# Patient Record
Sex: Female | Born: 1965
Health system: Southern US, Community
[De-identification: ages and names within clinical notes are randomized; demographics above are authoritative.]

## PROBLEM LIST (undated history)

## (undated) DIAGNOSIS — E119 Type 2 diabetes mellitus without complications: Secondary | ICD-10-CM

## (undated) DIAGNOSIS — E7800 Pure hypercholesterolemia, unspecified: Secondary | ICD-10-CM

## (undated) DIAGNOSIS — S82891A Other fracture of right lower leg, initial encounter for closed fracture: Secondary | ICD-10-CM

## (undated) DIAGNOSIS — J45909 Unspecified asthma, uncomplicated: Secondary | ICD-10-CM

## (undated) DIAGNOSIS — F329 Major depressive disorder, single episode, unspecified: Secondary | ICD-10-CM

## (undated) DIAGNOSIS — C50919 Malignant neoplasm of unspecified site of unspecified female breast: Secondary | ICD-10-CM

## (undated) DIAGNOSIS — I10 Essential (primary) hypertension: Secondary | ICD-10-CM

## (undated) DIAGNOSIS — E282 Polycystic ovarian syndrome: Secondary | ICD-10-CM

## (undated) DIAGNOSIS — Z9889 Other specified postprocedural states: Secondary | ICD-10-CM

## (undated) DIAGNOSIS — F419 Anxiety disorder, unspecified: Secondary | ICD-10-CM

## (undated) DIAGNOSIS — Z9079 Acquired absence of other genital organ(s): Secondary | ICD-10-CM

## (undated) DIAGNOSIS — Z87442 Personal history of urinary calculi: Secondary | ICD-10-CM

## (undated) DIAGNOSIS — R928 Other abnormal and inconclusive findings on diagnostic imaging of breast: Secondary | ICD-10-CM

## (undated) DIAGNOSIS — R87619 Unspecified abnormal cytological findings in specimens from cervix uteri: Secondary | ICD-10-CM

## (undated) DIAGNOSIS — J302 Other seasonal allergic rhinitis: Secondary | ICD-10-CM

## (undated) DIAGNOSIS — R112 Nausea with vomiting, unspecified: Secondary | ICD-10-CM

## (undated) DIAGNOSIS — F32A Depression, unspecified: Secondary | ICD-10-CM

## (undated) DIAGNOSIS — C801 Malignant (primary) neoplasm, unspecified: Secondary | ICD-10-CM

## (undated) DIAGNOSIS — S82839A Other fracture of upper and lower end of unspecified fibula, initial encounter for closed fracture: Secondary | ICD-10-CM

## (undated) DIAGNOSIS — Z923 Personal history of irradiation: Secondary | ICD-10-CM

## (undated) DIAGNOSIS — G4730 Sleep apnea, unspecified: Secondary | ICD-10-CM

## (undated) HISTORY — DX: Major depressive disorder, single episode, unspecified: F32.9

## (undated) HISTORY — DX: Unspecified abnormal cytological findings in specimens from cervix uteri: R87.619

## (undated) HISTORY — PX: CERVICAL CERCLAGE: SHX1329

## (undated) HISTORY — PX: BREAST LUMPECTOMY: SHX2

## (undated) HISTORY — PX: CARPAL TUNNEL RELEASE: SHX101

## (undated) HISTORY — DX: Other fracture of upper and lower end of unspecified fibula, initial encounter for closed fracture: S82.839A

## (undated) HISTORY — DX: Depression, unspecified: F32.A

## (undated) HISTORY — DX: Other fracture of right lower leg, initial encounter for closed fracture: S82.891A

## (undated) HISTORY — DX: Anxiety disorder, unspecified: F41.9

---

## 1994-04-01 HISTORY — PX: CHOLECYSTECTOMY, LAPAROSCOPIC: SHX56

## 1999-07-16 ENCOUNTER — Ambulatory Visit (HOSPITAL_COMMUNITY): Admission: RE | Admit: 1999-07-16 | Discharge: 1999-07-16 | Payer: Self-pay | Admitting: *Deleted

## 1999-07-16 ENCOUNTER — Encounter: Payer: Self-pay | Admitting: *Deleted

## 2003-11-08 ENCOUNTER — Other Ambulatory Visit: Admission: RE | Admit: 2003-11-08 | Discharge: 2003-11-08 | Payer: Self-pay | Admitting: *Deleted

## 2003-11-08 ENCOUNTER — Ambulatory Visit (HOSPITAL_COMMUNITY): Admission: RE | Admit: 2003-11-08 | Discharge: 2003-11-08 | Payer: Self-pay | Admitting: *Deleted

## 2004-08-31 ENCOUNTER — Ambulatory Visit (HOSPITAL_COMMUNITY): Admission: RE | Admit: 2004-08-31 | Discharge: 2004-08-31 | Payer: Self-pay | Admitting: Pediatrics

## 2006-04-09 ENCOUNTER — Ambulatory Visit (HOSPITAL_BASED_OUTPATIENT_CLINIC_OR_DEPARTMENT_OTHER): Admission: RE | Admit: 2006-04-09 | Discharge: 2006-04-09 | Payer: Self-pay | Admitting: Orthopedic Surgery

## 2006-05-07 ENCOUNTER — Ambulatory Visit (HOSPITAL_BASED_OUTPATIENT_CLINIC_OR_DEPARTMENT_OTHER): Admission: RE | Admit: 2006-05-07 | Discharge: 2006-05-07 | Payer: Self-pay | Admitting: Orthopedic Surgery

## 2007-07-31 ENCOUNTER — Encounter: Admission: RE | Admit: 2007-07-31 | Discharge: 2007-07-31 | Payer: Self-pay | Admitting: Pediatrics

## 2009-12-06 ENCOUNTER — Encounter: Admission: RE | Admit: 2009-12-06 | Discharge: 2009-12-06 | Payer: Self-pay | Admitting: Pediatrics

## 2010-08-17 NOTE — H&P (Signed)
NAME:  Nicole Khan, Nicole Khan NO.:  [id number]   MEDICAL RECORD NO.:  [id number]                    PATIENT TYPE:   LOCATION:                                       FACILITY:   PHYSICIAN:  Langley Gauss, M.D.                DATE OF BIRTH:   DATE OF ADMISSION:  11/08/2003  DATE OF DISCHARGE:                                HISTORY & PHYSICAL   HISTORY OF PRESENT ILLNESS:  The patient is a 45 year old, gravida 3, para 2-  0-1-2, who contacted our office the a.m. of November 04, 2003, and left a  message with a chief complaint of passing a big clot.  Actually what  happened is she had a very mucousy, stringy discharge x 1 on this a.m., the  consistency of which was described as snotty in its nature.  The patient  apparently has restarted Ortho Tri-Cyclen Lo since February of 2005.  She  takes it on a daily basis, however, during this most recent two months on  two successive weeks she did have one  missed day of pills.  She currently  is on the menstrual week of her pill pack, but would not be expected to  start a normal menses for several days' duration.  The patient does have a  longstanding history of oligomenorrhea and amenorrhea when not on pills,  however, has no prior history of infertility.  The patient recently  restarted the birth control pills in February of 2005 to use now for birth  control purposes.   The patient had been contacted by office staff.  The patient provided  additional history that two days previously she felt as though she had been  leaking urine.  She was wearing a pad all day long, which was saturated.  She denies any continued wetness today.  She denies any fever.  Denies any  vaginal bleeding.  Denies any malodorous discharge.  After the previous  discussion with the patient, she was appropriately given advice to present  today to our office for evaluation.   Additional very recent history is pertinent for being followed in Dr.  Francoise Schaumann. Halm's office.  She has been notified that she had borderline hyperactive  thyroid, however, this is being followed clinically at this time.  No  sonographic or radio nucleotide studies have been performed.  She has  minimal tremors, but does have some occasional palpitations and some heat  intolerance, as well as increased frequency of her migraine headaches.  Pertinent recent history is under the care of Francoise Schaumann. Halm, D.O.  The  patient was started on Mevacor on June 01, 2003, for elevated cholesterol.  The patient is also on Avapro since April 03, 2003.  The patient does have  a longstanding history of chronic hypertension during preceding pregnancy  and was treated with labetalol postpartum and has to this point always been  continued on antihypertensive medication.   REVIEW OF SYSTEMS:  Pertinent for a recent 5-pound weight loss.  The patient  denies any breast tenderness.  She denies any significant nausea.  She  denies any abdominal protuberance.   BIRTH CONTROL:  She has been on the Ortho Tri-Cyclen Lo since February of  2005.  She has most recently only had episodes of a single missed pill day  with promptly doubling up on the following day.  They had previously  considered having another child, but as of February of 2005 when the patient  decided to restart the birth control pills, it was for birth control  purposes.  She and her husband have likewise in the meantime been discussing  tubal ligation versus him having a vasectomy.  The couple is absolutely  certain that they do not want any future childbearing potential.   ALLERGIES:  She has no known drug allergies.   OBSTETRICAL HISTORY:  On March 13, 1988, vaginal delivery at [redacted] weeks  gestation of a 5 pound infant.  That pregnancy had been complicated by  preterm labor.  In June of 1994, the patient underwent delivery at [redacted] weeks  gestation.  The history had been reviewed by myself and it was determined   that it was consistent with incompetent cervix.  Thus when I cared for her  pregnancy delivered on December 02, 1999, a cerclage had been placed at [redacted]  weeks gestation.  This was successful in that the patient progressed to  term, at which time with a footling breech presentation the cerclage was  removed and the patient successfully was delivered a term infant, the child  of which now is doing very well.   SOCIAL HISTORY:  The patient is a nonsmoker.  Her husband is employed by  [employer] in the [employer].   PHYSICAL EXAMINATION:  GENERAL APPEARANCE:  On physical examination, the  patient initially was somewhat apprehensive, stating that she knew that  something was wrong in that she had had breakthrough bleeding previously.  VITAL SIGNS:  The blood pressure is noted to be 143/100.  The pulse is 103.  WEIGHT:  184 pounds.  HEENT:  Negative.  No facial edema.  NECK:  Supple.  The thyroid is not palpable.  No adenopathy.  LUNGS:  Clear.  CARDIOVASCULAR:  Regular rate and rhythm.  No murmurs or gallops are  appreciated.  Minimal pretibial edema.  ABDOMEN:  Obese.  Panniculus is present.  Laparoscopic cholecystectomy scar  is noted, as well as Pfannenstiel incision from C-section delivery.  PELVIC:  Normal external genitalia.  No active bleeding, leakage of fluid,  or discharge is identified.  Sterile speculum examination is performed which  reveals some mucousy-appearing tissue at the endocervical os.  The cervix  itself appears to be dilated 1 cm.  With the patient still in dorsolithotomy  position, a sponge stick is requested.  Gentle manipulation of this material  reveals it to be a portion of a prolapsed umbilical cord.  No pulsations  identified.  Just above this is known to be obvious fetal foot presenting.  No bleeding or leakage of fluid is noted.  Cervix visually dilated 1 cm.  ADDITIONAL STUDIES:  Transabdominal ultrasound:  Greater than [redacted] weeks   gestation.  Limited study is then performed and interpreted by Langley Gauss, M.D.  History of incompetent cervix, preterm premature spontaneous  rupture of membranes, and missed second trimester abortion.  Transabdominal  ultrasound reveals a double  footling breech presentation.  No amniotic fluid  is visualized around the infant.  No fetal movement is identified.  No fetal  cardiac activity is identified.  There was noted to be a posterior placenta.  Anatomic survey is not performed.  Additionally, it is not possible due to  the diminished amniotic fluid.  Parameters obtained reveal abdominal  circumference 15 weeks and 3 days, DPD 15 weeks.  Longitudinal study of the  spine is also performed utilizing M-mode.  There is complete absence of any  fetal cardiac activity.   ASSESSMENT AND PLAN:  The patient presents today with a history of a  negative home pregnancy test in June.  However, by dating criteria based on  today's ultrasound the patient should have conceived about mid May and  likely had a positive pregnancy test about mid June.  She did start the  Mevacor on June 01, 2003, which is a category X drug, but this would have  been prior to clinical signs or symptoms of a pregnancy.  Additionally, the  patient states a serum pregnancy test had been performed at that time, which  was noted to be negative.  Additionally, the major impetus for starting the  birth control pills at the visit of May 04, 2003, is with the knowledge  that she would be starting Mevacor the following month and she realized it  was a category X drug.  The patient now presents with a history that is very  consistent with incompetent cervix with premature dilatation.  Range of  motion occurred two days previously.  At an indeterminate point in time  there ceased to be any fetal cardiac activity occurring.  Thus, at this  point in time, the patient will require evacuation of uterine contents.  As  she is  noted to have a history of incompetent cervix, the cervix is now at  this point noted to be 1 cm dilated.  The uterine contents could likely be  evacuated with minimal morbidity in the operating room utilizing general  anesthesia.  Some dilatation of the cervix may be required, after which time  this nonviable second trimester abortus can be removed and sent to pathology  laboratory.  The patient and husband are fully aware that at this fetal size  and the fact it is a second trimester abortion there will not be a fetus  produced for viewing nor would any formal burial services be indicated.  If,  however, they did desire an intact fetus to result, I offered them medical  induction of labor utilizing prostaglandin.  However, they wished to proceed  with removal of the pregnancy in the operating room with the specimen then  being disposed of after evaluation by the pathology laboratory department.  I had discussed with the patient on a previous occasion regarding family  planning.  She states that both she and her husband even prior to today's  date were very adamant regarding desire for no future children.  It had been  decided that he would have a vasectomy performed, but was uncertain of where  to have this done in the [place], [place], area.  However, it was  very clear that they do desire no future childbearing potential prior to  this event.  Thus, on an outpatient basis on November 08, 2003, utilizing  general anesthesia, we will proceed with dilatation and removal of the  products of conception.  Subsequently can proceed with laparoscopic tubal  ligation utilizing bipolar cautery.  The risks and benefits of the procedure  were discussed with the patient, including the risks of anesthesia and the  risks of hemorrhage or infection associated with the laparoscopy and tubal  ligation.  They are fully aware that there are reversible forms of birth  control available, but at this  time are very certain regarding their desire  for permanent and irreversible sterilization.     ___________________________________________                                         Langley Gauss, M.D.   DC/MEDQ  D:  11/07/2003  T:  11/07/2003  Job:  [id number]

## 2010-08-17 NOTE — Op Note (Signed)
NAME:  Nicole Khan, Nicole Khan NO.:  [id number]   MEDICAL RECORD NO.:  [id number]          PATIENT TYPE:  AMB   LOCATION:  DSC                          FACILITY:  [facility]   PHYSICIAN:  Loreta Ave, M.D. DATE OF BIRTH:  January 27, 1966   DATE OF PROCEDURE:  05/07/2006  DATE OF DISCHARGE:                               OPERATIVE REPORT   PREOPERATIVE DIAGNOSIS:  Left carpal tunnel syndrome.   POSTOPERATIVE DIAGNOSIS:  Left carpal tunnel syndrome.   PROCEDURE:  Left carpal tunnel release.   SURGEON:  Loreta Ave, M.D.   ASSISTANT:  Genene Churn. Denton Meek.   ANESTHESIA:  IV regional.   SPECIMENS:  None.   CULTURES:  None.   COMPLICATIONS:  None.   DRESSING:  Soft compressive with well-padded short-arm splint.   DESCRIPTION OF PROCEDURE:  The patient was brought to the operating room  and placed on the operating table in supine position.  After adequate  anesthesia had been obtained, the left arm was prepped and draped in the  usual sterile fashion.  A small incision was made over the carpal  tunnel, slightly curved, extending slightly ulnar-ward at the distal  wrist crease.  The skin and subcutaneous tissue were divided.  The  retinaculum over the carpal tunnel was identified and incised under  direct visualization from the forearm fascia proximally to the palmar  arch distally, while protecting the nerve below.  Moderate-to-marked  constriction of the nerve improved after carpal tunnel release, as well  as simple neurotomy.  Digital branches and motor branches were  identified, protected and decompressed.  No other abnormalities were  seen in the canal.  The wound was irrigated.  The skin was closed with  interrupted mattress nylon suture.  Sterile compression dressing  applied.  Short-arm splint applied.  Anesthesia reversed.  Brought to  the recovery room.  Tolerated surgery well with no complications.      Loreta Ave, M.D.  Electronically  Signed     DFM/MEDQ  D:  05/07/2006  T:  05/07/2006  Job:  [id number]

## 2010-08-17 NOTE — Op Note (Signed)
NAMEMarland Khan  ZYAIR, RHEIN             ACCOUNT NO.:  [id number]   MEDICAL RECORD NO.:  [id number]          PATIENT TYPE:  AMB   LOCATION:  DSC                          FACILITY:  [facility]   PHYSICIAN:  Loreta Ave, M.D. DATE OF BIRTH:  May 28, 1965   DATE OF PROCEDURE:  04/09/2006  DATE OF DISCHARGE:                               OPERATIVE REPORT   PREOPERATIVE DIAGNOSIS:  Right carpal tunnel syndrome.   POSTOPERATIVE DIAGNOSIS:  Right carpal tunnel syndrome.   PROCEDURE:  Right carpal tunnel release.   SURGEON:  Loreta Ave, M.D.   ASSISTANT:  Genene Churn. Denton Meek.   ANESTHESIA:  IV regional.   SPECIMENS:  None.   CULTURES:  None.   COMPLICATIONS:  None.   DRESSING:  Self-compressive splint.   PROCEDURE:  The patient was brought to the operating room and placed on  the operating table in the supine position.  After adequate anesthesia  had been obtained, prepped and draped in the usual, sterile fashion.  A  small curved incision over the carpal tunnel heading slightly ulnarward  at the distal wrist crease.  The skin and subcutaneous tissue divided  avoiding injury to the palmar branch of the median nerve.  Retinaculum  over the carpal tunnel was then released in its entirety from the  forearm fascia proximally to the palmar arch distally.  The median nerve  was identified.  Completely decompressed throughout.  Digital branches,  motor branches identified, protected, decompressed.  Moderate to marked  hourglass constriction of the nerve, which improved somewhat after  carpal tunnel release with epineurotomy.  Wound irrigated.  Skin closed  with nylon.  The wound was injected with Marcaine without epinephrine.  A sterile compressive dressing and short arm splint applied.  Anesthesia  reversed.  Brought to recovery room.  Tolerated the surgery well.  No  complications.      Loreta Ave, M.D.  Electronically Signed     DFM/MEDQ  D:  04/09/2006  T:  04/10/2006   Job:  [id number]

## 2010-08-17 NOTE — H&P (Signed)
NAME:  Nicole Khan, Nicole Khan NO.:  [id number]   MEDICAL RECORD NO.:  [id number]                  PATIENT TYPE:   LOCATION:                                       FACILITY:   PHYSICIAN:  Langley Gauss, M.D.                DATE OF BIRTH:   DATE OF ADMISSION:  DATE OF DISCHARGE:                                HISTORY & PHYSICAL   OFFICE PROGRESS NOTE   HISTORY AND PHYSICAL:  See previously dictated H&P.   PROCEDURE:  Planned procedure on today's date of service, November 08, 2003,  was that for uterine evacuation of a 15 week, non-viable fetus and  performance of laparoscopic tubal ligation by bipolar cautery.   HISTORY OF PRESENT ILLNESS:  The patient contacted the hospital at 0830 on  November 08, 2003 stating she was having some problems and desired a return  call. At that point in time, I was performing a surgical procedure. When I  contacted the patient a.m. on November 08, 2003, the patient notified me that  she had passed the 15 week fetus at home and her husband had placed it in a  bag. She was not having any significant pelvic cramping nor was she having  any significant vaginal bleeding at that time. The patient was advised to  present to the office for immediate evaluation.   HOSPITAL COURSE:  On examination, the patient was noted to be obviously  distraught but no significant pain or discomfort appreciated. Pelvic  examination performed. No leakage of fluid and no vaginal bleeding is  identified. Sterile speculum examination is done. Cervix appears to be about  2 cm dilated. Visible is a portion of umbilical cord. This is grasped with a  sponge stick. Very gentle traction is then applied. By utilizing a second  sponge stick, I was able to gently manipulate and allow spontaneous  separation of this portion of remaining umbilical cord as well as what  appeared to be an intact placenta. The patient had onset of moderate  bleeding immediately following  this, which required bi-manual compression of  the uterus. Methergine is contraindicated due to her history of  hypertension. Clots were evacuated from the vaginal vault but primarily  through the fundal massage, uterine tone was achieved with marked decrease  in the bleeding. The patient at one point in time, due to the limited though  rapid blood loss, felt cool and clammy. Thus she was placed in Trendelenburg  position at that time and monitored carefully for renewed bleeding. No  significant heavy vaginal bleeding was noted to occur. Thus, it was not  necessary to send the patient to the hospital. We were prepared to refer the  patient to [facility] for intravenous fluids, as well as  intravenous Pitocin solution. After stabilization and assessment of blood  pressure which was 123/80 with a pulse of 108, respiratory rate of 20,  discussed with the  patient and it was elected at that time to cancel  performance of the permanent sterilization procedure. Due to the prolonged  rupture of membranes, now of 72 hours of duration, the patient was treated  prophylactically with Rocephin 250 mg IM x1. She will followup in the office  in 1 weeks time. Will likely at that time, initiate oral contraceptives for  birth control purposes. Discussed with the husband that he may wish to  proceed with performance of the vasectomy procedure for birth control  purposes. The couple is absolutely certain that they are not desirous of a  future pregnancy, nor was this a planned pregnancy.   PROCEDURE:  Thus on today's date of service, procedure performed was vaginal  delivery of a placenta, 15 week non-viable fetus brought in by the family  and was briefly examined. There was noted to be a small amount of odor but  overall, external appearance was within normal limits. Appeared to be a female  fetus. Placenta likewise was noted to appear to be intact. The placenta had  been discarded during the  procedure in the office, as we were monitoring the  patient very carefully for passage of clots and at the same time, trying to  minimize the emotional trauma by keeping the room very clean. The fetus,  however, has been sent to [facility] laboratory for a pathologic  evaluation only.     ___________________________________________                                         Langley Gauss, M.D.   DC/MEDQ  D:  11/09/2003  T:  11/09/2003  Job:  [id number]

## 2011-01-25 ENCOUNTER — Other Ambulatory Visit: Payer: Self-pay | Admitting: Obstetrics and Gynecology

## 2011-01-25 DIAGNOSIS — Z1231 Encounter for screening mammogram for malignant neoplasm of breast: Secondary | ICD-10-CM

## 2011-02-26 ENCOUNTER — Ambulatory Visit
Admission: RE | Admit: 2011-02-26 | Discharge: 2011-02-26 | Disposition: A | Discharge status: Home / Self Care | Payer: Commercial Managed Care - PPO | Source: Ambulatory Visit | Attending: Obstetrics and Gynecology | Admitting: Obstetrics and Gynecology

## 2011-02-26 DIAGNOSIS — Z1231 Encounter for screening mammogram for malignant neoplasm of breast: Secondary | ICD-10-CM

## 2012-02-17 ENCOUNTER — Other Ambulatory Visit: Payer: Self-pay | Admitting: Obstetrics and Gynecology

## 2012-02-17 DIAGNOSIS — Z1231 Encounter for screening mammogram for malignant neoplasm of breast: Secondary | ICD-10-CM

## 2012-02-28 ENCOUNTER — Ambulatory Visit
Admission: RE | Admit: 2012-02-28 | Discharge: 2012-02-28 | Disposition: A | Discharge status: Home / Self Care | Payer: Commercial Managed Care - PPO | Source: Ambulatory Visit | Attending: Obstetrics and Gynecology | Admitting: Obstetrics and Gynecology

## 2012-02-28 DIAGNOSIS — Z1231 Encounter for screening mammogram for malignant neoplasm of breast: Secondary | ICD-10-CM

## 2012-03-03 ENCOUNTER — Other Ambulatory Visit: Payer: Self-pay | Admitting: Obstetrics and Gynecology

## 2012-03-03 DIAGNOSIS — R928 Other abnormal and inconclusive findings on diagnostic imaging of breast: Secondary | ICD-10-CM

## 2012-03-16 ENCOUNTER — Ambulatory Visit
Admission: RE | Admit: 2012-03-16 | Discharge: 2012-03-16 | Disposition: A | Discharge status: Home / Self Care | Payer: Commercial Managed Care - PPO | Source: Ambulatory Visit | Attending: Obstetrics and Gynecology | Admitting: Obstetrics and Gynecology

## 2012-03-16 DIAGNOSIS — R928 Other abnormal and inconclusive findings on diagnostic imaging of breast: Secondary | ICD-10-CM

## 2012-04-02 LAB — HM PAP SMEAR: HM Pap smear: NEGATIVE

## 2013-02-11 ENCOUNTER — Emergency Department (HOSPITAL_COMMUNITY): Payer: 59

## 2013-02-11 ENCOUNTER — Emergency Department (HOSPITAL_COMMUNITY)
Admission: EM | Admit: 2013-02-11 | Discharge: 2013-02-11 | Disposition: A | Discharge status: Home / Self Care | Payer: 59 | Attending: Emergency Medicine | Admitting: Emergency Medicine

## 2013-02-11 ENCOUNTER — Encounter (HOSPITAL_COMMUNITY): Payer: Self-pay | Admitting: Emergency Medicine

## 2013-02-11 DIAGNOSIS — Z8742 Personal history of other diseases of the female genital tract: Secondary | ICD-10-CM | POA: Insufficient documentation

## 2013-02-11 DIAGNOSIS — Z79899 Other long term (current) drug therapy: Secondary | ICD-10-CM | POA: Insufficient documentation

## 2013-02-11 DIAGNOSIS — E119 Type 2 diabetes mellitus without complications: Secondary | ICD-10-CM | POA: Insufficient documentation

## 2013-02-11 DIAGNOSIS — R110 Nausea: Secondary | ICD-10-CM | POA: Insufficient documentation

## 2013-02-11 DIAGNOSIS — N200 Calculus of kidney: Secondary | ICD-10-CM | POA: Insufficient documentation

## 2013-02-11 DIAGNOSIS — I10 Essential (primary) hypertension: Secondary | ICD-10-CM | POA: Insufficient documentation

## 2013-02-11 DIAGNOSIS — E7800 Pure hypercholesterolemia, unspecified: Secondary | ICD-10-CM | POA: Insufficient documentation

## 2013-02-11 HISTORY — DX: Pure hypercholesterolemia, unspecified: E78.00

## 2013-02-11 HISTORY — DX: Essential (primary) hypertension: I10

## 2013-02-11 HISTORY — DX: Polycystic ovarian syndrome: E28.2

## 2013-02-11 HISTORY — DX: Type 2 diabetes mellitus without complications: E11.9

## 2013-02-11 LAB — URINALYSIS, ROUTINE W REFLEX MICROSCOPIC
Bilirubin Urine: NEGATIVE
Glucose, UA: NEGATIVE mg/dL
Ketones, ur: NEGATIVE mg/dL
Leukocytes, UA: NEGATIVE
Nitrite: NEGATIVE
Protein, ur: NEGATIVE mg/dL
Specific Gravity, Urine: 1.025 (ref 1.005–1.030)
Urobilinogen, UA: 0.2 mg/dL (ref 0.0–1.0)
pH: 6 (ref 5.0–8.0)

## 2013-02-11 LAB — BASIC METABOLIC PANEL
BUN: 15 mg/dL (ref 6–23)
CO2: 24 mEq/L (ref 19–32)
Calcium: 9.5 mg/dL (ref 8.4–10.5)
Chloride: 100 mEq/L (ref 96–112)
Creatinine, Ser: 0.97 mg/dL (ref 0.50–1.10)
GFR calc Af Amer: 81 mL/min — ABNORMAL LOW (ref >90)
GFR calc non Af Amer: 69 mL/min — ABNORMAL LOW (ref >90)
Glucose, Bld: 105 mg/dL — ABNORMAL HIGH (ref 70–99)
Potassium: 4.5 mEq/L (ref 3.5–5.1)
Sodium: 136 mEq/L (ref 135–145)

## 2013-02-11 LAB — CBC WITH DIFFERENTIAL/PLATELET
Basophils Absolute: 0 10*3/uL (ref 0.0–0.1)
Basophils Relative: 0 % (ref 0–1)
Eosinophils Absolute: 0.2 10*3/uL (ref 0.0–0.7)
Eosinophils Relative: 1 % (ref 0–5)
HCT: 42.8 % (ref 36.0–46.0)
Hemoglobin: 14.3 g/dL (ref 12.0–15.0)
Lymphocytes Relative: 14 % (ref 12–46)
Lymphs Abs: 2.1 10*3/uL (ref 0.7–4.0)
MCH: 31.5 pg (ref 26.0–34.0)
MCHC: 33.4 g/dL (ref 30.0–36.0)
MCV: 94.3 fL (ref 78.0–100.0)
Monocytes Absolute: 0.7 10*3/uL (ref 0.1–1.0)
Monocytes Relative: 5 % (ref 3–12)
Neutro Abs: 11.6 10*3/uL — ABNORMAL HIGH (ref 1.7–7.7)
Neutrophils Relative %: 80 % — ABNORMAL HIGH (ref 43–77)
Platelets: 341 10*3/uL (ref 150–400)
RBC: 4.54 MIL/uL (ref 3.87–5.11)
RDW: 12.7 % (ref 11.5–15.5)
WBC: 14.4 10*3/uL — ABNORMAL HIGH (ref 4.0–10.5)

## 2013-02-11 LAB — URINE MICROSCOPIC-ADD ON

## 2013-02-11 MED ORDER — KETOROLAC TROMETHAMINE 30 MG/ML IJ SOLN
30.0000 mg | Freq: Once | INTRAMUSCULAR | Status: AC
  Administered 2013-02-11: 30 mg via INTRAVENOUS
  Filled 2013-02-11: qty 1

## 2013-02-11 MED ORDER — OXYCODONE-ACETAMINOPHEN 5-325 MG PO TABS: 1.0000 | ORAL_TABLET | ORAL | Status: DC | PRN

## 2013-02-11 MED ORDER — TAMSULOSIN HCL 0.4 MG PO CAPS: 0.4000 mg | ORAL_CAPSULE | Freq: Every day | ORAL | Status: DC

## 2013-02-11 MED ORDER — SODIUM CHLORIDE 0.9 % IV SOLN
INTRAVENOUS | Status: AC
  Administered 2013-02-11: 13:00:00 via INTRAVENOUS

## 2013-02-11 MED ORDER — ONDANSETRON HCL 4 MG/2ML IJ SOLN
4.0000 mg | Freq: Once | INTRAMUSCULAR | Status: AC
  Administered 2013-02-11: 4 mg via INTRAVENOUS
  Filled 2013-02-11: qty 2

## 2013-02-11 MED ORDER — HYDROMORPHONE HCL PF 1 MG/ML IJ SOLN
1.0000 mg | Freq: Once | INTRAMUSCULAR | Status: AC
  Administered 2013-02-11: 1 mg via INTRAVENOUS
  Filled 2013-02-11: qty 1

## 2013-02-11 NOTE — ED Provider Notes (Signed)
CSN: [id number]     Arrival date & time 02/11/13  1202 History   First MD Initiated Contact with Patient 02/11/13 1253     Chief Complaint  Patient presents with  . Flank Pain   (Consider location/radiation/quality/duration/timing/severity/associated sxs/prior Treatment) Patient is a 47 y.o. female presenting with flank pain. The history is provided by the patient.  Flank Pain This is a new problem. The current episode started today. The problem occurs constantly. The problem has been gradually worsening. Associated symptoms include abdominal pain and nausea. Pertinent negatives include no chest pain, chills, fever, headaches, rash or vomiting. Nothing aggravates the symptoms. She has tried walking, lying down and NSAIDs for the symptoms. The treatment provided no relief.   Nicole Khan is a 47 y.o. female who presents to the ED with left flank pain that started approximately 2 hours prior to arrival to the ED. She has nausea but has not vomited. The pain started and would come and go but now is sharp and is constant. Patient  has had kidney stones in the past but able to pass them on her own at home. This time pain so bad she had to come to the ED.   Past Medical History  Diagnosis Date  . Diabetes mellitus without complication   . Hypertension   . Polycystic ovary disease   . Hypercholesteremia    Past Surgical History  Procedure Laterality Date  . Cholecystectomy    . Carpal tunnel release    . Cesarean section     History reviewed. No pertinent family history. History  Substance Use Topics  . Smoking status: Never Smoker   . Smokeless tobacco: Not on file  . Alcohol Use: No   OB History   Grav Para Term Preterm Abortions TAB SAB Ect Mult Living                 Review of Systems  Constitutional: Negative for fever and chills.  HENT: Negative.   Eyes: Negative for visual disturbance.  Respiratory: Negative for shortness of breath.   Cardiovascular: Negative for  chest pain.  Gastrointestinal: Positive for nausea and abdominal pain. Negative for vomiting.  Genitourinary: Positive for flank pain.  Musculoskeletal: Positive for back pain.  Skin: Negative for rash.  Allergic/Immunologic: Negative for immunocompromised state.  Neurological: Negative for dizziness and headaches.  Psychiatric/Behavioral: The patient is not nervous/anxious.     Allergies  Hazel tree pollen and Soy allergy  Home Medications   Current Outpatient Rx  Name  Route  Sig  Dispense  Refill  . buPROPion (WELLBUTRIN XL) 300 MG 24 hr tablet   Oral   Take 300 mg by mouth daily.         . citalopram (CELEXA) 20 MG tablet   Oral   Take 20 mg by mouth daily.         Marland Kitchen loratadine (CLARITIN) 10 MG tablet   Oral   Take 10 mg by mouth daily.         Marland Kitchen lovastatin (MEVACOR) 40 MG tablet   Oral   Take 40 mg by mouth at bedtime.         . metFORMIN (GLUCOPHAGE-XR) 500 MG 24 hr tablet   Oral   Take 500 mg by mouth 2 (two) times daily.         . metoprolol (LOPRESSOR) 100 MG tablet   Oral   Take 100 mg by mouth 2 (two) times daily.         Marland Kitchen  norethindrone-ethinyl estradiol (JUNEL FE,GILDESS FE,LOESTRIN FE) 1-20 MG-MCG tablet   Oral   Take 1 tablet by mouth at bedtime.         Marland Kitchen spironolactone (ALDACTONE) 100 MG tablet   Oral   Take 100 mg by mouth 2 (two) times daily.          BP 126/82  Pulse 75  Temp(Src) 97.2 F (36.2 C) (Oral)  Resp 16  Ht 5\' 2"  (1.575 m)  Wt 192 lb (87.091 kg)  BMI 35.11 kg/m2  SpO2 96% Physical Exam  Nursing note and vitals reviewed. Constitutional: She is oriented to person, place, and time. She appears well-developed and well-nourished.  HENT:  Head: Normocephalic and atraumatic.  Eyes: EOM are normal.  Neck: Neck supple.  Pulmonary/Chest: Effort normal.  Abdominal: Soft. Bowel sounds are normal. There is tenderness in the suprapubic area and left lower quadrant.  Left flank pain  Musculoskeletal: Normal range of  motion.  Neurological: She is alert and oriented to person, place, and time. No cranial nerve deficit.  Skin: Skin is warm and dry.  Psychiatric: She has a normal mood and affect. Her behavior is normal.    Results for orders placed during the hospital encounter of 02/11/13 (from the past 24 hour(s))  URINALYSIS, ROUTINE W REFLEX MICROSCOPIC     Status: Abnormal   Collection Time    02/11/13 12:43 PM      Result Value Range   Color, Urine YELLOW  YELLOW   APPearance CLEAR  CLEAR   Specific Gravity, Urine 1.025  1.005 - 1.030   pH 6.0  5.0 - 8.0   Glucose, UA NEGATIVE  NEGATIVE mg/dL   Hgb urine dipstick TRACE (*) NEGATIVE   Bilirubin Urine NEGATIVE  NEGATIVE   Ketones, ur NEGATIVE  NEGATIVE mg/dL   Protein, ur NEGATIVE  NEGATIVE mg/dL   Urobilinogen, UA 0.2  0.0 - 1.0 mg/dL   Nitrite NEGATIVE  NEGATIVE   Leukocytes, UA NEGATIVE  NEGATIVE  URINE MICROSCOPIC-ADD ON     Status: Abnormal   Collection Time    02/11/13 12:43 PM      Result Value Range   Squamous Epithelial / LPF FEW (*) RARE   WBC, UA 0-2  <3 WBC/hpf   RBC / HPF 0-2  <3 RBC/hpf   Bacteria, UA RARE  RARE  CBC WITH DIFFERENTIAL     Status: Abnormal   Collection Time    02/11/13  1:31 PM      Result Value Range   WBC 14.4 (*) 4.0 - 10.5 K/uL   RBC 4.54  3.87 - 5.11 MIL/uL   Hemoglobin 14.3  12.0 - 15.0 g/dL   HCT 16.1  09.6 - 04.5 %   MCV 94.3  78.0 - 100.0 fL   MCH 31.5  26.0 - 34.0 pg   MCHC 33.4  30.0 - 36.0 g/dL   RDW 40.9  81.1 - 91.4 %   Platelets 341  150 - 400 K/uL   Neutrophils Relative % 80 (*) 43 - 77 %   Neutro Abs 11.6 (*) 1.7 - 7.7 K/uL   Lymphocytes Relative 14  12 - 46 %   Lymphs Abs 2.1  0.7 - 4.0 K/uL   Monocytes Relative 5  3 - 12 %   Monocytes Absolute 0.7  0.1 - 1.0 K/uL   Eosinophils Relative 1  0 - 5 %   Eosinophils Absolute 0.2  0.0 - 0.7 K/uL   Basophils Relative 0  0 -  1 %   Basophils Absolute 0.0  0.0 - 0.1 K/uL  BASIC METABOLIC PANEL     Status: Abnormal   Collection Time     02/11/13  1:31 PM      Result Value Range   Sodium 136  135 - 145 mEq/L   Potassium 4.5  3.5 - 5.1 mEq/L   Chloride 100  96 - 112 mEq/L   CO2 24  19 - 32 mEq/L   Glucose, Bld 105 (*) 70 - 99 mg/dL   BUN 15  6 - 23 mg/dL   Creatinine, Ser 4.09  0.50 - 1.10 mg/dL   Calcium 9.5  8.4 - 81.1 mg/dL   GFR calc non Af Amer 69 (*) >90 mL/min   GFR calc Af Amer 81 (*) >90 mL/min     ED Course  Procedures Labs, CT scan IV hydration with NSS, Zofran 4 mg. IV, Toradol 30 mg. IV, Dilaudid 1 mg. IV MDM  Care turned over to [facility] @ 5:08 pm Patient awaiting imagine.     Janne Napoleon, NP 02/11/13 [phone]

## 2013-02-11 NOTE — ED Notes (Signed)
Lt flank pain for 2 hours  With nausea.

## 2013-02-11 NOTE — ED Notes (Signed)
Pt with left flank pain, states has hx of kidney stones but has never been treated for them

## 2013-02-11 NOTE — ED Notes (Signed)
US being done at bedside

## 2013-02-12 NOTE — ED Provider Notes (Signed)
Medical screening examination/treatment/procedure(s) were performed by non-physician practitioner and as supervising physician I was immediately available for consultation/collaboration.  EKG Interpretation   None       Taraji Mungo, MD, FACEP   Basia Mcginty L Brailee Riede, MD 02/12/13 1450 

## 2013-04-06 ENCOUNTER — Encounter: Payer: Self-pay | Admitting: Nurse Practitioner

## 2013-04-07 ENCOUNTER — Ambulatory Visit (INDEPENDENT_AMBULATORY_CARE_PROVIDER_SITE_OTHER): Payer: 59 | Admitting: Nurse Practitioner

## 2013-04-07 ENCOUNTER — Other Ambulatory Visit: Payer: Self-pay

## 2013-04-07 ENCOUNTER — Encounter: Payer: Self-pay | Admitting: Nurse Practitioner

## 2013-04-07 VITALS — BP 108/66 | HR 60 | Ht 62.25 in | Wt 195.0 lb

## 2013-04-07 DIAGNOSIS — F4323 Adjustment disorder with mixed anxiety and depressed mood: Secondary | ICD-10-CM

## 2013-04-07 DIAGNOSIS — N200 Calculus of kidney: Secondary | ICD-10-CM

## 2013-04-07 DIAGNOSIS — Z1231 Encounter for screening mammogram for malignant neoplasm of breast: Secondary | ICD-10-CM

## 2013-04-07 DIAGNOSIS — E282 Polycystic ovarian syndrome: Secondary | ICD-10-CM | POA: Insufficient documentation

## 2013-04-07 DIAGNOSIS — E119 Type 2 diabetes mellitus without complications: Secondary | ICD-10-CM

## 2013-04-07 DIAGNOSIS — E1159 Type 2 diabetes mellitus with other circulatory complications: Secondary | ICD-10-CM | POA: Insufficient documentation

## 2013-04-07 DIAGNOSIS — R319 Hematuria, unspecified: Secondary | ICD-10-CM

## 2013-04-07 DIAGNOSIS — Z01419 Encounter for gynecological examination (general) (routine) without abnormal findings: Secondary | ICD-10-CM

## 2013-04-07 DIAGNOSIS — Z0000 Encounter for general adult medical examination without abnormal findings: Secondary | ICD-10-CM

## 2013-04-07 DIAGNOSIS — I10 Essential (primary) hypertension: Secondary | ICD-10-CM

## 2013-04-07 LAB — POCT URINALYSIS DIPSTICK
Bilirubin, UA: NEGATIVE
Glucose, UA: NEGATIVE
Ketones, UA: NEGATIVE
Nitrite, UA: NEGATIVE
Protein, UA: NEGATIVE
Urobilinogen, UA: NEGATIVE
pH, UA: 5

## 2013-04-07 MED ORDER — NORETHINDRONE 0.35 MG PO TABS: 1.0000 | ORAL_TABLET | Freq: Every day | ORAL | Status: DC

## 2013-04-07 NOTE — Patient Instructions (Signed)

## 2013-04-07 NOTE — Progress Notes (Signed)
Patient ID: Nicole Khan, female   DOB: 1965-12-08, 48 y.o.   MRN: [id number] 48 y.o. [id number] Married part Asian Fe here for annual exam.  Recent flare of renal calculi in November and again Christmas Eve.  Today some RBC in urine - having no pain today.  On POP usually no menses - last spotting was 09/2012.   Patient's last menstrual period was 09/29/2012.          Sexually active: yes  The current method of family planning is husband with vasectomy,  POP (progesterone only) for treatment of PCOS.    Exercising: no  The patient does not participate in regular exercise at present. Smoker:  no  Health Maintenance: Pap:  04/02/12, ASCUS, neg HR HPV MMG:  02/28/12, right ultrasound 03/16/12, cysts, Bi-Rads 2: negative, repeat in one year -  due now and will schedule TDaP:  ?? Labs:  endocrinology Urine:  Trace RBC, trace WBC, pH 5.0   reports that she has never smoked. She has never used smokeless tobacco. She reports that she does not drink alcohol or use illicit drugs.  Past Medical History  Diagnosis Date  . Diabetes mellitus without complication   . Hypertension   . Polycystic ovary disease   . Hypercholesteremia   . Anxiety   . Depression     Past Surgical History  Procedure Laterality Date  . Carpal tunnel release    . Cesarean section    . Cervical cerclage  07/1991; 05/1999    x 2  . Cholecystectomy, laparoscopic  1996    Current Outpatient Prescriptions  Medication Sig Dispense Refill  . buPROPion (WELLBUTRIN XL) 300 MG 24 hr tablet Take 300 mg by mouth daily.      . citalopram (CELEXA) 20 MG tablet Take 20 mg by mouth daily.      Marland Kitchen loratadine (CLARITIN) 10 MG tablet Take 10 mg by mouth daily.      Marland Kitchen lovastatin (MEVACOR) 40 MG tablet Take 40 mg by mouth at bedtime.      . metFORMIN (GLUCOPHAGE-XR) 500 MG 24 hr tablet Take 1,000 mg by mouth 2 (two) times daily.       . metoprolol (LOPRESSOR) 100 MG tablet Take 100 mg by mouth 2 (two) times daily.      . norethindrone  (MICRONOR,CAMILA,ERRIN) 0.35 MG tablet Take 1 tablet (0.35 mg total) by mouth daily.  3 Package  3  . oxyCODONE-acetaminophen (ROXICET) 5-325 MG per tablet Take 1 tablet by mouth every 4 (four) hours as needed for severe pain.  20 tablet  0  . spironolactone (ALDACTONE) 100 MG tablet Take 100 mg by mouth 2 (two) times daily.       No current facility-administered medications for this visit.    Family History  Problem Relation Age of Onset  . Hypertension Mother   . Infertility Mother   . Deep vein thrombosis Mother   . Anxiety disorder Mother   . Depression Mother   . Hepatitis C Mother     blood transfusion  . Aneurysm Mother     [place]  . Cancer Father     gallbladder, renal  . Hypertension Father   . Anxiety disorder Father   . Depression Father   . Multiple births Sister   . Cancer Sister     thyroid cancer  . Thyroid disease Sister   . Anxiety disorder Sister   . Depression Sister   . Hypertension Brother   . Anxiety disorder Brother   .  Depression Brother     ROS:  Pertinent items are noted in HPI.  Otherwise, a comprehensive ROS was negative.  Exam:   BP 108/66  Pulse 60  Ht 5' 2.25" (1.581 m)  Wt 195 lb (88.451 kg)  BMI 35.39 kg/m2  LMP 09/29/2012 Height: 5' 2.25" (158.1 cm)  Ht Readings from Last 3 Encounters:  04/07/13 5' 2.25" (1.581 m)  02/11/13 5\' 2"  (1.575 m)    General appearance: alert, cooperative and appears stated age Head: Normocephalic, without obvious abnormality, atraumatic Neck: no adenopathy, supple, symmetrical, trachea midline and thyroid normal to inspection and palpation Lungs: clear to auscultation bilaterally Breasts: normal appearance, no masses or tenderness Heart: regular rate and rhythm Abdomen: soft, non-tender; no masses,  no organomegaly Extremities: extremities normal, atraumatic, no cyanosis or edema Skin: Skin color, texture, turgor normal. No rashes or lesions Lymph nodes: Cervical, supraclavicular, and axillary  nodes normal. No abnormal inguinal nodes palpated Neurologic: Grossly normal   Pelvic: External genitalia:  no lesions              Urethra:  normal appearing urethra with no masses, tenderness or lesions              Bartholin's and Skene's: normal                 Vagina: normal appearing vagina with normal color and discharge, no lesions              Cervix: anteverted              Pap taken: yes Bimanual Exam:  Uterus:  normal size, contour, position, consistency, mobility, non-tender              Adnexa: no mass, fullness, tenderness               Rectovaginal: Confirms               Anus:  normal sphincter tone, no lesions  A:  Well Woman with normal exam  History of PCOS   History of disordered proliferative endometrium 04/2010 on POP  History of situational stressors - doing well on med's  History of DM, renal calculi, HTN  Recent weight loss of 20 lbs since being followed by Endocrinologist  P:   Pap smear as per guidelines   Mammogram due now and will schedule  Refill on POP   Counseled on breast self exam, mammography screening, adequate intake of calcium and vitamin D, diet and exercise return annually or prn  An After Visit Summary was printed and given to the patient.

## 2013-04-08 LAB — IPS PAP TEST WITH REFLEX TO HPV

## 2013-04-09 ENCOUNTER — Other Ambulatory Visit: Payer: Self-pay | Admitting: Certified Nurse Midwife

## 2013-04-09 DIAGNOSIS — N390 Urinary tract infection, site not specified: Secondary | ICD-10-CM

## 2013-04-09 MED ORDER — NITROFURANTOIN MONOHYD MACRO 100 MG PO CAPS: 100.0000 mg | ORAL_CAPSULE | Freq: Two times a day (BID) | ORAL | Status: DC

## 2013-04-10 LAB — URINE CULTURE: Colony Count: >=100000

## 2013-04-11 NOTE — Progress Notes (Signed)
Encounter reviewed by Dr. Brook Silva.  

## 2013-04-12 ENCOUNTER — Telehealth: Payer: Self-pay | Admitting: *Deleted

## 2013-04-12 NOTE — Telephone Encounter (Signed)
Message copied by Graylon Good on Mon Apr 12, 2013  1:36 PM ------      Message from: Regina Eck      Created: Fri Apr 09, 2013 10:28 AM       Notify patient that urine culture is positive with E.Coli and Rx needed      Rx Macrobid sent to pharmacy, patient will need a two week TOC with urine culture      Pap smear reviewed negative 02 ------

## 2013-04-12 NOTE — Telephone Encounter (Signed)
Returning a call to Stephanie. °

## 2013-04-12 NOTE — Telephone Encounter (Signed)
Patient is returning a call to Stephanie. °

## 2013-04-12 NOTE — Telephone Encounter (Signed)
I have attempted to contact this patient by phone with the following results: left message to return my call on answering machine (home/mobile).  

## 2013-04-13 NOTE — Telephone Encounter (Signed)
Spoke with patient and message from Regina Eck CNM given. 2 week test of cure appointment made.

## 2013-04-21 ENCOUNTER — Ambulatory Visit: Payer: 59

## 2013-04-27 ENCOUNTER — Ambulatory Visit
Admission: RE | Admit: 2013-04-27 | Discharge: 2013-04-27 | Disposition: A | Discharge status: Home / Self Care | Payer: 59 | Source: Ambulatory Visit

## 2013-04-27 ENCOUNTER — Ambulatory Visit (INDEPENDENT_AMBULATORY_CARE_PROVIDER_SITE_OTHER): Payer: 59 | Admitting: *Deleted

## 2013-04-27 VITALS — BP 102/64 | HR 74 | Resp 16 | Wt 196.0 lb

## 2013-04-27 DIAGNOSIS — N390 Urinary tract infection, site not specified: Secondary | ICD-10-CM

## 2013-04-27 DIAGNOSIS — Z1231 Encounter for screening mammogram for malignant neoplasm of breast: Secondary | ICD-10-CM

## 2013-04-28 ENCOUNTER — Ambulatory Visit: Payer: Self-pay

## 2013-04-28 LAB — URINE CULTURE
Colony Count: NO GROWTH
Organism ID, Bacteria: NO GROWTH

## 2013-06-04 DIAGNOSIS — S82839A Other fracture of upper and lower end of unspecified fibula, initial encounter for closed fracture: Secondary | ICD-10-CM

## 2013-06-04 DIAGNOSIS — S82891A Other fracture of right lower leg, initial encounter for closed fracture: Secondary | ICD-10-CM

## 2013-06-04 HISTORY — DX: Other fracture of upper and lower end of unspecified fibula, initial encounter for closed fracture: S82.839A

## 2013-06-04 HISTORY — DX: Other fracture of right lower leg, initial encounter for closed fracture: S82.891A

## 2013-06-07 ENCOUNTER — Ambulatory Visit (HOSPITAL_BASED_OUTPATIENT_CLINIC_OR_DEPARTMENT_OTHER)
Admission: RE | Admit: 2013-06-07 | Discharge: 2013-06-07 | Disposition: A | Discharge status: Home / Self Care | Payer: 59 | Source: Ambulatory Visit | Attending: Orthopedic Surgery | Admitting: Orthopedic Surgery

## 2013-06-07 ENCOUNTER — Encounter: Payer: Self-pay | Admitting: Physician Assistant

## 2013-06-07 ENCOUNTER — Other Ambulatory Visit: Payer: Self-pay | Admitting: Physician Assistant

## 2013-06-07 ENCOUNTER — Encounter (HOSPITAL_BASED_OUTPATIENT_CLINIC_OR_DEPARTMENT_OTHER)
Admission: RE | Disposition: A | Discharge status: Home / Self Care | Payer: Self-pay | Source: Ambulatory Visit | Attending: Orthopedic Surgery

## 2013-06-07 ENCOUNTER — Encounter (HOSPITAL_BASED_OUTPATIENT_CLINIC_OR_DEPARTMENT_OTHER): Payer: 59 | Admitting: Anesthesiology

## 2013-06-07 ENCOUNTER — Encounter (HOSPITAL_BASED_OUTPATIENT_CLINIC_OR_DEPARTMENT_OTHER): Payer: Self-pay | Admitting: *Deleted

## 2013-06-07 ENCOUNTER — Ambulatory Visit (HOSPITAL_BASED_OUTPATIENT_CLINIC_OR_DEPARTMENT_OTHER): Payer: 59 | Admitting: Anesthesiology

## 2013-06-07 DIAGNOSIS — N200 Calculus of kidney: Secondary | ICD-10-CM | POA: Insufficient documentation

## 2013-06-07 DIAGNOSIS — F411 Generalized anxiety disorder: Secondary | ICD-10-CM | POA: Insufficient documentation

## 2013-06-07 DIAGNOSIS — E282 Polycystic ovarian syndrome: Secondary | ICD-10-CM

## 2013-06-07 DIAGNOSIS — X500XXA Overexertion from strenuous movement or load, initial encounter: Secondary | ICD-10-CM | POA: Insufficient documentation

## 2013-06-07 DIAGNOSIS — F3289 Other specified depressive episodes: Secondary | ICD-10-CM | POA: Insufficient documentation

## 2013-06-07 DIAGNOSIS — Z91018 Allergy to other foods: Secondary | ICD-10-CM | POA: Insufficient documentation

## 2013-06-07 DIAGNOSIS — S82899A Other fracture of unspecified lower leg, initial encounter for closed fracture: Secondary | ICD-10-CM | POA: Insufficient documentation

## 2013-06-07 DIAGNOSIS — I10 Essential (primary) hypertension: Secondary | ICD-10-CM | POA: Insufficient documentation

## 2013-06-07 DIAGNOSIS — E119 Type 2 diabetes mellitus without complications: Secondary | ICD-10-CM | POA: Insufficient documentation

## 2013-06-07 DIAGNOSIS — S93429A Sprain of deltoid ligament of unspecified ankle, initial encounter: Secondary | ICD-10-CM | POA: Insufficient documentation

## 2013-06-07 DIAGNOSIS — F329 Major depressive disorder, single episode, unspecified: Secondary | ICD-10-CM | POA: Insufficient documentation

## 2013-06-07 DIAGNOSIS — E7800 Pure hypercholesterolemia, unspecified: Secondary | ICD-10-CM | POA: Insufficient documentation

## 2013-06-07 DIAGNOSIS — S82839A Other fracture of upper and lower end of unspecified fibula, initial encounter for closed fracture: Secondary | ICD-10-CM

## 2013-06-07 HISTORY — PX: ORIF ANKLE FRACTURE: SHX5408

## 2013-06-07 LAB — GLUCOSE, CAPILLARY: Glucose-Capillary: 142 mg/dL — ABNORMAL HIGH (ref 70–99)

## 2013-06-07 SURGERY — OPEN REDUCTION INTERNAL FIXATION (ORIF) ANKLE FRACTURE
Anesthesia: General | Site: Ankle | Laterality: Right

## 2013-06-07 MED ORDER — MIDAZOLAM HCL 2 MG/2ML IJ SOLN
1.0000 mg | INTRAMUSCULAR | Status: DC | PRN
  Administered 2013-06-07: 2 mg via INTRAVENOUS

## 2013-06-07 MED ORDER — FENTANYL CITRATE 0.05 MG/ML IJ SOLN
INTRAMUSCULAR | Status: AC
  Filled 2013-06-07: qty 2

## 2013-06-07 MED ORDER — CHLORHEXIDINE GLUCONATE 4 % EX LIQD: 60.0000 mL | Freq: Once | CUTANEOUS | Status: DC

## 2013-06-07 MED ORDER — LIDOCAINE HCL (CARDIAC) 20 MG/ML IV SOLN
INTRAVENOUS | Status: DC | PRN
  Administered 2013-06-07: 40 mg via INTRAVENOUS

## 2013-06-07 MED ORDER — PROPOFOL 10 MG/ML IV BOLUS
INTRAVENOUS | Status: AC
  Filled 2013-06-07: qty 20

## 2013-06-07 MED ORDER — FENTANYL CITRATE 0.05 MG/ML IJ SOLN
50.0000 ug | INTRAMUSCULAR | Status: DC | PRN
  Administered 2013-06-07: 100 ug via INTRAVENOUS

## 2013-06-07 MED ORDER — LACTATED RINGERS IV SOLN: INTRAVENOUS | Status: DC

## 2013-06-07 MED ORDER — ONDANSETRON HCL 4 MG/2ML IJ SOLN
INTRAMUSCULAR | Status: DC | PRN
  Administered 2013-06-07: 4 mg via INTRAVENOUS

## 2013-06-07 MED ORDER — MIDAZOLAM HCL 2 MG/2ML IJ SOLN
INTRAMUSCULAR | Status: AC
  Filled 2013-06-07: qty 2

## 2013-06-07 MED ORDER — PROMETHAZINE HCL 25 MG/ML IJ SOLN: 6.2500 mg | INTRAMUSCULAR | Status: DC | PRN

## 2013-06-07 MED ORDER — SCOPOLAMINE 1 MG/3DAYS TD PT72
MEDICATED_PATCH | TRANSDERMAL | Status: AC
  Filled 2013-06-07: qty 1

## 2013-06-07 MED ORDER — LACTATED RINGERS IV SOLN
INTRAVENOUS | Status: DC
  Administered 2013-06-07 (×3): via INTRAVENOUS

## 2013-06-07 MED ORDER — CEFAZOLIN SODIUM-DEXTROSE 2-3 GM-% IV SOLR
INTRAVENOUS | Status: AC
  Filled 2013-06-07: qty 50

## 2013-06-07 MED ORDER — CEFAZOLIN SODIUM-DEXTROSE 2-3 GM-% IV SOLR: 2.0000 g | INTRAVENOUS | Status: DC

## 2013-06-07 MED ORDER — PHENYLEPHRINE HCL 10 MG/ML IJ SOLN
INTRAMUSCULAR | Status: DC | PRN
  Administered 2013-06-07: 100 ug via INTRAVENOUS

## 2013-06-07 MED ORDER — DEXAMETHASONE SODIUM PHOSPHATE 10 MG/ML IJ SOLN
INTRAMUSCULAR | Status: DC | PRN
  Administered 2013-06-07: 4 mg

## 2013-06-07 MED ORDER — CEFAZOLIN SODIUM-DEXTROSE 2-3 GM-% IV SOLR
INTRAVENOUS | Status: DC | PRN
  Administered 2013-06-07: 2 g via INTRAVENOUS

## 2013-06-07 MED ORDER — OXYCODONE HCL 5 MG PO TABS
5.0000 mg | ORAL_TABLET | Freq: Once | ORAL | Status: DC | PRN
Start: 2013-06-07 — End: 2013-06-07

## 2013-06-07 MED ORDER — MIDAZOLAM HCL 5 MG/5ML IJ SOLN
INTRAMUSCULAR | Status: DC | PRN
  Administered 2013-06-07: 2 mg via INTRAVENOUS

## 2013-06-07 MED ORDER — ETOMIDATE 2 MG/ML IV SOLN
INTRAVENOUS | Status: AC
  Filled 2013-06-07: qty 10

## 2013-06-07 MED ORDER — DEXAMETHASONE SODIUM PHOSPHATE 4 MG/ML IJ SOLN
INTRAMUSCULAR | Status: DC | PRN
  Administered 2013-06-07: 10 mg via INTRAVENOUS

## 2013-06-07 MED ORDER — FENTANYL CITRATE 0.05 MG/ML IJ SOLN
INTRAMUSCULAR | Status: AC
  Filled 2013-06-07: qty 4

## 2013-06-07 MED ORDER — PROPOFOL 10 MG/ML IV BOLUS
INTRAVENOUS | Status: DC | PRN
  Administered 2013-06-07: 200 mg via INTRAVENOUS

## 2013-06-07 MED ORDER — BUPIVACAINE-EPINEPHRINE PF 0.5-1:200000 % IJ SOLN
INTRAMUSCULAR | Status: DC | PRN
  Administered 2013-06-07: 45 mL

## 2013-06-07 MED ORDER — OXYCODONE HCL 5 MG/5ML PO SOLN: 5.0000 mg | Freq: Once | ORAL | Status: DC | PRN

## 2013-06-07 MED ORDER — FENTANYL CITRATE 0.05 MG/ML IJ SOLN
INTRAMUSCULAR | Status: DC | PRN
  Administered 2013-06-07: 100 ug via INTRAVENOUS
  Administered 2013-06-07: 25 ug via INTRAVENOUS

## 2013-06-07 MED ORDER — EPHEDRINE SULFATE 50 MG/ML IJ SOLN
INTRAMUSCULAR | Status: DC | PRN
  Administered 2013-06-07: 10 mg via INTRAVENOUS

## 2013-06-07 MED ORDER — HYDROMORPHONE HCL PF 1 MG/ML IJ SOLN: 0.2500 mg | INTRAMUSCULAR | Status: DC | PRN

## 2013-06-07 SURGICAL SUPPLY — 70 items
BANDAGE ELASTIC 4 VELCRO ST LF (GAUZE/BANDAGES/DRESSINGS) IMPLANT
BANDAGE ELASTIC 6 VELCRO ST LF (GAUZE/BANDAGES/DRESSINGS) ×2 IMPLANT
BANDAGE ESMARK 6X9 LF (GAUZE/BANDAGES/DRESSINGS) ×1 IMPLANT
BENZOIN TINCTURE PRP APPL 2/3 (GAUZE/BANDAGES/DRESSINGS) IMPLANT
BLADE SURG 15 STRL LF DISP TIS (BLADE) ×1 IMPLANT
BLADE SURG 15 STRL SS (BLADE) ×1
BNDG COHESIVE 4X5 TAN STRL (GAUZE/BANDAGES/DRESSINGS) ×2 IMPLANT
BNDG ESMARK 6X9 LF (GAUZE/BANDAGES/DRESSINGS) ×2
CANISTER SUCT 1200ML W/VALVE (MISCELLANEOUS) ×2 IMPLANT
COVER TABLE BACK 60X90 (DRAPES) ×2 IMPLANT
CUFF TOURNIQUET SINGLE 34IN LL (TOURNIQUET CUFF) ×2 IMPLANT
DECANTER SPIKE VIAL GLASS SM (MISCELLANEOUS) IMPLANT
DRAPE EXTREMITY T 121X128X90 (DRAPE) ×2 IMPLANT
DRAPE OEC MINIVIEW 54X84 (DRAPES) ×2 IMPLANT
DRAPE U 20/CS (DRAPES) ×2 IMPLANT
DRAPE U-SHAPE 47X51 STRL (DRAPES) ×2 IMPLANT
DURAPREP 26ML APPLICATOR (WOUND CARE) ×2 IMPLANT
ELECT REM PT RETURN 9FT ADLT (ELECTROSURGICAL) ×2
ELECTRODE REM PT RTRN 9FT ADLT (ELECTROSURGICAL) ×1 IMPLANT
GAUZE XEROFORM 1X8 LF (GAUZE/BANDAGES/DRESSINGS) ×2 IMPLANT
GLOVE BIO SURGEON STRL SZ7 (GLOVE) ×2 IMPLANT
GLOVE BIOGEL PI IND STRL 7.0 (GLOVE) ×1 IMPLANT
GLOVE BIOGEL PI IND STRL 7.5 (GLOVE) ×1 IMPLANT
GLOVE BIOGEL PI INDICATOR 7.0 (GLOVE) ×1
GLOVE BIOGEL PI INDICATOR 7.5 (GLOVE) ×1
GLOVE ECLIPSE 6.5 STRL STRAW (GLOVE) ×2 IMPLANT
GLOVE ORTHO TXT STRL SZ7.5 (GLOVE) ×2 IMPLANT
GOWN STRL REUS W/ TWL LRG LVL3 (GOWN DISPOSABLE) ×2 IMPLANT
GOWN STRL REUS W/ TWL XL LVL3 (GOWN DISPOSABLE) ×1 IMPLANT
GOWN STRL REUS W/TWL LRG LVL3 (GOWN DISPOSABLE) ×2
GOWN STRL REUS W/TWL XL LVL3 (GOWN DISPOSABLE) ×3 IMPLANT
NEEDLE HYPO 25X1 1.5 SAFETY (NEEDLE) IMPLANT
NS IRRIG 1000ML POUR BTL (IV SOLUTION) ×2 IMPLANT
PACK BASIN DAY SURGERY FS (CUSTOM PROCEDURE TRAY) ×2 IMPLANT
PAD ABD 8X10 STRL (GAUZE/BANDAGES/DRESSINGS) ×2 IMPLANT
PAD CAST 4YDX4 CTTN HI CHSV (CAST SUPPLIES) ×2 IMPLANT
PADDING CAST COTTON 4X4 STRL (CAST SUPPLIES) ×2
PADDING CAST COTTON 6X4 STRL (CAST SUPPLIES) IMPLANT
PASSER SUT SWANSON 36MM LOOP (INSTRUMENTS) ×2 IMPLANT
PENCIL BUTTON HOLSTER BLD 10FT (ELECTRODE) ×2 IMPLANT
PLATE LOCKING STR 6H (Plate) ×2 IMPLANT
REPAIR TROPE KNTLS SS SYNDESMO (Orthopedic Implant) ×6 IMPLANT
RETRIEVER SUT HEWSON (MISCELLANEOUS) ×2 IMPLANT
SCREW LOW PROFILE 3.5X14 (Screw) ×4 IMPLANT
SCREW NLOCK T15 FT 18X3.5XST (Screw) ×2 IMPLANT
SCREW NON LOCK 3.5X18MM (Screw) ×2 IMPLANT
SCREW NON LOCKING 4.0X20 (Screw) ×4 IMPLANT
SLEEVE SCD COMPRESS KNEE MED (MISCELLANEOUS) ×2 IMPLANT
SPLINT FAST PLASTER 5X30 (CAST SUPPLIES) ×20
SPLINT FIBERGLASS 4X30 (CAST SUPPLIES) IMPLANT
SPLINT PLASTER CAST FAST 5X30 (CAST SUPPLIES) ×20 IMPLANT
SPONGE GAUZE 4X4 12PLY (GAUZE/BANDAGES/DRESSINGS) ×2 IMPLANT
SPONGE LAP 4X18 X RAY DECT (DISPOSABLE) ×4 IMPLANT
STAPLER VISISTAT 35W (STAPLE) IMPLANT
STOCKINETTE 6  STRL (DRAPES) ×1
STOCKINETTE 6 STRL (DRAPES) ×1 IMPLANT
STRIP CLOSURE SKIN 1/2X4 (GAUZE/BANDAGES/DRESSINGS) IMPLANT
SUCTION FRAZIER TIP 10 FR DISP (SUCTIONS) ×2 IMPLANT
SUT ETHILON 3 0 PS 1 (SUTURE) ×2 IMPLANT
SUT MNCRL AB 4-0 PS2 18 (SUTURE) ×2 IMPLANT
SUT VIC AB 0 CT1 27 (SUTURE) ×1
SUT VIC AB 0 CT1 27XBRD ANBCTR (SUTURE) ×1 IMPLANT
SUT VIC AB 2-0 SH 27 (SUTURE) ×2
SUT VIC AB 2-0 SH 27XBRD (SUTURE) ×2 IMPLANT
SUT VICRYL 4-0 PS2 18IN ABS (SUTURE) IMPLANT
SYR BULB 3OZ (MISCELLANEOUS) ×2 IMPLANT
SYR CONTROL 10ML LL (SYRINGE) IMPLANT
TUBE CONNECTING 20X1/4 (TUBING) ×2 IMPLANT
UNDERPAD 30X30 INCONTINENT (UNDERPADS AND DIAPERS) ×2 IMPLANT
YANKAUER SUCT BULB TIP NO VENT (SUCTIONS) IMPLANT

## 2013-06-07 NOTE — Interval H&P Note (Signed)
History and Physical Interval Note:  06/07/2013 12:18 PM  Nicole Khan  has presented today for surgery, with the diagnosis of RIGHT ANKLE FX  The various methods of treatment have been discussed with the patient and family. After consideration of risks, benefits and other options for treatment, the patient has consented to  Procedure(s): OPEN REDUCTION INTERNAL FIXATION (ORIF) ANKLE FRACTURE  FIBULA SYNDESMOSIS (Right) as a surgical intervention .  The patient's history has been reviewed, patient examined, no change in status, stable for surgery.  I have reviewed the patient's chart and labs.  Questions were answered to the patient's satisfaction.     Jannis Atkins F

## 2013-06-07 NOTE — Transfer of Care (Signed)
Immediate Anesthesia Transfer of Care Note  Patient: Nicole Khan  Procedure(s) Performed: Procedure(s): OPEN REDUCTION INTERNAL FIXATION (ORIF) ANKLE FRACTURE  FIBULA SYNDESMOSIS (Right)  Patient Location: PACU  Anesthesia Type:GA combined with regional for post-op pain and popliteal block2  Level of Consciousness: sedated and patient cooperative  Airway & Oxygen Therapy: Patient Spontanous Breathing and Patient connected to face mask oxygen  Post-op Assessment: Report given to PACU RN and Post -op Vital signs reviewed and stable  Post vital signs: Reviewed and stable  Complications: No apparent anesthesia complications

## 2013-06-07 NOTE — Discharge Instructions (Signed)
Ankle Fracture with Open Reduction and Internal Fixation (ORIF) AFTER THE PROCEDURE  Non-weight bearing at all times.  Do not remove splint until seen back in our office.  May shower, but do not let splint get wet.  May use ice for up to 20 minutes at a time for pain and swelling.  Follow up appointment in our office in one week.    Only take over-the-counter or prescription medicines for pain, discomfort, or fever as directed by your caregiver.  You may use ice for 15-20 minutes, 03-04 times per day, for the first 2 to 3 days.  Change dressings and see your caregiver as directed.  Your caregiver will instruct you when to begin using and exercising your arm and elbow. SEEK IMMEDIATE MEDICAL CARE IF:  There is redness, swelling, or increasing pain in the surgical area.  Pus, blood or unusual drainage is coming from the area or is visible on the dressings or cast.  An unexplained oral temperature above 102 F (38.9 C) develops.  You notice a foul smell coming from the surgical site or dressing.  The wound breaks open (edges are not staying together) after stitches have been removed.  You develop increasing pain or increasing pain with motion of your fingers.  There is numbness or tingling in your hand or forearm.  You have any other questions or concerns following surgery. Document Released: 09/11/2000 Document Revised: 06/10/2011 Document Reviewed: 04/04/2008 [facility] Patient Information 2014 [place], [place].   Post Anesthesia Home Care Instructions  Activity: Get plenty of rest for the remainder of the day. A responsible adult should stay with you for 24 hours following the procedure.  For the next 24 hours, DO NOT: -Drive a car -Paediatric nurse -Drink alcoholic beverages -Take any medication unless instructed by your physician -Make any legal decisions or sign important papers.  Meals: Start with liquid foods such as gelatin or soup. Progress to regular foods as  tolerated. Avoid greasy, spicy, heavy foods. If nausea and/or vomiting occur, drink only clear liquids until the nausea and/or vomiting subsides. Call your physician if vomiting continues.  Special Instructions/Symptoms: Your throat may feel dry or sore from the anesthesia or the breathing tube placed in your throat during surgery. If this causes discomfort, gargle with warm salt water. The discomfort should disappear within 24 hours.   Regional Anesthesia Blocks  1. Numbness or the inability to move the "blocked" extremity may last from 3-48 hours after placement. The length of time depends on the medication injected and your individual response to the medication. If the numbness is not going away after 48 hours, call your surgeon.  2. The extremity that is blocked will need to be protected until the numbness is gone and the  Strength has returned. Because you cannot feel it, you will need to take extra care to avoid injury. Because it may be weak, you may have difficulty moving it or using it. You may not know what position it is in without looking at it while the block is in effect.  3. For blocks in the legs and feet, returning to weight bearing and walking needs to be done carefully. You will need to wait until the numbness is entirely gone and the strength has returned. You should be able to move your leg and foot normally before you try and bear weight or walk. You will need someone to be with you when you first try to ensure you do not fall and possibly risk injury.  4. Bruising and tenderness at the needle site are common side effects and will resolve in a few days.  5. Persistent numbness or new problems with movement should be communicated to the surgeon or the [place] [phone] [place] [phone]).

## 2013-06-07 NOTE — Anesthesia Preprocedure Evaluation (Signed)
Anesthesia Evaluation  Patient identified by MRN, date of birth, ID band Patient awake    Reviewed: Allergy & Precautions, H&P , NPO status , Patient's Chart, lab work & pertinent test results  Airway Mallampati: II TM Distance: >3 FB Neck ROM: Full    Dental   Pulmonary  breath sounds clear to auscultation        Cardiovascular hypertension, Rhythm:Regular Rate:Normal     Neuro/Psych Anxiety Depression    GI/Hepatic   Endo/Other  diabetes  Renal/GU      Musculoskeletal   Abdominal (+) + obese,   Peds  Hematology   Anesthesia Other Findings   Reproductive/Obstetrics                           Anesthesia Physical Anesthesia Plan  ASA: III  Anesthesia Plan: General   Post-op Pain Management:    Induction: Intravenous  Airway Management Planned: LMA  Additional Equipment:   Intra-op Plan:   Post-operative Plan: Extubation in OR  Informed Consent: I have reviewed the patients History and Physical, chart, labs and discussed the procedure including the risks, benefits and alternatives for the proposed anesthesia with the patient or authorized representative who has indicated his/her understanding and acceptance.     Plan Discussed with: CRNA and Surgeon  Anesthesia Plan Comments:         Anesthesia Quick Evaluation

## 2013-06-07 NOTE — H&P (View-Only) (Signed)
Nicole Khan is an 48 y.o. female.   Chief Complaint: right ankle pain HPI: 17 yowf fell March 6, injuring her right ankle.  The next day she twisted it again.  Since March 7th she has been unable to bear weight.  March 8th she went to [facility].  Xrays there showed a fracture dislocation of her right ankle.  From there she went to [facility].  He underwent a joint hematoma block and a closed reduction of her ankle.  Acceptable position was achieved.  She is being taken to the OR to be place in anatomic postion and have her fibula and her syndesmosis fixed.    Past Medical History  Diagnosis Date  . Diabetes mellitus without complication   . Hypertension   . Polycystic ovary disease   . Hypercholesteremia   . Anxiety   . Depression   . Fracture dislocation of right ankle joint 06-04-2013  . Fracture of distal fibula 06-04-2013    Past Surgical History  Procedure Laterality Date  . Carpal tunnel release    . Cesarean section    . Cervical cerclage  07/1991; 05/1999    x 2  . Cholecystectomy, laparoscopic  1996    Family History  Problem Relation Age of Onset  . Hypertension Mother   . Infertility Mother   . Deep vein thrombosis Mother   . Anxiety disorder Mother   . Depression Mother   . Hepatitis C Mother     blood transfusion  . Aneurysm Mother     [place]  . Cancer Father     gallbladder, renal  . Hypertension Father   . Anxiety disorder Father   . Depression Father   . Multiple births Sister   . Cancer Sister     thyroid cancer  . Thyroid disease Sister   . Anxiety disorder Sister   . Depression Sister   . Hypertension Brother   . Anxiety disorder Brother   . Depression Brother    Social History:  reports that she has never smoked. She has never used smokeless tobacco. She reports that she does not drink alcohol or use illicit drugs.  Allergies:  Allergies  Allergen Reactions  . Hazel Tree Pollen [Corylus]    Hazelnuts, makes patients mouth swell  . Soy Allergy     Soy milk   Current Outpatient Prescriptions on File Prior to Visit  Medication Sig Dispense Refill  . buPROPion (WELLBUTRIN XL) 300 MG 24 hr tablet Take 300 mg by mouth daily.      . citalopram (CELEXA) 20 MG tablet Take 20 mg by mouth daily.      Marland Kitchen loratadine (CLARITIN) 10 MG tablet Take 10 mg by mouth daily.      Marland Kitchen lovastatin (MEVACOR) 40 MG tablet Take 40 mg by mouth at bedtime.      . metFORMIN (GLUCOPHAGE-XR) 500 MG 24 hr tablet Take 1,000 mg by mouth 2 (two) times daily.       . metoprolol (LOPRESSOR) 100 MG tablet Take 100 mg by mouth 2 (two) times daily.      . nitrofurantoin, macrocrystal-monohydrate, (MACROBID) 100 MG capsule Take 1 capsule (100 mg total) by mouth 2 (two) times daily.  14 capsule  0  . norethindrone (MICRONOR,CAMILA,ERRIN) 0.35 MG tablet Take 1 tablet (0.35 mg total) by mouth daily.  3 Package  3  . oxyCODONE-acetaminophen (ROXICET) 5-325 MG per tablet Take 1 tablet by mouth every 4 (four) hours as needed  for severe pain.  20 tablet  0  . spironolactone (ALDACTONE) 100 MG tablet Take 100 mg by mouth 2 (two) times daily.       No current facility-administered medications on file prior to visit.     (Not in a hospital admission)  No results found for this or any previous visit (from the past 48 hour(s)). No results found.  Review of Systems  Constitutional: Negative.   HENT: Negative.   Eyes: Negative.   Respiratory: Negative.   Cardiovascular: Negative.   Gastrointestinal: Negative.   Genitourinary: Negative.   Musculoskeletal:       Right ankle pain  Skin: Negative.   Endo/Heme/Allergies: Negative.     Height 5\' 2"  (1.575 m), weight 88.451 kg (195 lb). Physical Exam  Constitutional: She is oriented to person, place, and time. She appears well-developed and well-nourished.  HENT:  Head: Normocephalic and atraumatic.  Eyes: Conjunctivae and EOM are normal. Pupils are equal, round, and  reactive to light.  Cardiovascular: Normal rate and regular rhythm.   Respiratory: Effort normal and breath sounds normal.  GI: Soft. Bowel sounds are normal.  Genitourinary:  Not pertinent to current symptomatology therefore not examined.  Musculoskeletal:  Right ankle swollen with a lot of echymosis and edema.  2+DP pulses  Neurological: She is alert and oriented to person, place, and time.  Skin: Skin is warm and dry.  Psychiatric: She has a normal mood and affect. Her behavior is normal.     Assessment Patient Active Problem List   Diagnosis Date Noted  . Polycystic ovary disease   . Fracture of distal fibula 06/04/2013  . HTN (hypertension) 04/07/2013  . PCOS (polycystic ovarian syndrome) 04/07/2013  . Diabetes 04/07/2013  . Renal calculi 04/07/2013  . Adjustment disorder with mixed anxiety and depressed mood 04/07/2013    Plan To OR for open reduction internal fixation of right ankle fracture dislocation by Dr Percell Miller.   The risks, benefits, and possible complications of the procedure were discussed in detail with the patient.  The patient is without question.  [name] 06/07/2013, 9:43 AM

## 2013-06-07 NOTE — H&P (Signed)
Nicole Khan is an 48 y.o. female.   Chief Complaint: right ankle pain HPI: 48 yowf fell March 6, injuring her right ankle.  The next day she twisted it again.  Since March 7th she has been unable to bear weight.  March 8th she went to [facility].  Xrays there showed a fracture dislocation of her right ankle.  From there she went to [facility].  He underwent a joint hematoma block and a closed reduction of her ankle.  Acceptable position was achieved.  She is being taken to the OR to be place in anatomic postion and have her fibula and her syndesmosis fixed.    Past Medical History  Diagnosis Date  . Diabetes mellitus without complication   . Hypertension   . Polycystic ovary disease   . Hypercholesteremia   . Anxiety   . Depression   . Fracture dislocation of right ankle joint 06-04-2013  . Fracture of distal fibula 06-04-2013    Past Surgical History  Procedure Laterality Date  . Carpal tunnel release    . Cesarean section    . Cervical cerclage  07/1991; 05/1999    x 2  . Cholecystectomy, laparoscopic  1996    Family History  Problem Relation Age of Onset  . Hypertension Mother   . Infertility Mother   . Deep vein thrombosis Mother   . Anxiety disorder Mother   . Depression Mother   . Hepatitis C Mother     blood transfusion  . Aneurysm Mother     [place]  . Cancer Father     gallbladder, renal  . Hypertension Father   . Anxiety disorder Father   . Depression Father   . Multiple births Sister   . Cancer Sister     thyroid cancer  . Thyroid disease Sister   . Anxiety disorder Sister   . Depression Sister   . Hypertension Brother   . Anxiety disorder Brother   . Depression Brother    Social History:  reports that she has never smoked. She has never used smokeless tobacco. She reports that she does not drink alcohol or use illicit drugs.  Allergies:  Allergies  Allergen Reactions  . Hazel Tree Pollen [Corylus]    Hazelnuts, makes patients mouth swell  . Soy Allergy     Soy milk   Current Outpatient Prescriptions on File Prior to Visit  Medication Sig Dispense Refill  . buPROPion (WELLBUTRIN XL) 300 MG 24 hr tablet Take 300 mg by mouth daily.      . citalopram (CELEXA) 20 MG tablet Take 20 mg by mouth daily.      . loratadine (CLARITIN) 10 MG tablet Take 10 mg by mouth daily.      . lovastatin (MEVACOR) 40 MG tablet Take 40 mg by mouth at bedtime.      . metFORMIN (GLUCOPHAGE-XR) 500 MG 24 hr tablet Take 1,000 mg by mouth 2 (two) times daily.       . metoprolol (LOPRESSOR) 100 MG tablet Take 100 mg by mouth 2 (two) times daily.      . nitrofurantoin, macrocrystal-monohydrate, (MACROBID) 100 MG capsule Take 1 capsule (100 mg total) by mouth 2 (two) times daily.  14 capsule  0  . norethindrone (MICRONOR,CAMILA,ERRIN) 0.35 MG tablet Take 1 tablet (0.35 mg total) by mouth daily.  3 Package  3  . oxyCODONE-acetaminophen (ROXICET) 5-325 MG per tablet Take 1 tablet by mouth every 4 (four) hours as needed   for severe pain.  20 tablet  0  . spironolactone (ALDACTONE) 100 MG tablet Take 100 mg by mouth 2 (two) times daily.       No current facility-administered medications on file prior to visit.     (Not in a hospital admission)  No results found for this or any previous visit (from the past 48 hour(s)). No results found.  Review of Systems  Constitutional: Negative.   HENT: Negative.   Eyes: Negative.   Respiratory: Negative.   Cardiovascular: Negative.   Gastrointestinal: Negative.   Genitourinary: Negative.   Musculoskeletal:       Right ankle pain  Skin: Negative.   Endo/Heme/Allergies: Negative.     Height 5\' 2"  (1.575 m), weight 88.451 kg (195 lb). Physical Exam  Constitutional: She is oriented to person, place, and time. She appears well-developed and well-nourished.  HENT:  Head: Normocephalic and atraumatic.  Eyes: Conjunctivae and EOM are normal. Pupils are equal, round, and  reactive to light.  Cardiovascular: Normal rate and regular rhythm.   Respiratory: Effort normal and breath sounds normal.  GI: Soft. Bowel sounds are normal.  Genitourinary:  Not pertinent to current symptomatology therefore not examined.  Musculoskeletal:  Right ankle swollen with a lot of echymosis and edema.  2+DP pulses  Neurological: She is alert and oriented to person, place, and time.  Skin: Skin is warm and dry.  Psychiatric: She has a normal mood and affect. Her behavior is normal.     Assessment Patient Active Problem List   Diagnosis Date Noted  . Polycystic ovary disease   . Fracture of distal fibula 06/04/2013  . HTN (hypertension) 04/07/2013  . PCOS (polycystic ovarian syndrome) 04/07/2013  . Diabetes 04/07/2013  . Renal calculi 04/07/2013  . Adjustment disorder with mixed anxiety and depressed mood 04/07/2013    Plan To OR for open reduction internal fixation of right ankle fracture dislocation by Dr Percell Miller.   The risks, benefits, and possible complications of the procedure were discussed in detail with the patient.  The patient is without question.  Nicole Khan J 06/07/2013, 9:43 AM

## 2013-06-07 NOTE — Progress Notes (Signed)
Assisted Dr. Kasik with right, popliteal/saphenous block. Side rails up, monitors on throughout procedure. See vital signs in flow sheet. Tolerated Procedure well. 

## 2013-06-07 NOTE — Anesthesia Postprocedure Evaluation (Signed)
  Anesthesia Post-op Note  Patient: Nicole Khan  Procedure(s) Performed: Procedure(s): OPEN REDUCTION INTERNAL FIXATION (ORIF) ANKLE FRACTURE  FIBULA SYNDESMOSIS (Right)  Patient Location: PACU  Anesthesia Type:GA combined with regional for post-op pain  Level of Consciousness: awake, alert  and oriented  Airway and Oxygen Therapy: Patient Spontanous Breathing  Post-op Pain: none  Post-op Assessment: Post-op Vital signs reviewed  Post-op Vital Signs: Reviewed  Complications: No apparent anesthesia complications

## 2013-06-07 NOTE — Anesthesia Procedure Notes (Addendum)
Anesthesia Regional Block:  Popliteal block  Pre-Anesthetic Checklist: ,, timeout performed, Correct Patient, Correct Site, Correct Laterality, Correct Procedure, Correct Position, site marked, Risks and benefits discussed,  Surgical consent,  Pre-op evaluation,  At surgeon's request and post-op pain management  Laterality: Right  Prep: chloraprep       Needles:  Injection technique: Single-shot  Needle Type: Echogenic Stimulator Needle     Needle Length: 9cm 9 cm Needle Gauge: 22 and 22 G    Additional Needles:  Procedures: nerve stimulator Popliteal block  Nerve Stimulator or Paresthesia:  Response: 0.5 mA,   Additional Responses:   Narrative:  Start time: 06/07/2013 10:52 AM End time: 06/07/2013 11:02 AM Injection made incrementally with aspirations every 5 mL. Anesthesiologist: Dr Chriss Driver  Additional Notes: 5093-2671 R Popliteal Nerve Block POP CHG prep sterile tech #22 stim/echo needle with stim only down to .50ma Multiple neg asp Altamese Dilling .5% w/epi 45cc+decadron 4mg  infiltrated No compl Dr Chriss Driver     Procedure Name: LMA Insertion Date/Time: 06/07/2013 12:31 PM Performed by: Toula Moos Pre-anesthesia Checklist: Patient identified, Emergency Drugs available, Suction available, Patient being monitored and Timeout performed Patient Re-evaluated:Patient Re-evaluated prior to inductionOxygen Delivery Method: Circle System Utilized Preoxygenation: Pre-oxygenation with 100% oxygen Intubation Type: IV induction Ventilation: Mask ventilation without difficulty LMA: LMA inserted LMA Size: 4.0 Number of attempts: 1 Airway Equipment and Method: bite block Placement Confirmation: positive ETCO2 and breath sounds checked- equal and bilateral Tube secured with: Tape Dental Injury: Teeth and Oropharynx as per pre-operative assessment

## 2013-06-08 NOTE — Addendum Note (Signed)
Addendum created 06/08/13 1345 by Tawni Millers, CRNA   Modules edited: Charges VN

## 2013-06-09 ENCOUNTER — Encounter (HOSPITAL_BASED_OUTPATIENT_CLINIC_OR_DEPARTMENT_OTHER): Payer: Self-pay | Admitting: Orthopedic Surgery

## 2013-06-09 NOTE — Op Note (Signed)
NAME:  GINNETTE, GATES NO.:  [id number]  MEDICAL RECORD NO.:  [id number]  LOCATION:                               FACILITY:  [facility]  PHYSICIAN:  Ninetta Lights, M.D. DATE OF BIRTH:  1965-05-18  DATE OF PROCEDURE:  06/07/2013 DATE OF DISCHARGE:  06/07/2013                              OPERATIVE REPORT   PREOPERATIVE DIAGNOSES:  Right ankle external rotation injury with torn deltoid ligament; displaced spiral fracture, distal fibula; and disruption, distal tib-fib syndesmosis.  POSTOPERATIVE DIAGNOSES:  Right ankle external rotation injury with torn deltoid ligament; displaced spiral fracture, distal fibula; and disruption, distal tib-fib syndesmosis with moderate osteopenia.  PROCEDURE:  Right ankle closed reduction of lateral subluxation.  Open reduction and internal fixation of the fibula with an Arthrex 6 hole plate and screws.  Repair of syndesmotic injury with a TightRope device.  SURGEON:  Ninetta Lights, M.D.  ASSISTANT:  Eula Listen, PA, present throughout the entire case and necessary for timely completion of procedure.  ANESTHESIA:  General.  BLOOD LOSS:  Minimal.  SPECIMENS:  None.  CULTURES:  None.  COMPLICATIONS:  None.  DRESSINGS:  Soft compressive with short-leg splint.  TOURNIQUET TIME:  1 hour and 10 minutes.  DESCRIPTION OF PROCEDURE:  The patient was brought to the operating room, placed on the operating table in supine position.  After adequate anesthesia had been obtained, tourniquet applied.  Prepped and draped in usual sterile fashion.  Exsanguinated with elevation of Esmarch. Tourniquet inflated to 350 mmHg.  An external rotation stress view with fluoroscopy.  The deltoid anterior capsule completely torn.  The syndesmosis widened, the fibula displaces.  I could reduce the ankle acceptably with internal rotation on the medial side.  I approached laterally.  A longitudinal incision over the fibula.  Skin and subcutaneous  tissue divided.  Fibula exposed, reduced anatomically, and fixed with a 6-hole plate posterolaterally.  Four proximal cortical screws and 2 distal cancellous screws.  Once that was completed, I went just in front of the plate at the level syndesmosis, put in a TightRope device.  This was pre-drilled the device head, but as I tightened it, the bone of the fibula was so soft that it pulled the washer from the fibula down into the distal tibia.  That device was abandoned by length of small washer in the tibia, was doing no harm and getting this out would have met, removing excess amount of bone.  I then removed one of the screws from the plate and reapplied a TightRope through the plate on the fibular side.  With this construct, I was able to get a good fixation on both sides.  At completion, I had the anatomic alignment of the syndesmosis of the ankle and a fracture very acceptably.  Wound was irrigated.  Two small puncture wounds on the medial side, plate with TightRope was closed with nylon.  The wound closed subcutaneous subcuticular.  Sterile compressive dressing was applied.  Short-leg splint was applied.  Tourniquet deflated, removed.  Anesthesia reversed. Brought to the recovery room.  Tolerated the surgery well.  No complications.     Ninetta Lights, M.D.     DFM/MEDQ  D:  06/08/2013  T:  06/09/2013  Job:  [id number]

## 2013-08-25 ENCOUNTER — Ambulatory Visit: Payer: 59 | Attending: Orthopedic Surgery | Admitting: Physical Therapy

## 2013-08-25 DIAGNOSIS — M25673 Stiffness of unspecified ankle, not elsewhere classified: Secondary | ICD-10-CM | POA: Insufficient documentation

## 2013-08-25 DIAGNOSIS — M25676 Stiffness of unspecified foot, not elsewhere classified: Secondary | ICD-10-CM | POA: Insufficient documentation

## 2013-08-25 DIAGNOSIS — M25579 Pain in unspecified ankle and joints of unspecified foot: Secondary | ICD-10-CM | POA: Insufficient documentation

## 2013-08-25 DIAGNOSIS — IMO0001 Reserved for inherently not codable concepts without codable children: Secondary | ICD-10-CM | POA: Insufficient documentation

## 2013-08-25 DIAGNOSIS — R269 Unspecified abnormalities of gait and mobility: Secondary | ICD-10-CM | POA: Insufficient documentation

## 2013-08-26 ENCOUNTER — Ambulatory Visit: Payer: 59 | Admitting: Physical Therapy

## 2013-08-31 ENCOUNTER — Ambulatory Visit: Payer: 59 | Attending: Orthopedic Surgery | Admitting: Rehabilitation

## 2013-08-31 DIAGNOSIS — R269 Unspecified abnormalities of gait and mobility: Secondary | ICD-10-CM | POA: Diagnosis not present

## 2013-08-31 DIAGNOSIS — IMO0001 Reserved for inherently not codable concepts without codable children: Secondary | ICD-10-CM | POA: Insufficient documentation

## 2013-08-31 DIAGNOSIS — M25676 Stiffness of unspecified foot, not elsewhere classified: Secondary | ICD-10-CM | POA: Insufficient documentation

## 2013-08-31 DIAGNOSIS — M25579 Pain in unspecified ankle and joints of unspecified foot: Secondary | ICD-10-CM | POA: Diagnosis not present

## 2013-08-31 DIAGNOSIS — M25673 Stiffness of unspecified ankle, not elsewhere classified: Secondary | ICD-10-CM | POA: Diagnosis not present

## 2013-09-07 ENCOUNTER — Encounter: Payer: 59 | Admitting: Rehabilitation

## 2013-09-09 ENCOUNTER — Ambulatory Visit: Payer: 59 | Admitting: Rehabilitation

## 2013-09-09 DIAGNOSIS — IMO0001 Reserved for inherently not codable concepts without codable children: Secondary | ICD-10-CM | POA: Diagnosis not present

## 2013-09-14 ENCOUNTER — Encounter: Payer: 59 | Admitting: Rehabilitation

## 2013-09-16 ENCOUNTER — Encounter: Payer: 59 | Admitting: Rehabilitation

## 2013-09-21 ENCOUNTER — Ambulatory Visit: Payer: 59 | Admitting: Physical Therapy

## 2013-09-21 DIAGNOSIS — IMO0001 Reserved for inherently not codable concepts without codable children: Secondary | ICD-10-CM | POA: Diagnosis not present

## 2013-09-23 ENCOUNTER — Encounter: Payer: 59 | Admitting: Physical Therapy

## 2013-09-27 ENCOUNTER — Encounter: Payer: 59 | Admitting: Rehabilitation

## 2013-09-29 ENCOUNTER — Encounter: Payer: 59 | Admitting: Rehabilitation

## 2013-10-04 ENCOUNTER — Encounter: Payer: 59 | Admitting: Rehabilitation

## 2013-10-06 ENCOUNTER — Encounter: Payer: 59 | Admitting: Rehabilitation

## 2014-01-31 ENCOUNTER — Encounter (HOSPITAL_BASED_OUTPATIENT_CLINIC_OR_DEPARTMENT_OTHER): Payer: Self-pay | Admitting: Orthopedic Surgery

## 2014-04-08 ENCOUNTER — Ambulatory Visit: Payer: 59 | Admitting: Nurse Practitioner

## 2014-04-14 ENCOUNTER — Encounter: Payer: Self-pay | Admitting: Nurse Practitioner

## 2014-04-14 ENCOUNTER — Ambulatory Visit (INDEPENDENT_AMBULATORY_CARE_PROVIDER_SITE_OTHER): Payer: 59 | Admitting: Nurse Practitioner

## 2014-04-14 VITALS — BP 116/62 | HR 80 | Ht 62.0 in | Wt 197.0 lb

## 2014-04-14 DIAGNOSIS — Z01419 Encounter for gynecological examination (general) (routine) without abnormal findings: Secondary | ICD-10-CM

## 2014-04-14 DIAGNOSIS — Z0000 Encounter for general adult medical examination without abnormal findings: Secondary | ICD-10-CM

## 2014-04-14 DIAGNOSIS — N939 Abnormal uterine and vaginal bleeding, unspecified: Secondary | ICD-10-CM

## 2014-04-14 DIAGNOSIS — R319 Hematuria, unspecified: Secondary | ICD-10-CM

## 2014-04-14 LAB — POCT URINALYSIS DIPSTICK
Bilirubin, UA: NEGATIVE
Glucose, UA: NEGATIVE
Ketones, UA: NEGATIVE
Leukocytes, UA: NEGATIVE
Nitrite, UA: NEGATIVE
Protein, UA: NEGATIVE
Urobilinogen, UA: NEGATIVE
pH, UA: 5

## 2014-04-14 MED ORDER — NORETHINDRONE 0.35 MG PO TABS: 1.0000 | ORAL_TABLET | Freq: Every day | ORAL | Status: DC

## 2014-04-14 NOTE — Patient Instructions (Signed)

## 2014-04-14 NOTE — Progress Notes (Signed)
Patient ID: Nicole Khan, female   DOB: 09/10/1965, 49 y.o.   MRN: [id number] 49 y.o. [id number] Married  Caucasian Fe here for annual exam.  No menses since 09/29/2012 on POP.  Then sometimes after SA will spot minimal amount for the past year. Now had a 2 days cycle 04/04/14 that was light to spotting.  Had mild cramps a few days before.  Last HGB AIC 5.7 11/2013.  Last PUS and endo biopsy was done 04/18/10 with no intracavitary lesions along with bilateral OV cyst.  The endo biopsy showed proliferative endometrium suggestive of disordered and multiple areas of ciliary metaplasia.    Patient's last menstrual period was 04/04/2014.          Sexually active: yes  The current method of family planning is husband with vasectomy,  POP (progesterone only) for treatment of PCO'S.  Exercising: no The patient does not participate in regular exercise at present. Smoker: no  Health Maintenance: Pap: 04/07/13, Negative; 04/02/12, ASCUS, neg HR HPV MMG: 04/27/13, 3D, Bi-Rads 1:  Negative  TDaP: ?? Labs:  HB:  Endocrinology  Urine:  Trace RBC   reports that she has never smoked. She has never used smokeless tobacco. She reports that she does not drink alcohol or use illicit drugs.  Past Medical History  Diagnosis Date  . Diabetes mellitus without complication   . Hypertension   . Polycystic ovary disease   . Hypercholesteremia   . Anxiety   . Depression   . Fracture dislocation of right ankle joint 06-04-2013  . Fracture of distal fibula 06-04-2013    Past Surgical History  Procedure Laterality Date  . Carpal tunnel release    . Cesarean section    . Cervical cerclage  07/1991; 05/1999    x 2  . Cholecystectomy, laparoscopic  1996  . Orif ankle fracture Right 06/07/2013    Procedure: OPEN REDUCTION INTERNAL FIXATION (ORIF) ANKLE FRACTURE  FIBULA SYNDESMOSIS;  Surgeon: Ninetta Lights, MD;  Location: [facility];  Service: Orthopedics;  Laterality: Right;    Current Outpatient  Prescriptions  Medication Sig Dispense Refill  . atorvastatin (LIPITOR) 40 MG tablet Take 1 tablet by mouth daily.  1  . buPROPion (WELLBUTRIN XL) 300 MG 24 hr tablet Take 300 mg by mouth daily.    . citalopram (CELEXA) 20 MG tablet Take 20 mg by mouth daily.    Marland Kitchen loratadine (CLARITIN) 10 MG tablet Take 10 mg by mouth daily.    . metFORMIN (GLUCOPHAGE) 1000 MG tablet Take 1 tablet by mouth 2 (two) times daily.  3  . metoprolol (LOPRESSOR) 100 MG tablet Take 100 mg by mouth 2 (two) times daily.    . norethindrone (MICRONOR,CAMILA,ERRIN) 0.35 MG tablet Take 1 tablet (0.35 mg total) by mouth daily. 3 Package 3  . oxyCODONE-acetaminophen (ROXICET) 5-325 MG per tablet Take 1 tablet by mouth every 4 (four) hours as needed for severe pain. 20 tablet 0  . spironolactone (ALDACTONE) 100 MG tablet Take 100 mg by mouth 2 (two) times daily.     No current facility-administered medications for this visit.    Family History  Problem Relation Age of Onset  . Hypertension Mother   . Infertility Mother   . Deep vein thrombosis Mother   . Anxiety disorder Mother   . Depression Mother   . Hepatitis C Mother     blood transfusion  . Aneurysm Mother     [place]  . Cancer Father  gallbladder, renal  . Hypertension Father   . Anxiety disorder Father   . Depression Father   . Multiple births Sister   . Cancer Sister     thyroid cancer  . Thyroid disease Sister   . Anxiety disorder Sister   . Depression Sister   . Hypertension Brother   . Anxiety disorder Brother   . Depression Brother     ROS:  Pertinent items are noted in HPI.  Otherwise, a comprehensive ROS was negative.  Exam:   BP 116/62 mmHg  Pulse 80  Ht 5\' 2"  (1.575 m)  Wt 197 lb (89.359 kg)  BMI 36.02 kg/m2  LMP 04/04/2014 Height: 5\' 2"  (157.5 cm) Ht Readings from Last 3 Encounters:  04/14/14 5\' 2"  (1.575 m)  06/07/13 5\' 2"  (1.575 m)  04/07/13 5' 2.25" (1.581 m)    General appearance: alert, cooperative and appears stated  age Head: Normocephalic, without obvious abnormality, atraumatic Neck: no adenopathy, supple, symmetrical, trachea midline and thyroid normal to inspection and palpation Lungs: clear to auscultation bilaterally Breasts: normal appearance, no masses or tenderness Heart: regular rate and rhythm Abdomen: soft, non-tender; no masses,  no organomegaly Extremities: extremities normal, atraumatic, no cyanosis or edema Skin: Skin color, texture, turgor normal. No rashes or lesions Lymph nodes: Cervical, supraclavicular, and axillary nodes normal. No abnormal inguinal nodes palpated Neurologic: Grossly normal   Pelvic: External genitalia:  no lesions              Urethra:  normal appearing urethra with no masses, tenderness or lesions              Bartholin's and Skene's: normal                 Vagina: normal appearing vagina with normal color and discharge, no lesions              Cervix: anteverted              Pap taken: Yes.   Bimanual Exam:  Uterus:  normal size, contour, position, consistency, mobility, non-tender              Adnexa: no mass, fullness, tenderness               Rectovaginal: Confirms               Anus:  normal sphincter tone, no lesions  Chaperone present:  Yes  A:  Well Woman with normal exam  History of PCOS  History of disordered proliferative endometrium 04/2010 on POP History of situational stressors - doing well on med's History of DM, renal calculi, HTN Last year weight loss of 20 lbs since being followed by Endocrinologist  History of abnormal pap with ASCUS neg HR HPV 2014 and normal in 2015   P:   Reviewed health and wellness pertinent to exam  Pap smear taken today  Mammogram is due 1/16  Will get PUS/ SHGM and endo biopsy - she will need Cytotec PLEASE do [place] per Dr. Sabra Heck.  Counseled on breast self exam, mammography screening, adequate intake of calcium and vitamin D, diet and exercise return  annually or prn  An After Visit Summary was printed and given to the patient.

## 2014-04-15 LAB — URINALYSIS, MICROSCOPIC ONLY
Casts: NONE SEEN
Crystals: NONE SEEN
Squamous Epithelial / LPF: NONE SEEN

## 2014-04-15 NOTE — Progress Notes (Signed)
Reviewed personally.  M. Suzanne Ryker [place], MD.  

## 2014-04-16 LAB — URINE CULTURE
Colony Count: NO GROWTH
Organism ID, Bacteria: NO GROWTH

## 2014-04-18 ENCOUNTER — Encounter: Payer: Self-pay | Admitting: *Deleted

## 2014-04-20 ENCOUNTER — Telehealth: Payer: Self-pay | Admitting: Nurse Practitioner

## 2014-04-20 DIAGNOSIS — N939 Abnormal uterine and vaginal bleeding, unspecified: Secondary | ICD-10-CM

## 2014-04-20 NOTE — Telephone Encounter (Signed)
Call to patient. Advised that per benefit quote received, she will be responsible to pay a $25 copay when she comes in for SHGM/EMB. Patient agreeable. Scheduled SHGM/EMB. Advised patient of 72 hour cancellation policy and $257 cancellation fee. Patient agreeable.

## 2014-04-21 NOTE — Addendum Note (Signed)
Addended by: Kem Boroughs R on: 04/21/2014 01:22 PM   Modules accepted: Orders

## 2014-04-21 NOTE — Addendum Note (Signed)
Addended by: Michele Mcalpine on: 04/21/2014 12:12 PM   Modules accepted: Orders

## 2014-04-27 LAB — IPS PAP TEST WITH HPV

## 2014-05-05 ENCOUNTER — Ambulatory Visit (INDEPENDENT_AMBULATORY_CARE_PROVIDER_SITE_OTHER): Payer: 59 | Admitting: Obstetrics & Gynecology

## 2014-05-05 ENCOUNTER — Ambulatory Visit (INDEPENDENT_AMBULATORY_CARE_PROVIDER_SITE_OTHER): Payer: 59

## 2014-05-05 ENCOUNTER — Other Ambulatory Visit: Payer: Self-pay | Admitting: Obstetrics & Gynecology

## 2014-05-05 VITALS — BP 102/72 | Ht 62.0 in | Wt 199.0 lb

## 2014-05-05 DIAGNOSIS — N838 Other noninflammatory disorders of ovary, fallopian tube and broad ligament: Secondary | ICD-10-CM

## 2014-05-05 DIAGNOSIS — N939 Abnormal uterine and vaginal bleeding, unspecified: Secondary | ICD-10-CM

## 2014-05-05 DIAGNOSIS — N839 Noninflammatory disorder of ovary, fallopian tube and broad ligament, unspecified: Secondary | ICD-10-CM

## 2014-05-05 NOTE — Progress Notes (Signed)
49 y.o. [id number] here for PUS due to [facility] here for a pelvic ultrasound due to DUB on POPs.  Pt's spouse has had a vasectomy but pt is on POPs for treatment of PCOS.  Pt with known hx of ovarian cysts.  Last PUS was done 1/181/2 showing bilateral cysts measuring on right 70mm x 2.  Left ovary 2.4cm and 1.4cm.  The larger had a single thin septation present.    Pt denies pain, urinary changes, or bowel changes.  Pt has worked on weight loss in last year and has lost about 20 pounds.     Patient's last menstrual period was 04/04/2014.  Sexually active:  yes  Contraception: vasectomy  FINDINGS: UTERUS: 9.9 x 6.3 x 5.1cm EMS: 4.62mm ADNEXA:   Left ovary 4.3 x 4.9 x 3.5cm with 10.3 x 7.6 x 9.5cm cyst that appears to be arising form the ovary.  This cyst is avascular and smooth-walled.   Right ovary 7.2 x 4.7 x 5.6cm with thin walled cyst 4.4 cm, smooth walled but there are several septations on the superior edge of the cyst.  Entire lesion is avascular. CUL DE SAC: no free fluid  D/w pt findings and images reviewed.  Due to size of large cyst on left, surgical removal is recommended.  There is the possibility this is a large paratubal cyst as it appears to come off the edge of the ovary.  Also, cystadenoma is a possibility.  With septations on other ovary, there is a possibility this ovary will need removal as well.  Will check ca-125 first and proceed from there.  Pt is aware if bilateral BSO were performed, she may need HRT.  She is a little worried about this happening but is ready to proceed when results are back, if this is best thing.  Surgical approaches discussed.  Would start laparoscopically with LUQ entry to see if could proceed laparoscopically.  Mini-laparotomy also discussed.  All questions answered.    Assessment: bilateral ovarian cysts, left 10cm, right 4.4cm with septations present Plan: ca-125.  Depending on result, proceed with surgical planning vs referral to gyn onc.  ~25  minutes spent with patient >50% of time was in face to face discussion of above.

## 2014-05-06 LAB — CA 125: CA 125: 25 U/mL (ref <35)

## 2014-05-11 ENCOUNTER — Telehealth: Payer: Self-pay | Admitting: Obstetrics & Gynecology

## 2014-05-11 NOTE — Telephone Encounter (Signed)
Patient is waiting on call regarding a referral. Last seen 05/05/14.

## 2014-05-11 NOTE — Telephone Encounter (Signed)
Patent was seen on 05/05/2014 for [facility] with Dr.Miller. Please review and advise.

## 2014-05-12 ENCOUNTER — Encounter: Payer: Self-pay | Admitting: Obstetrics & Gynecology

## 2014-05-12 NOTE — Telephone Encounter (Signed)
Call to patient. Notified of appointment date and registration instructions. Phone number to Dr Serita Grit office given. Patient is aware to expect pelvic exam.  Routing to provider for final review. Patient agreeable to disposition. Will close encounter

## 2014-05-12 NOTE — Telephone Encounter (Signed)
Nicole Khan has pt's information.  I want her to see gyn/onc for recommendations regarding bilateral ovarian cysts.  Ca-125 was 25.  Pt is aware of all of this.  Sending note to [place] and Nicole Khan but Nicole Khan will handle to referral and pt communication.

## 2014-05-12 NOTE — Telephone Encounter (Signed)
Call to Maudie Mercury at [place]. Appointment scheduled with Dr Denman George for 05-23-14 at 945, arrive at 915 to register.

## 2014-05-23 ENCOUNTER — Encounter: Payer: Self-pay | Admitting: Gynecologic Oncology

## 2014-05-23 ENCOUNTER — Ambulatory Visit: Payer: 59 | Attending: Gynecologic Oncology | Admitting: Gynecologic Oncology

## 2014-05-23 DIAGNOSIS — N83201 Unspecified ovarian cyst, right side: Secondary | ICD-10-CM

## 2014-05-23 DIAGNOSIS — F419 Anxiety disorder, unspecified: Secondary | ICD-10-CM | POA: Diagnosis not present

## 2014-05-23 DIAGNOSIS — N83202 Unspecified ovarian cyst, left side: Secondary | ICD-10-CM

## 2014-05-23 DIAGNOSIS — I10 Essential (primary) hypertension: Secondary | ICD-10-CM | POA: Insufficient documentation

## 2014-05-23 DIAGNOSIS — E780 Pure hypercholesterolemia: Secondary | ICD-10-CM | POA: Insufficient documentation

## 2014-05-23 DIAGNOSIS — Z793 Long term (current) use of hormonal contraceptives: Secondary | ICD-10-CM | POA: Diagnosis not present

## 2014-05-23 DIAGNOSIS — F329 Major depressive disorder, single episode, unspecified: Secondary | ICD-10-CM | POA: Insufficient documentation

## 2014-05-23 DIAGNOSIS — E119 Type 2 diabetes mellitus without complications: Secondary | ICD-10-CM | POA: Insufficient documentation

## 2014-05-23 DIAGNOSIS — E282 Polycystic ovarian syndrome: Secondary | ICD-10-CM | POA: Diagnosis not present

## 2014-05-23 DIAGNOSIS — N8320 Unspecified ovarian cysts: Secondary | ICD-10-CM | POA: Diagnosis present

## 2014-05-23 HISTORY — DX: Unspecified ovarian cyst, right side: N83.201

## 2014-05-23 NOTE — Progress Notes (Signed)
Consult Note: Gyn-Onc  Consult was requested by Dr. Sabra Heck for the evaluation of Nicole Khan 49 y.o. female with bilateral ovarian cysts  CC:  Chief Complaint  Patient presents with  . ovarian cyst    bilateral    Assessment/Plan:  Nicole Khan  is a 49 y.o.  year old with bilateral mostly cystic thin walled avascular ovarian cysts the largest of which is on the left and measures 10 cm.  The patient's CA-125 is normal at 25.  I believe that Nicole Khan has benign ovarian cysts. Given the size of the cysts particularly the one on the left I am recommending removal. Given her age I am recommending BSO rather than ovarian cystectomy no certainly ovarian cystectomy could be contemplated given our low suspicion for malignancy. It is reasonable for Dr. Sabra Heck to perform the surgery (ast the likelihood she will need staging is very low) though be happy to take on a surgical care here at [facility] if so desired.  We had a discussion about hormone replacement therapy postoperatively. I believe that she would benefit from hormone replacement therapy given her young age. I would recommend this until age of natural menopause.   HPI: Nicole Khan is a 49 year old gravida 4 para 2022 who is seen in consultation at the request of Dr. Sabra Heck for bilateral ovarian cysts. Patient has a long-standing history of polycystic ovarian disease. She is treated with oral progesterone. She began developing vaginal spotting and underwent a transvaginal ultrasound scan on every fourth 2016. This revealed a uterus measuring 10 x 6 x 5 cm with a 4.8 mm endometrial thickness. The left ovary was enlarged with a 10 x 7 x 9 cm avascular smooth thin walled cyst that was echo free. The right ovary also contained a thin walled avascular cyst this one measuring 4 cm containing multiple septations that within. A CA-125 was drawn and was normal at 25. The patient has a history of a renal ultrasound scan approximately  one year ago which is performed for renal calculus. At that time comments regarding the pelvic cysts were not made.   Current Meds:  Outpatient Encounter Prescriptions as of 05/23/2014  Medication Sig  . atorvastatin (LIPITOR) 40 MG tablet Take 1 tablet by mouth daily.  Marland Kitchen buPROPion (WELLBUTRIN XL) 300 MG 24 hr tablet Take 300 mg by mouth daily.  . citalopram (CELEXA) 20 MG tablet Take 20 mg by mouth daily.  Marland Kitchen loratadine (CLARITIN) 10 MG tablet Take 10 mg by mouth daily.  . metFORMIN (GLUCOPHAGE) 1000 MG tablet Take 1 tablet by mouth 2 (two) times daily.  . metoprolol (LOPRESSOR) 100 MG tablet Take 100 mg by mouth 2 (two) times daily.  . norethindrone (MICRONOR,CAMILA,ERRIN) 0.35 MG tablet Take 1 tablet (0.35 mg total) by mouth daily.  Marland Kitchen spironolactone (ALDACTONE) 100 MG tablet Take 100 mg by mouth 2 (two) times daily.  . [DISCONTINUED] oxyCODONE-acetaminophen (ROXICET) 5-325 MG per tablet Take 1 tablet by mouth every 4 (four) hours as needed for severe pain. (Patient not taking: Reported on 05/23/2014)    Allergy:  Allergies  Allergen Reactions  . Hazel Tree Pollen [Corylus]     Hazelnuts, makes patients mouth swell  . Soy Allergy     Soy milk    Social Hx:   History   Social History  . Marital Status: Married    Spouse Name: N/A  . Number of Children: 2  . Years of Education: N/A   Occupational History  . Not  on file.   Social History Main Topics  . Smoking status: Never Smoker   . Smokeless tobacco: Never Used  . Alcohol Use: No  . Drug Use: No  . Sexual Activity: Yes    Birth Control/ Protection: Pill   Other Topics Concern  . Not on file   Social History Narrative    Past Surgical Hx:  Past Surgical History  Procedure Laterality Date  . Carpal tunnel release    . Cesarean section    . Cervical cerclage  07/1991; 05/1999    x 2  . Cholecystectomy, laparoscopic  1996  . Orif ankle fracture Right 06/07/2013    Procedure: OPEN REDUCTION INTERNAL FIXATION (ORIF)  ANKLE FRACTURE  FIBULA SYNDESMOSIS;  Surgeon: Ninetta Lights, MD;  Location: [place];  Service: Orthopedics;  Laterality: Right;    Past Medical Hx:  Past Medical History  Diagnosis Date  . Diabetes mellitus without complication   . Hypertension   . Polycystic ovary disease   . Hypercholesteremia   . Anxiety   . Depression   . Fracture dislocation of right ankle joint 06-04-2013  . Fracture of distal fibula 06-04-2013    Past Gynecological History:  SVD x 1, cesarean x 1  No LMP recorded.  Family Hx:  Family History  Problem Relation Age of Onset  . Hypertension Mother   . Infertility Mother   . Deep vein thrombosis Mother   . Anxiety disorder Mother   . Depression Mother   . Hepatitis C Mother     blood transfusion  . Aneurysm Mother     [place]  . Cancer Father     gallbladder, renal  . Hypertension Father   . Anxiety disorder Father   . Depression Father   . Multiple births Sister   . Cancer Sister     thyroid cancer  . Thyroid disease Sister   . Anxiety disorder Sister   . Depression Sister   . Hypertension Brother   . Anxiety disorder Brother   . Depression Brother     Review of Systems:  Constitutional  Feels well,    ENT Normal appearing ears and nares bilaterally Skin/Breast  No rash, sores, jaundice, itching, dryness Cardiovascular  No chest pain, shortness of breath, or edema  Pulmonary  No cough or wheeze.  Gastro Intestinal  No nausea, vomitting, or diarrhoea. No bright red blood per rectum, no abdominal pain, change in bowel movement, or constipation.  Genito Urinary  No frequency, urgency, dysuria, vaginal spotting on progesterone Musculo Skeletal  No myalgia, arthralgia, joint swelling or pain  Neurologic  No weakness, numbness, change in gait,  Psychology  No depression, anxiety, insomnia.   Vitals:  There were no vitals taken for this visit.  Physical Exam: WD in NAD Neck  Supple NROM, without any enlargements.   Lymph Node Survey No cervical supraclavicular or inguinal adenopathy Cardiovascular  Pulse normal rate, regularity and rhythm. S1 and S2 normal.  Lungs  Clear to auscultation bilateraly, without wheezes/crackles/rhonchi. Good air movement.  Skin  No rash/lesions/breakdown  Psychiatry  Alert and oriented to person, place, and time  Abdomen  Normoactive bowel sounds, abdomen soft, non-tender and obese without evidence of hernia. Striae on abdomen. Back No CVA tenderness Genito Urinary  Vulva/vagina: Normal external female genitalia.  No lesions. No discharge or bleeding.  Bladder/urethra:  No lesions or masses, well supported bladder  Vagina: no lesions  Cervix: Normal appearing, no lesions.  Uterus: Small, mobile, no parametrial  involvement or nodularity.  Adnexa: Fullness in the cul de sac, but no discrete masses appreciated (though exam is limited by body habitus). Rectal  Good tone, no masses no cul de sac nodularity.  Extremities  No bilateral cyanosis, clubbing or edema.   Donaciano Eva, MD   05/23/2014, 10:48 AM

## 2014-05-23 NOTE — Patient Instructions (Signed)
Please call our office with any concerns.

## 2014-05-25 ENCOUNTER — Telehealth: Payer: Self-pay | Admitting: Obstetrics & Gynecology

## 2014-05-25 NOTE — Telephone Encounter (Signed)
Call to patient. She states she is ready to proceed with surgery as previously discussed with Dr Sabra Heck. Requests first available date.  Advised will proceed with scheduling and call her back.

## 2014-05-25 NOTE — Telephone Encounter (Signed)
Routing to [employer] for review and scheduling.

## 2014-05-25 NOTE — Telephone Encounter (Signed)
Pt states she is calling to schedule an appointment for surgery. She is not sure if it is for a consult or for the surgery. Pt sees a gyn oncologist and is not sure how her care is being coordinated.

## 2014-05-26 NOTE — Telephone Encounter (Signed)
Left message for patient to call back. Need to go over surgery benefits. °

## 2014-05-26 NOTE — Telephone Encounter (Signed)
Patient returned call. She was advised of her OOP responsibility for the surgeons portion of her surgery. Payment was made. Receipt was mailed.

## 2014-05-27 NOTE — Telephone Encounter (Signed)
Call to patient. Update given that still working on surgrey date. Scheduled for 06-13-14 but attempting to move to 06-07-14 as she requested. Surgery instructions reviewed with patient. Will update patient next week.

## 2014-05-30 NOTE — Telephone Encounter (Signed)
Spoke with patient.  She is advised that surgery date has been moved to 06/07/14. Advised hospital will call with exact time of arrival. Patient is agreeable.  Lamont Snowball, RN called and spoke with Delilah Shan at [facility] OR and date changed to 06/07/14.

## 2014-05-31 NOTE — Telephone Encounter (Signed)
Call to patient. Confirmation of surgery date of 06-07-14 at 1030. Reviewed surgery instructions, including bowel prep as directed by Dr Sabra Heck. Printed instruction sheet mailed. Patient to call if additional questions. Has surgery consult with Dr Sabra Heck on Friday, 06-03-14.  Routing to provider for final review. Patient agreeable to disposition. Will close encounter

## 2014-06-02 ENCOUNTER — Encounter (HOSPITAL_COMMUNITY): Payer: Self-pay

## 2014-06-02 ENCOUNTER — Encounter (HOSPITAL_COMMUNITY)
Admission: RE | Admit: 2014-06-02 | Discharge: 2014-06-02 | Disposition: A | Discharge status: Home / Self Care | Payer: 59 | Source: Ambulatory Visit | Attending: Obstetrics & Gynecology | Admitting: Obstetrics & Gynecology

## 2014-06-02 ENCOUNTER — Telehealth: Payer: Self-pay | Admitting: Obstetrics and Gynecology

## 2014-06-02 ENCOUNTER — Other Ambulatory Visit: Payer: Self-pay

## 2014-06-02 DIAGNOSIS — Z01818 Encounter for other preprocedural examination: Secondary | ICD-10-CM | POA: Diagnosis present

## 2014-06-02 DIAGNOSIS — N8320 Unspecified ovarian cysts: Secondary | ICD-10-CM | POA: Diagnosis not present

## 2014-06-02 HISTORY — DX: Other specified postprocedural states: Z98.890

## 2014-06-02 HISTORY — DX: Personal history of urinary calculi: Z87.442

## 2014-06-02 HISTORY — DX: Nausea with vomiting, unspecified: R11.2

## 2014-06-02 HISTORY — DX: Other seasonal allergic rhinitis: J30.2

## 2014-06-02 LAB — CBC
HCT: 42.3 % (ref 36.0–46.0)
Hemoglobin: 14.4 g/dL (ref 12.0–15.0)
MCH: 32.3 pg (ref 26.0–34.0)
MCHC: 34 g/dL (ref 30.0–36.0)
MCV: 94.8 fL (ref 78.0–100.0)
Platelets: 350 10*3/uL (ref 150–400)
RBC: 4.46 MIL/uL (ref 3.87–5.11)
RDW: 13 % (ref 11.5–15.5)
WBC: 11.9 10*3/uL — ABNORMAL HIGH (ref 4.0–10.5)

## 2014-06-02 LAB — BASIC METABOLIC PANEL
Anion gap: 11 (ref 5–15)
BUN: 15 mg/dL (ref 6–23)
CO2: 19 mmol/L (ref 19–32)
Calcium: 9.3 mg/dL (ref 8.4–10.5)
Chloride: 105 mmol/L (ref 96–112)
Creatinine, Ser: 0.87 mg/dL (ref 0.50–1.10)
GFR calc Af Amer: >90 mL/min (ref >90)
GFR calc non Af Amer: 78 mL/min — ABNORMAL LOW (ref >90)
Glucose, Bld: 155 mg/dL — ABNORMAL HIGH (ref 70–99)
Potassium: 4.5 mmol/L (ref 3.5–5.1)
Sodium: 135 mmol/L (ref 135–145)

## 2014-06-02 NOTE — Patient Instructions (Addendum)
   Your procedure is scheduled on: Tuesday, March 8  Enter through the Main Entrance of [facility] at:  9 AM Pick up the phone at the desk and dial [phone] and inform us of your arrival.  Please call this number if you have any problems the morning of surgery: [phone]  Remember: Do not eat or drink after midnight: Monday Take these medicines the morning of surgery with a SIP OF WATER:  Wellbutrin, celexa, claritin, lopressor and aldectone.  Patient instructed to withhold metformin on Moonday night and Tuesday morning (day of surgery).  We will check your sugar upon arrival to Short Stay Dept.  Do not wear jewelry, make-up, or FINGER nail polish No metal in your hair or on your body. Do not wear lotions, powders, perfumes.  You may wear deodorant.  Do not bring valuables to the hospital. Contacts, dentures or bridgework may not be worn into surgery.  For patients being admitted to the hospital, checkout time is 11:00am the day of discharge.    Patients discharged on the day of surgery will not be allowed to drive home.  Home with husband  Le[place] cell [phone]

## 2014-06-03 ENCOUNTER — Ambulatory Visit (INDEPENDENT_AMBULATORY_CARE_PROVIDER_SITE_OTHER): Payer: 59 | Admitting: Obstetrics & Gynecology

## 2014-06-03 ENCOUNTER — Encounter: Payer: Self-pay | Admitting: Obstetrics & Gynecology

## 2014-06-03 VITALS — BP 120/78 | HR 64 | Resp 16 | Wt 198.4 lb

## 2014-06-03 DIAGNOSIS — N83201 Unspecified ovarian cyst, right side: Secondary | ICD-10-CM

## 2014-06-03 DIAGNOSIS — N83202 Unspecified ovarian cyst, left side: Principal | ICD-10-CM

## 2014-06-03 DIAGNOSIS — N8320 Unspecified ovarian cysts: Secondary | ICD-10-CM | POA: Diagnosis not present

## 2014-06-03 MED ORDER — CETIRIZINE HCL 10 MG PO TABS: 10.0000 mg | ORAL_TABLET | Freq: Every day | ORAL | Status: DC

## 2014-06-03 MED ORDER — OXYCODONE-ACETAMINOPHEN 5-325 MG PO TABS: 2.0000 | ORAL_TABLET | ORAL | Status: DC | PRN

## 2014-06-03 MED ORDER — IBUPROFEN 800 MG PO TABS: 800.0000 mg | ORAL_TABLET | Freq: Three times a day (TID) | ORAL | Status: DC | PRN

## 2014-06-03 NOTE — Progress Notes (Signed)
49 y.o. [id number] MarriedCaucasian female here for discussion of upcoming procedure.  Laparoscopic BSO/collection of pelvic washings planned due to bilateral large ovarian cysts noted on pelvic ultrasound obtained 05/05/14.  Left ovary measured 4.3 x 4.9 x 3.5cm with 10.3 x 7.6 x 9.5cm cyst that is avascular and smooth walled.  Right ovary measured 7.2 x 4.7 x 5.6cm with 4.4cm thin walled cyst with several septations on the superior edge of the cyst.  Ca-125 was 25.  Pt did see Dr. Everitt Amber in consultation who recommended proceeding with bilateral salpingoophrectomy.  Findings reviewed with pt and her husband.  Procedure discussed with pt and her spouse.  They are aware BSO may end up being performed and pt will then need HRT.  She is comfortable with this at this time.  Laparoscopic approach vs laparotomy discussed.  Hospital stay, recovery and pain management all discussed.  Risks discussed including but not limited to bleeding, <1% risk of receiving a  transfusion, infection, 1-2% risk of bowel/bladder/ureteral/vascular injury discussed as well as possible need for additional surgery if injury does occur discussed.  DVT/PE and rare risk of death discussed.  My actual complications with prior surgeries discussed.  Hernia formation discussed.  Positioning and incision locations discussed.  Patient aware if pathology abnormal she may need additional treatment.  All questions answered.    Ob Hx:   Patient's last menstrual period was 05/23/2014.          Sexually active: Yes.   Birth control: oral progesterone-only contraceptive, and vasectomy Last pap: 04/14/14 WNL/negative HR HPV Last MMG: 04/28/13 3D-normal Tobacco: never  Past Surgical History  Procedure Laterality Date  . Carpal tunnel release      bilateral  . Cesarean section      x 1  . Cervical cerclage  07/1991; 05/1999    x 2  . Cholecystectomy, laparoscopic  1996  . Orif ankle fracture Right 06/07/2013    Procedure: OPEN REDUCTION INTERNAL  FIXATION (ORIF) ANKLE FRACTURE  FIBULA SYNDESMOSIS;  Surgeon: Ninetta Lights, MD;  Location: [place];  Service: Orthopedics;  Laterality: Right;  . Cholecystectomy      Past Medical History  Diagnosis Date  . Hypertension   . Polycystic ovary disease   . Hypercholesteremia   . Anxiety   . Depression   . Fracture dislocation of right ankle joint 06-04-2013  . Fracture of distal fibula 06-04-2013  . SVD (spontaneous vaginal delivery)     x 1  . PONV (postoperative nausea and vomiting)   . Seasonal allergies   . Diabetes mellitus without complication     type 2  . History of kidney stones     passed stones, no surgery required    Allergies: Soy allergy; Celery oil; Eggs or egg-derived products; and Hazel tree pollen  Current Outpatient Prescriptions  Medication Sig Dispense Refill  . acetaminophen (TYLENOL) 500 MG tablet Take 1,000 mg by mouth every 6 (six) hours as needed for headache.    Marland Kitchen atorvastatin (LIPITOR) 40 MG tablet Take 1 tablet by mouth at bedtime.   1  . Biotin 10 MG TABS Take 1 tablet by mouth daily.    Marland Kitchen buPROPion (WELLBUTRIN XL) 300 MG 24 hr tablet Take 300 mg by mouth daily.    . calcium carbonate (TUMS - DOSED IN MG ELEMENTAL CALCIUM) 500 MG chewable tablet Chew 2 tablets by mouth daily as needed for indigestion or heartburn.    . citalopram (CELEXA) 20 MG tablet Take 20  mg by mouth daily.    Marland Kitchen loratadine (CLARITIN) 10 MG tablet Take 10 mg by mouth daily.    . metFORMIN (GLUCOPHAGE) 1000 MG tablet Take 1 tablet by mouth 2 (two) times daily.  3  . metoprolol (LOPRESSOR) 100 MG tablet Take 100 mg by mouth 2 (two) times daily.    . norethindrone (MICRONOR,CAMILA,ERRIN) 0.35 MG tablet Take 1 tablet (0.35 mg total) by mouth daily. (Patient taking differently: Take 1 tablet by mouth at bedtime. ) 3 Package 3  . spironolactone (ALDACTONE) 100 MG tablet Take 100 mg by mouth 2 (two) times daily.     No current facility-administered medications for this  visit.    ROS: A comprehensive review of systems was negative.  Exam:    BP 120/78 mmHg  Pulse 64  Resp 16  Wt 198 lb 6.4 oz (89.994 kg)  LMP 05/23/2014  General appearance: alert and cooperative Head: Normocephalic, without obvious abnormality, atraumatic Neck: no adenopathy, supple, symmetrical, trachea midline and thyroid not enlarged, symmetric, no tenderness/mass/nodules Lungs: clear to auscultation bilaterally Heart: regular rate and rhythm, S1, S2 normal, no murmur, click, rub or gallop Abdomen: soft, non-tender; bowel sounds normal; no masses,  no organomegaly Extremities: extremities normal, atraumatic, no cyanosis or edema Skin: Skin color, texture, turgor normal. No rashes or lesions Lymph nodes: Cervical, supraclavicular, and axillary nodes normal. no inguinal nodes palpated Neurologic: Grossly normal  Pelvic: External genitalia:  no lesions              Urethra: normal appearing urethra with no masses, tenderness or lesions              Bartholins and Skenes: normal                 Vagina: normal appearing vagina with normal color and discharge, no lesions              Cervix: normal appearance              Pap taken: No.        Bimanual Exam:  Uterus:  uterus is normal size, shape, consistency and nontender                                      Adnexa:    Non tender.  No mass can be palpated                                      Rectovaginal: Deferred  A: Ovarian Cysts, left 10cm simple cyst and right 4.4cm complex cyst     P:  Laparoscopic BSO, possible USO, possible laparotomy planned Rx for Motrin and Percocet given. Medications/Vitamins reviewed.  Pt knows needs to stop all asa products.   ~30 minutes spent with patient >50% of time was in face to face discussion of above.

## 2014-06-06 ENCOUNTER — Encounter: Payer: Self-pay | Admitting: Obstetrics & Gynecology

## 2014-06-07 ENCOUNTER — Ambulatory Visit (HOSPITAL_COMMUNITY): Payer: 59 | Admitting: Certified Registered Nurse Anesthetist

## 2014-06-07 ENCOUNTER — Ambulatory Visit (HOSPITAL_COMMUNITY)
Admission: RE | Admit: 2014-06-07 | Discharge: 2014-06-07 | Disposition: A | Discharge status: Home / Self Care | Payer: 59 | Source: Ambulatory Visit | Attending: Obstetrics & Gynecology | Admitting: Obstetrics & Gynecology

## 2014-06-07 ENCOUNTER — Encounter (HOSPITAL_COMMUNITY): Payer: Self-pay | Admitting: Emergency Medicine

## 2014-06-07 ENCOUNTER — Encounter (HOSPITAL_COMMUNITY)
Admission: RE | Disposition: A | Discharge status: Home / Self Care | Payer: Self-pay | Source: Ambulatory Visit | Attending: Obstetrics & Gynecology

## 2014-06-07 DIAGNOSIS — D271 Benign neoplasm of left ovary: Secondary | ICD-10-CM | POA: Diagnosis not present

## 2014-06-07 DIAGNOSIS — E119 Type 2 diabetes mellitus without complications: Secondary | ICD-10-CM | POA: Insufficient documentation

## 2014-06-07 DIAGNOSIS — K660 Peritoneal adhesions (postprocedural) (postinfection): Secondary | ICD-10-CM | POA: Insufficient documentation

## 2014-06-07 DIAGNOSIS — D270 Benign neoplasm of right ovary: Secondary | ICD-10-CM | POA: Diagnosis not present

## 2014-06-07 DIAGNOSIS — Z91012 Allergy to eggs: Secondary | ICD-10-CM | POA: Diagnosis not present

## 2014-06-07 DIAGNOSIS — Z91018 Allergy to other foods: Secondary | ICD-10-CM | POA: Insufficient documentation

## 2014-06-07 DIAGNOSIS — Z87442 Personal history of urinary calculi: Secondary | ICD-10-CM | POA: Insufficient documentation

## 2014-06-07 DIAGNOSIS — N8320 Unspecified ovarian cysts: Secondary | ICD-10-CM | POA: Diagnosis not present

## 2014-06-07 DIAGNOSIS — E780 Pure hypercholesterolemia: Secondary | ICD-10-CM | POA: Diagnosis not present

## 2014-06-07 DIAGNOSIS — Z9049 Acquired absence of other specified parts of digestive tract: Secondary | ICD-10-CM | POA: Insufficient documentation

## 2014-06-07 DIAGNOSIS — N838 Other noninflammatory disorders of ovary, fallopian tube and broad ligament: Secondary | ICD-10-CM | POA: Insufficient documentation

## 2014-06-07 DIAGNOSIS — F419 Anxiety disorder, unspecified: Secondary | ICD-10-CM | POA: Diagnosis not present

## 2014-06-07 DIAGNOSIS — I10 Essential (primary) hypertension: Secondary | ICD-10-CM | POA: Diagnosis not present

## 2014-06-07 DIAGNOSIS — F329 Major depressive disorder, single episode, unspecified: Secondary | ICD-10-CM | POA: Insufficient documentation

## 2014-06-07 HISTORY — PX: LAPAROSCOPIC BILATERAL SALPINGO OOPHERECTOMY: SHX5890

## 2014-06-07 HISTORY — PX: CYSTOSCOPY: SHX5120

## 2014-06-07 HISTORY — PX: LAPAROSCOPIC UNILATERAL SALPINGECTOMY: SHX5934

## 2014-06-07 LAB — GLUCOSE, CAPILLARY
Glucose-Capillary: 109 mg/dL — ABNORMAL HIGH (ref 70–99)
Glucose-Capillary: 141 mg/dL — ABNORMAL HIGH (ref 70–99)

## 2014-06-07 LAB — PREGNANCY, URINE: Preg Test, Ur: NEGATIVE

## 2014-06-07 SURGERY — SALPINGO-OOPHORECTOMY, BILATERAL, LAPAROSCOPIC
Anesthesia: General | Site: Ureter | Laterality: Right

## 2014-06-07 MED ORDER — PROPOFOL 10 MG/ML IV BOLUS
INTRAVENOUS | Status: AC
  Filled 2014-06-07: qty 40

## 2014-06-07 MED ORDER — FENTANYL CITRATE 0.05 MG/ML IJ SOLN
INTRAMUSCULAR | Status: DC | PRN
  Administered 2014-06-07 (×3): 50 ug via INTRAVENOUS
  Administered 2014-06-07: 100 ug via INTRAVENOUS

## 2014-06-07 MED ORDER — GLYCOPYRROLATE 0.2 MG/ML IJ SOLN
INTRAMUSCULAR | Status: AC
  Filled 2014-06-07: qty 3

## 2014-06-07 MED ORDER — CEFAZOLIN SODIUM-DEXTROSE 2-3 GM-% IV SOLR
INTRAVENOUS | Status: AC
  Filled 2014-06-07: qty 50

## 2014-06-07 MED ORDER — GLYCOPYRROLATE 0.2 MG/ML IJ SOLN
INTRAMUSCULAR | Status: AC
  Filled 2014-06-07: qty 1

## 2014-06-07 MED ORDER — MIDAZOLAM HCL 2 MG/2ML IJ SOLN
INTRAMUSCULAR | Status: DC | PRN
  Administered 2014-06-07: 2 mg via INTRAVENOUS

## 2014-06-07 MED ORDER — STERILE WATER FOR IRRIGATION IR SOLN
Status: DC | PRN
  Administered 2014-06-07: 3000 mL via INTRAVESICAL

## 2014-06-07 MED ORDER — SCOPOLAMINE 1 MG/3DAYS TD PT72
1.0000 | MEDICATED_PATCH | Freq: Once | TRANSDERMAL | Status: DC
  Administered 2014-06-07: 1.5 mg via TRANSDERMAL

## 2014-06-07 MED ORDER — KETOROLAC TROMETHAMINE 30 MG/ML IJ SOLN
INTRAMUSCULAR | Status: DC | PRN
  Administered 2014-06-07: 30 mg via INTRAVENOUS

## 2014-06-07 MED ORDER — KETOROLAC TROMETHAMINE 30 MG/ML IJ SOLN
INTRAMUSCULAR | Status: AC
  Filled 2014-06-07: qty 1

## 2014-06-07 MED ORDER — SCOPOLAMINE 1 MG/3DAYS TD PT72
MEDICATED_PATCH | TRANSDERMAL | Status: AC
  Administered 2014-06-07: 1.5 mg via TRANSDERMAL
  Filled 2014-06-07: qty 1

## 2014-06-07 MED ORDER — FENTANYL CITRATE 0.05 MG/ML IJ SOLN: 25.0000 ug | INTRAMUSCULAR | Status: DC | PRN

## 2014-06-07 MED ORDER — CEFAZOLIN SODIUM-DEXTROSE 2-3 GM-% IV SOLR
2.0000 g | INTRAVENOUS | Status: AC
  Administered 2014-06-07: 2 g via INTRAVENOUS

## 2014-06-07 MED ORDER — FENTANYL CITRATE 0.05 MG/ML IJ SOLN
25.0000 ug | INTRAMUSCULAR | Status: DC | PRN
  Administered 2014-06-07: 50 ug via INTRAVENOUS

## 2014-06-07 MED ORDER — FENTANYL CITRATE 0.05 MG/ML IJ SOLN
INTRAMUSCULAR | Status: AC
  Filled 2014-06-07: qty 2

## 2014-06-07 MED ORDER — DEXAMETHASONE SODIUM PHOSPHATE 10 MG/ML IJ SOLN
INTRAMUSCULAR | Status: DC | PRN
  Administered 2014-06-07: 4 mg via INTRAVENOUS

## 2014-06-07 MED ORDER — DEXAMETHASONE SODIUM PHOSPHATE 10 MG/ML IJ SOLN
INTRAMUSCULAR | Status: AC
  Filled 2014-06-07: qty 1

## 2014-06-07 MED ORDER — PROPOFOL 10 MG/ML IV BOLUS
INTRAVENOUS | Status: DC | PRN
  Administered 2014-06-07: 200 mg via INTRAVENOUS

## 2014-06-07 MED ORDER — SODIUM CHLORIDE 0.9 % IV SOLN: 250.0000 mL | INTRAVENOUS | Status: DC | PRN

## 2014-06-07 MED ORDER — 0.9 % SODIUM CHLORIDE (POUR BTL) OPTIME
TOPICAL | Status: DC | PRN
  Administered 2014-06-07: 1000 mL

## 2014-06-07 MED ORDER — ONDANSETRON HCL 4 MG/2ML IJ SOLN
INTRAMUSCULAR | Status: DC | PRN
  Administered 2014-06-07: 4 mg via INTRAVENOUS

## 2014-06-07 MED ORDER — MIDAZOLAM HCL 2 MG/2ML IJ SOLN
INTRAMUSCULAR | Status: AC
  Filled 2014-06-07: qty 2

## 2014-06-07 MED ORDER — EPHEDRINE SULFATE 50 MG/ML IJ SOLN
INTRAMUSCULAR | Status: DC | PRN
  Administered 2014-06-07: 10 mg via INTRAVENOUS

## 2014-06-07 MED ORDER — LIDOCAINE HCL (CARDIAC) 20 MG/ML IV SOLN
INTRAVENOUS | Status: DC | PRN
  Administered 2014-06-07: 80 mg via INTRAVENOUS

## 2014-06-07 MED ORDER — BUPIVACAINE HCL (PF) 0.25 % IJ SOLN
INTRAMUSCULAR | Status: DC | PRN
  Administered 2014-06-07: 15 mL

## 2014-06-07 MED ORDER — BUPIVACAINE HCL (PF) 0.25 % IJ SOLN
INTRAMUSCULAR | Status: AC
  Filled 2014-06-07: qty 30

## 2014-06-07 MED ORDER — ROCURONIUM BROMIDE 100 MG/10ML IV SOLN
INTRAVENOUS | Status: DC | PRN
  Administered 2014-06-07: 10 mg via INTRAVENOUS
  Administered 2014-06-07: 50 mg via INTRAVENOUS
  Administered 2014-06-07: 10 mg via INTRAVENOUS

## 2014-06-07 MED ORDER — METOCLOPRAMIDE HCL 5 MG/ML IJ SOLN
INTRAMUSCULAR | Status: DC | PRN
  Administered 2014-06-07: 10 mg via INTRAVENOUS

## 2014-06-07 MED ORDER — PHENYLEPHRINE HCL 10 MG/ML IJ SOLN
INTRAMUSCULAR | Status: DC | PRN
  Administered 2014-06-07 (×3): 40 mg via INTRAVENOUS
  Administered 2014-06-07: 80 mg via INTRAVENOUS
  Administered 2014-06-07: 40 mg via INTRAVENOUS

## 2014-06-07 MED ORDER — MEPERIDINE HCL 25 MG/ML IJ SOLN: 6.2500 mg | INTRAMUSCULAR | Status: DC | PRN

## 2014-06-07 MED ORDER — ONDANSETRON HCL 4 MG/2ML IJ SOLN
INTRAMUSCULAR | Status: AC
  Filled 2014-06-07: qty 2

## 2014-06-07 MED ORDER — ACETAMINOPHEN 650 MG RE SUPP
650.0000 mg | RECTAL | Status: DC | PRN
  Filled 2014-06-07: qty 1

## 2014-06-07 MED ORDER — ONDANSETRON HCL 4 MG/2ML IJ SOLN: 4.0000 mg | Freq: Once | INTRAMUSCULAR | Status: DC | PRN

## 2014-06-07 MED ORDER — ROCURONIUM BROMIDE 100 MG/10ML IV SOLN
INTRAVENOUS | Status: AC
  Filled 2014-06-07: qty 1

## 2014-06-07 MED ORDER — SODIUM CHLORIDE 0.9 % IJ SOLN
3.0000 mL | Freq: Two times a day (BID) | INTRAMUSCULAR | Status: DC
Start: 2014-06-07 — End: 2014-06-07

## 2014-06-07 MED ORDER — GLYCOPYRROLATE 0.2 MG/ML IJ SOLN
INTRAMUSCULAR | Status: DC | PRN
  Administered 2014-06-07: .8 mg via INTRAVENOUS

## 2014-06-07 MED ORDER — LIDOCAINE HCL (CARDIAC) 20 MG/ML IV SOLN
INTRAVENOUS | Status: AC
  Filled 2014-06-07: qty 5

## 2014-06-07 MED ORDER — LACTATED RINGERS IR SOLN
Status: DC | PRN
  Administered 2014-06-07: 3000 mL

## 2014-06-07 MED ORDER — FENTANYL CITRATE 0.05 MG/ML IJ SOLN
INTRAMUSCULAR | Status: AC
  Filled 2014-06-07: qty 5

## 2014-06-07 MED ORDER — NEOSTIGMINE METHYLSULFATE 10 MG/10ML IV SOLN
INTRAVENOUS | Status: DC | PRN
  Administered 2014-06-07: 4 mg via INTRAVENOUS

## 2014-06-07 MED ORDER — METOCLOPRAMIDE HCL 5 MG/ML IJ SOLN
INTRAMUSCULAR | Status: AC
  Filled 2014-06-07: qty 2

## 2014-06-07 MED ORDER — LACTATED RINGERS IV SOLN
INTRAVENOUS | Status: DC
  Administered 2014-06-07 (×3): via INTRAVENOUS

## 2014-06-07 MED ORDER — ACETAMINOPHEN 325 MG PO TABS: 650.0000 mg | ORAL_TABLET | ORAL | Status: DC | PRN

## 2014-06-07 MED ORDER — NEOSTIGMINE METHYLSULFATE 10 MG/10ML IV SOLN
INTRAVENOUS | Status: AC
  Filled 2014-06-07: qty 1

## 2014-06-07 MED ORDER — SODIUM CHLORIDE 0.9 % IJ SOLN: 3.0000 mL | INTRAMUSCULAR | Status: DC | PRN

## 2014-06-07 SURGICAL SUPPLY — 43 items
BLADE SURG 10 STRL SS (BLADE) ×10 IMPLANT
CABLE HIGH FREQUENCY MONO STRZ (ELECTRODE) ×5 IMPLANT
CANISTER SUCT 3000ML (MISCELLANEOUS) ×10 IMPLANT
CHLORAPREP W/TINT 26ML (MISCELLANEOUS) ×5 IMPLANT
CLOTH BEACON ORANGE TIMEOUT ST (SAFETY) ×5 IMPLANT
DEVICE TROCAR PUNCTURE CLOSURE (ENDOMECHANICALS) ×5 IMPLANT
DILATOR CANAL MILEX (MISCELLANEOUS) ×5 IMPLANT
DRSG COVADERM PLUS 2X2 (GAUZE/BANDAGES/DRESSINGS) ×5 IMPLANT
DRSG OPSITE POSTOP 3X4 (GAUZE/BANDAGES/DRESSINGS) ×5 IMPLANT
GLOVE BIOGEL PI IND STRL 7.0 (GLOVE) ×8 IMPLANT
GLOVE BIOGEL PI INDICATOR 7.0 (GLOVE) ×2
GLOVE ECLIPSE 6.5 STRL STRAW (GLOVE) ×10 IMPLANT
GOWN STRL REUS W/TWL LRG LVL3 (GOWN DISPOSABLE) ×5 IMPLANT
LIQUID BAND (GAUZE/BANDAGES/DRESSINGS) ×5 IMPLANT
NEEDLE HYPO 25X1 1.5 SAFETY (NEEDLE) ×5 IMPLANT
NEEDLE INSUFFLATION 120MM (ENDOMECHANICALS) ×5 IMPLANT
NS IRRIG 1000ML POUR BTL (IV SOLUTION) ×5 IMPLANT
PACK ABDOMINAL GYN (CUSTOM PROCEDURE TRAY) ×5 IMPLANT
PACK LAPAROSCOPY BASIN (CUSTOM PROCEDURE TRAY) ×5 IMPLANT
PAD OB MATERNITY 4.3X12.25 (PERSONAL CARE ITEMS) ×5 IMPLANT
PAD POSITIONER PINK NONSTERILE (MISCELLANEOUS) ×5 IMPLANT
PENCIL BUTTON HOLSTER BLD 10FT (ELECTRODE) ×5 IMPLANT
POUCH SPECIMEN RETRIEVAL 10MM (ENDOMECHANICALS) ×5 IMPLANT
PROTECTOR NERVE ULNAR (MISCELLANEOUS) ×5 IMPLANT
SET IRRIG TUBING LAPAROSCOPIC (IRRIGATION / IRRIGATOR) ×5 IMPLANT
SPONGE GAUZE 4X4 12PLY STER LF (GAUZE/BANDAGES/DRESSINGS) ×10 IMPLANT
SUT VIC AB 2-0 CT1 27 (SUTURE) ×1
SUT VIC AB 2-0 CT1 TAPERPNT 27 (SUTURE) ×4 IMPLANT
SUT VIC AB 3-0 PS2 18 (SUTURE) ×1
SUT VIC AB 3-0 PS2 18XBRD (SUTURE) ×4 IMPLANT
SUT VICRYL 0 UR6 27IN ABS (SUTURE) ×15 IMPLANT
SUT VICRYL 4-0 PS2 18IN ABS (SUTURE) ×5 IMPLANT
SYR 30ML LL (SYRINGE) ×5 IMPLANT
SYR CONTROL 10ML LL (SYRINGE) ×5 IMPLANT
TIP UTERINE 6.7X8CM BLUE DISP (MISCELLANEOUS) ×5 IMPLANT
TOWEL OR 17X24 6PK STRL BLUE (TOWEL DISPOSABLE) ×10 IMPLANT
TRAY FOLEY CATH 14FR (SET/KITS/TRAYS/PACK) ×5 IMPLANT
TROCAR 12M 150ML BLUNT (TROCAR) ×5 IMPLANT
TROCAR 5M 150ML BLDLS (TROCAR) ×10 IMPLANT
TROCAR XCEL NON-BLD 11X100MML (ENDOMECHANICALS) ×5 IMPLANT
TROCAR XCEL NON-BLD 5MMX100MML (ENDOMECHANICALS) ×10 IMPLANT
WARMER LAPAROSCOPE (MISCELLANEOUS) ×5 IMPLANT
WATER STERILE IRR 1000ML POUR (IV SOLUTION) ×5 IMPLANT

## 2014-06-07 NOTE — Anesthesia Procedure Notes (Signed)
Procedure Name: Intubation Date/Time: 06/07/2014 10:04 AM Performed by: Hewitt Blade Pre-anesthesia Checklist: Patient identified, Emergency Drugs available, Suction available and Patient being monitored Patient Re-evaluated:Patient Re-evaluated prior to inductionOxygen Delivery Method: Circle system utilized Preoxygenation: Pre-oxygenation with 100% oxygen Intubation Type: IV induction Ventilation: Oral airway inserted - appropriate to patient size and Two handed mask ventilation required Laryngoscope Size: Glidescope and 3 Tube type: Oral Tube size: 7.0 mm Number of attempts: 2 Airway Equipment and Method: Rigid stylet Placement Confirmation: ETT inserted through vocal cords under direct vision,  positive ETCO2 and breath sounds checked- equal and bilateral Secured at: 21 cm Tube secured with: Tape Dental Injury: Teeth and Oropharynx as per pre-operative assessment  Future Recommendations: Recommend- induction with short-acting agent, and alternative techniques readily available

## 2014-06-07 NOTE — Anesthesia Preprocedure Evaluation (Signed)
Anesthesia Evaluation  Patient identified by MRN, date of birth, ID band Patient awake    Reviewed: Allergy & Precautions, H&P , Patient's Chart, lab work & pertinent test results, reviewed documented beta blocker date and time   Airway Mallampati: IV  TM Distance: >3 FB Neck ROM: full    Dental no notable dental hx.    Pulmonary  breath sounds clear to auscultation  Pulmonary exam normal       Cardiovascular hypertension, On Medications Rhythm:regular Rate:Normal     Neuro/Psych    GI/Hepatic   Endo/Other  diabetes, Type 2Morbid obesity  Renal/GU      Musculoskeletal   Abdominal   Peds  Hematology   Anesthesia Other Findings Receive Propofol for GA in '15. No soy allergy problems  Reproductive/Obstetrics                             Anesthesia Physical Anesthesia Plan  ASA: III  Anesthesia Plan: General   Post-op Pain Management:    Induction: Intravenous  Airway Management Planned: Oral ETT  Additional Equipment:   Intra-op Plan:   Post-operative Plan: Extubation in OR  Informed Consent: I have reviewed the patients History and Physical, chart, labs and discussed the procedure including the risks, benefits and alternatives for the proposed anesthesia with the patient or authorized representative who has indicated his/her understanding and acceptance.   Dental Advisory Given and Dental advisory given  Plan Discussed with: CRNA and Surgeon  Anesthesia Plan Comments: (  Discussed general anesthesia, including possible nausea, instrumentation of airway, sore throat,pulmonary aspiration, etc. I asked if the were any outstanding questions, or  concerns before we proceeded. )        Anesthesia Quick Evaluation

## 2014-06-07 NOTE — Op Note (Signed)
06/07/2014  12:51 PM  PATIENT:  Nicole Khan  49 y.o. female  PRE-OPERATIVE DIAGNOSIS:  Bilateral Ovarian Cysts, Right 4.4 complex cyst, Left 10cm  POST-OPERATIVE DIAGNOSIS:  Right 4.4cm complex ovarian cyst, large left paratubal cyst  PROCEDURE:  Procedure(s): LAPAROSCOPIC RIGHT SALPINGO OOPHORECTOMY/COLLECTION OF PELVIC WASHINGS CYSTOSCOPY LAPAROSCOPIC UNILATERAL SALPINGECTOMY  SURGEON:  Omaria Plunk SUZANNE  ASSISTANTS: Josefa Half   ANESTHESIA:   general  ESTIMATED BLOOD LOSS: 50cc  BLOOD ADMINISTERED:none   FLUIDS: 2400cc LR  UOP: 200cc  SPECIMEN:  Right fallopian tube and ovary, left fallopian tube, pelvic washings  DISPOSITION OF SPECIMEN:  PATHOLOGY  FINDINGS: large left, smooth walled paratubal cyst, normal left ovary.  6-7cm right ovary with 4.4cm complex cyst, normal uterus, normal upper abdomen, omental adhesions in RLQ  DESCRIPTION OF OPERATION:  Patient is taken to the operating room. She is placed in the supine position. She is a running IV in place. Informed consent was present on the chart. SCDs on her lower extremities and functioning properly. General endotracheal anesthesia was administered by the anesthesia staff without difficulty. Dr. Lyndle Herrlich oversaw case. Once adequate anesthesia was confirmed the legs are placed in the low lithotomy position in [place].   Chlor prep was then used to prep the abdomen and Betadine was used to prep the inner thighs, perineum and vagina. Once 3 minutes had past the patient was draped in a normal standard fashion. The legs were lifted to the high lithotomy position. The cervix was visualized by placing a heavy weighted speculum in the posterior aspect of the vagina and using a curved Deaver retractor to the retract anteriorly. The anterior lip of the cervix was grasped with single-tooth tenaculum. The cervix was dilated with a Milex dilator and the uterus sounded to 8cm.  A Hulka clamp was attached to the  anterior lip of the uterus as a means of manipulating the uterus during the procedure.  The tenaculum was removed. There is also good manipulation of the uterus. The speculum and retractor were removed as well. A Foley catheter was placed to straight drain. The concentrated urine was noted. Legs were lowered to the low lithotomy position and attention was turned the abdomen.  Left upper quadrant entry was performed by nicking the skin with a #11 blade.  53mm non-bladed trochar and port was placed under direct entry.  Once appropriate placement was noted, pneumoperitoneum was achieved.  Pt was placed in Trendelenburg position.  Abdomen and pelvis was surveyed.  Left left paratubal cyst was note and mobile right tube and ovary were noted.  Enlarged right ovary was seen, as well.    There was very poor manipulation of the uterus so decision was made to replaced the Hulka clamp with a RUMI manipulator.  Pt was taken out of trendelenburg positioning.  Legs were lifted to the high lithotomy position.  Speculum was replaced.  Hulka clamp was removed.  Tenaculum was attached to the anterior lip of the cervix.  Uterus had been previously sounded to 8cm.  Pratt dilators were used to dilate the cervix up to a #21. A RUMI uterine manipulator was obtained. A #8 disposable tip was placed on the RUMI manipulator.  No KOH ring was used.  This was passed through the cervix and the bulb of the disposable tip was inflated with 10 cc of normal saline. There was much improved manipulation of the uterus at this point.    Pt was placed back in Trendelenburg positioning.  Midline port site  and right and left lateral abdominal port sites were chosen.  Skin was anesthetized with 0.25% marcaine.  Skin incisions were made.  4mm right lateral port was placed, midline 108mm port was placed, and 12 mm left lateral port was placed.  Pt could only tolerate moderate Trendelenburg positioning due to issues with ventilation.  Good communication  between anesthesia and surgeons was used throughout the case to ensure pt was ventilating well and that surgery was performed safely.    Attention was turned the left side.  The cyst was decompressed by incising it and suctioning out the fluid with the Nazat suction irrigator.  Then the left tube was clamped, cauterized and incised at the fundus.  The tube was then excised off the ovary above the IP ligament.  Then the mesosalpinx was serially clamped, cauterized, and incised free from the ovary.  Excellent hemostasis was noted.  The specimen was placed in the cul de sac.    Attention was turned to the right side. With uterus on stretch the right IP ligament was serially clamped, cauterized and incised with the Gyrus.  Care was taken to ensure the dissection was off the sidewall.  Then the mesosalpinx was clamped, cauterized, and incised.  Finally the right uteroovarian pedicle was serially clamped, cauterized, and incised freeing the right tube and ovary.  While lifting the ovary, a small cyst was inadvertently punctured.  The large cyst, complex cyst was still intact.  The speciment was placed in the cul de sac.    All pedicles were hemostatic.  A bag was then placed in the abdomen and the specimens were placed in it.  The midline port was removed.  The midline fascial incision was extended to all enough exposure to get to the specimen.  The left tube was removed from the bag.  The right ovarian cyst was ruptured in the bag and brought out throught th incision.  The midline incision was closed with #0 vicryl as good visualziation was seen at this point.    Pt was taken out of trendelenberg positioning.  Pneumoperitoneum was relieved after LUQ and R mid quadrant 100mm ports were removed.  Left lateral port was removed.  Fascia was closed at this site with #0 Vicryl.  Finally skin was closed with #4 Vicryl with subcuticular stitch.  Dermabond was applied and dressings applied.    Attention was turned to the  vagina.  Speculum was replaced.  Rumi was removed.  Minimal bleeding from cervix was noted.  Vagina was without any abnormal findings.  Foley was removed.  Cystoscopy was performed.  Intact bladder and normal urine jets were seen from both ureteral orifices.  Cystoscopic fluid was drained.  Cystoscopy was ended.  Sponge, lap, needle, initially counts were correct x2. Patient tolerated the procedure very well. She was awakened from anesthesia, extubated and taken to recovery in stable condition.   COUNTS:  YES  PLAN OF CARE: Transfer to PACU

## 2014-06-07 NOTE — H&P (Signed)
Nicole Khan is an 49 y.o. female [phone] MWF here for bilateral BSO due to 10cm left adnexal mass and 4.4cm right adnexal mass. Pt has seen Dr. Denman George who recommended proceeding.  She has  Pertinent Gynecological History: Menses: irregular bleeding Bleeding: dysfunctional uterine bleeding Contraception: vasectomy DES exposure: denies Blood transfusions: none Sexually transmitted diseases: no past history Previous GYN Procedures: cryo  Last mammogram: normal Date: 04/27/13 Last pap: normal Date: 1/14 ASCUS with neg HR HPV OB History: G4, P1122   Menstrual History: No LMP recorded.    Past Medical History  Diagnosis Date  . Hypertension   . Polycystic ovary disease   . Hypercholesteremia   . Anxiety   . Depression   . Fracture dislocation of right ankle joint 06-04-2013  . Fracture of distal fibula 06-04-2013  . SVD (spontaneous vaginal delivery)     x 1  . PONV (postoperative nausea and vomiting)   . Seasonal allergies   . Diabetes mellitus without complication     type 2  . History of kidney stones     passed stones, no surgery required    Past Surgical History  Procedure Laterality Date  . Carpal tunnel release      bilateral  . Cesarean section      x 1  . Cervical cerclage  07/1991; 05/1999    x 2  . Cholecystectomy, laparoscopic  1996  . Orif ankle fracture Right 06/07/2013    Procedure: OPEN REDUCTION INTERNAL FIXATION (ORIF) ANKLE FRACTURE  FIBULA SYNDESMOSIS;  Surgeon: Ninetta Lights, MD;  Location: [facility];  Service: Orthopedics;  Laterality: Right;    Family History  Problem Relation Age of Onset  . Hypertension Mother   . Infertility Mother   . Deep vein thrombosis Mother   . Anxiety disorder Mother   . Depression Mother   . Hepatitis C Mother     blood transfusion  . Aneurysm Mother     [place]  . Cancer Father     gallbladder, renal  . Hypertension Father   . Anxiety disorder Father   . Depression Father   . Multiple births  Sister   . Cancer Sister     thyroid cancer  . Thyroid disease Sister   . Anxiety disorder Sister   . Depression Sister   . Hypertension Brother   . Anxiety disorder Brother   . Depression Brother     Social History:  reports that she has never smoked. She has never used smokeless tobacco. She reports that she does not drink alcohol or use illicit drugs.  Allergies:  Allergies  Allergen Reactions  . Soy Allergy Anaphylaxis    Soy milk  . Celery Oil Itching    Causes itching in the mouth.  . Eggs Or Egg-Derived Products Nausea Only  . Hazel Tree Pollen [Corylus] Swelling    Hazelnuts, makes patients mouth swell    Prescriptions prior to admission  Medication Sig Dispense Refill Last Dose  . acetaminophen (TYLENOL) 500 MG tablet Take 1,000 mg by mouth every 6 (six) hours as needed for headache.   Taking  . Biotin 10 MG TABS Take 1 tablet by mouth daily.   Taking  . calcium carbonate (TUMS - DOSED IN MG ELEMENTAL CALCIUM) 500 MG chewable tablet Chew 2 tablets by mouth daily as needed for indigestion or heartburn.   Taking  . atorvastatin (LIPITOR) 40 MG tablet Take 1 tablet by mouth at bedtime.   1 Taking  .  buPROPion (WELLBUTRIN XL) 300 MG 24 hr tablet Take 300 mg by mouth daily.   Taking  . cetirizine (ZYRTEC ALLERGY) 10 MG tablet Take 1 tablet (10 mg total) by mouth daily.     . citalopram (CELEXA) 20 MG tablet Take 20 mg by mouth daily.   Taking  . ibuprofen (ADVIL,MOTRIN) 800 MG tablet Take 1 tablet (800 mg total) by mouth every 8 (eight) hours as needed. 30 tablet 0   . metFORMIN (GLUCOPHAGE) 1000 MG tablet Take 1 tablet by mouth 2 (two) times daily.  3 Taking  . metoprolol (LOPRESSOR) 100 MG tablet Take 100 mg by mouth 2 (two) times daily.   Taking  . norethindrone (MICRONOR,CAMILA,ERRIN) 0.35 MG tablet Take 1 tablet (0.35 mg total) by mouth daily. (Patient taking differently: Take 1 tablet by mouth at bedtime. ) 3 Package 3 Taking  . oxyCODONE-acetaminophen (PERCOCET) 5-325  MG per tablet Take 2 tablets by mouth every 4 (four) hours as needed. use only as much as needed to relieve pain 30 tablet 0   . spironolactone (ALDACTONE) 100 MG tablet Take 100 mg by mouth 2 (two) times daily.   Taking    Review of Systems  All other systems reviewed and are negative.   There were no vitals taken for this visit. Physical Exam  Constitutional: She is oriented to person, place, and time. She appears well-developed and well-nourished.  Cardiovascular: Normal rate and regular rhythm.   Respiratory: Effort normal and breath sounds normal.  Neurological: She is alert and oriented to person, place, and time.  Skin: Skin is warm and dry.  Psychiatric: She has a normal mood and affect.    No results found for this or any previous visit (from the past 24 hour(s)).  No results found.  Assessment/Plan: 49 yo [id number] here for possible BSO due to 10cm left adnexal mass and 4.4cm right adnexal mass.  Risks and benefits discussed at prior office visit.  All questions answered today.  She is here with her husband and ready to proceed.  Hale Bogus SUZANNE 06/07/2014, 9:12 AM

## 2014-06-07 NOTE — Addendum Note (Signed)
Addendum  created 06/07/14 1453 by Lyn Hollingshead, MD   Modules edited: Orders, PRL Based Order Sets

## 2014-06-07 NOTE — Transfer of Care (Signed)
Immediate Anesthesia Transfer of Care Note  Patient: Nicole Khan  Procedure(s) Performed: Procedure(s): LAPAROSCOPIC RIGHT SALPINGO OOPHORECTOMY/COLLECTION OF PELVIC WASHINGS (Right) CYSTOSCOPY (N/A) LAPAROSCOPIC UNILATERAL SALPINGECTOMY (Left)  Patient Location: PACU  Anesthesia Type:General  Level of Consciousness: awake, alert  and oriented  Airway & Oxygen Therapy: Patient Spontanous Breathing and Patient connected to face mask oxygen  Post-op Assessment: Report given to RN, Post -op Vital signs reviewed and stable and Patient moving all extremities  Post vital signs: Reviewed and stable  Last Vitals:  Filed Vitals:   06/07/14 0912  BP: 110/57  Pulse: 78  Temp: 36.6 C  Resp: 16    Complications: No apparent anesthesia complications

## 2014-06-07 NOTE — Discharge Instructions (Addendum)
Post-surgical Instructions, Outpatient Surgery  You may expect to feel dizzy, weak, and drowsy for as long as 24 hours after receiving the medicine that made you sleep (anesthetic). For the first 24 hours after your surgery:    Do not drive a car, ride a bicycle, participate in physical activities, or take public transportation until you are done taking narcotic pain medicines or as directed by Dr. Sabra Heck.   Do not drink alcohol or take tranquilizers.   Do not take medicine that has not been prescribed by your physicians.   Do not sign important papers or make important decisions while on narcotic pain medicines.   Have a responsible person with you.   CARE OF INCISION  Do not remove your bandages for three days.  There is a skin superglue called dermabond on them.  Just let this loosen on its own.   You may shower on the first day after your surgery.  Do not sit in a tub bath for one week.  Avoid heavy lifting (more than 10 pounds/4.5 kilograms), pushing, or pulling.   Avoid activities that may risk injury to your incisions.   PAIN MANAGEMENT  Motrin 800mg .  (This is the same as 4-200mg  over the counter tablets of Motrin or ibuprofen.)  You may take this every eight hours or as needed for cramping.    Percocet 5/235.  For more severe pain, take one or two tablets every four to six hours as needed for pain control.  (Remember that narcotic pain medications increase your risk of constipation.  If this becomes a problem, you may take an over the counter stool softener like Colace 100mg  up to four times a day.)  DO'S AND DON'T'S  Do not take a tub bath for one week.  You may shower on the first day after your surgery  Do not do any heavy lifting for one to two weeks.  This increases the chance of bleeding.  Do move around as you feel able.  Stairs are fine.  You may begin to exercise again as you feel able.  Do not lift any weights for two weeks.  Do not put anything in the vagina  for two weeks--no tampons, intercourse, or douching.    REGULAR MEDIATIONS/VITAMINS:  You may restart all of your regular medications as prescribed.  DO not restart any aspirin products for 5 days.  You may restart all of your vitamins as you normally take them.    PLEASE CALL OR SEEK MEDICAL CARE IF:  You have persistent nausea and vomiting.   You have trouble eating or drinking.   You have an oral temperature above 100.5.   You have constipation that is not helped by adjusting diet or increasing fluid intake. Pain medicines are a common cause of constipation.   You have heavy vaginal bleeding  You have redness or drainage from your incision(s) or there is increasing pain or tenderness near or in the surgical site.

## 2014-06-07 NOTE — Anesthesia Postprocedure Evaluation (Signed)
Anesthesia Post Note  Patient: Nicole Khan  Procedure(s) Performed: Procedure(s) (LRB): LAPAROSCOPIC RIGHT SALPINGO OOPHORECTOMY/COLLECTION OF PELVIC WASHINGS (Right) CYSTOSCOPY (N/A) LAPAROSCOPIC UNILATERAL SALPINGECTOMY (Left)  Anesthesia type: General  Patient location: PACU  Post pain: Pain level controlled  Post assessment: Post-op Vital signs reviewed  Last Vitals:  Filed Vitals:   06/07/14 1330  BP: 118/73  Pulse: 83  Temp:   Resp: 21    Post vital signs: Reviewed  Level of consciousness: sedated  Complications: No apparent anesthesia complications

## 2014-06-08 ENCOUNTER — Encounter (HOSPITAL_COMMUNITY): Payer: Self-pay | Admitting: Obstetrics & Gynecology

## 2014-06-20 ENCOUNTER — Telehealth: Payer: Self-pay | Admitting: Obstetrics & Gynecology

## 2014-06-20 ENCOUNTER — Ambulatory Visit: Payer: 59 | Admitting: Obstetrics & Gynecology

## 2014-06-20 NOTE — Telephone Encounter (Signed)
Called and spoke with patient to cancel her 2 week post op for today with Dr. Sabra Heck due to a surgery conflict. I offered the patient 06/27/14 but she will be out of town that day. She says she is "doing well." Please assist with rescheduling due to availability.  Cc: Gay Filler

## 2014-06-20 NOTE — Telephone Encounter (Signed)
Call to patient. Post op appointment rescheduled to 06-22-14 before patient travels for Easter holiday.  Routing to provider for final review. Patient agreeable to disposition. Will close encounter

## 2014-06-22 ENCOUNTER — Ambulatory Visit (INDEPENDENT_AMBULATORY_CARE_PROVIDER_SITE_OTHER): Payer: 59 | Admitting: Obstetrics & Gynecology

## 2014-06-22 VITALS — BP 114/70 | HR 64 | Resp 16 | Wt 195.2 lb

## 2014-06-22 DIAGNOSIS — D270 Benign neoplasm of right ovary: Secondary | ICD-10-CM

## 2014-06-30 ENCOUNTER — Encounter: Payer: Self-pay | Admitting: Obstetrics & Gynecology

## 2014-06-30 NOTE — Progress Notes (Signed)
Post Operative Visit  Procedure:  Laparoscopic RSO, left salpingectomy, pelvic washings, cystoscopy Days Post-op: 15 days  Subjective: Pt reports she is doing really well.  No pain.  Normal bladder and bowel function.  Going out of town today--flying to the [employer].  Very excited.  Minimal vaginal bleeding after procedure but none since.  Denies hot flashes.  Feels great.  Reviewed pictures and pathology report.  All questions answered.  Objective: BP 114/70 mmHg  Pulse 64  Resp 16  Wt 195 lb 3.2 oz (88.542 kg)  LMP 05/23/2014  EXAM General: alert and cooperative Resp: clear to auscultation bilaterally Cardio: regular rate and rhythm, S1, S2 normal, no murmur, click, rub or gallop GI: soft, non-tender; bowel sounds normal; no masses,  no organomegaly and incision: clean, dry and intact Extremities: extremities normal, atraumatic, no cyanosis or edema Vaginal Bleeding: none  Assessment: s/p laparoscopic RSO, left salpingectomy, pelvic washings, cystoscopy due to ovarian cystadenomas  Plan: Follow up PRN and for routine AEX.

## 2015-04-13 MED FILL — SPIRONOLACTONE 100 MG TAB: 100 | 30 days supply | Qty: 60 | Fill #1

## 2015-04-18 ENCOUNTER — Ambulatory Visit (INDEPENDENT_AMBULATORY_CARE_PROVIDER_SITE_OTHER): Payer: 59 | Admitting: Nurse Practitioner

## 2015-04-18 ENCOUNTER — Other Ambulatory Visit: Payer: Self-pay

## 2015-04-18 ENCOUNTER — Encounter: Payer: Self-pay | Admitting: Nurse Practitioner

## 2015-04-18 VITALS — BP 136/78 | HR 76 | Ht 62.0 in | Wt 200.0 lb

## 2015-04-18 DIAGNOSIS — Z0000 Encounter for general adult medical examination without abnormal findings: Secondary | ICD-10-CM

## 2015-04-18 DIAGNOSIS — Z01419 Encounter for gynecological examination (general) (routine) without abnormal findings: Secondary | ICD-10-CM | POA: Diagnosis not present

## 2015-04-18 DIAGNOSIS — D271 Benign neoplasm of left ovary: Secondary | ICD-10-CM

## 2015-04-18 DIAGNOSIS — Z1231 Encounter for screening mammogram for malignant neoplasm of breast: Secondary | ICD-10-CM

## 2015-04-18 LAB — POCT URINALYSIS DIPSTICK
Bilirubin, UA: NEGATIVE
Blood, UA: NEGATIVE
Glucose, UA: NEGATIVE
Ketones, UA: NEGATIVE
Leukocytes, UA: NEGATIVE
Nitrite, UA: NEGATIVE
Protein, UA: NEGATIVE
Urobilinogen, UA: NEGATIVE
pH, UA: 5

## 2015-04-18 MED ORDER — SPIRONOLACTONE 100 MG PO TABS: 100.0000 mg | ORAL_TABLET | Freq: Two times a day (BID) | ORAL | Status: DC

## 2015-04-18 MED ORDER — NORETHINDRONE 0.35 MG PO TABS: 1.0000 | ORAL_TABLET | Freq: Every day | ORAL | Status: DC

## 2015-04-18 NOTE — Progress Notes (Signed)
Patient ID: Nicole Khan, female   DOB: December 28, 1965, 50 y.o.   MRN: [id number]  50 y.o. [id number] Married  Caucasian Fe here for annual exam. She has undergone  Laparoscopic RSO/ left salpingectomy, collection of pelvic washings and cysto on 06/07/2014. This was due to bilateral large ovarian cysts noted on pelvic ultrasound obtained 05/05/14. Ca-125 was 25. Pathology was benign. Left ovary remains.  Some vaso symptoms, currently night sweats are some better.      Patient's last menstrual period was 04/04/2014 (exact date).          Sexually active: Yes.    The current method of family planning is POP     Exercising: No.  The patient does not participate in regular exercise at present. Smoker:  no  Health Maintenance: Pap: 04/14/14, Negative with neg HR HPV MMG: 04/27/13, 3D, Bi-Rads 1: Negative TDaP:  ?? Will check with PCP Pneumonia: Not indicated due to age HIV completed 06/07/14 Labs: Dr. [name] and endocrinology  Urine: Negative   reports that she has never smoked. She has never used smokeless tobacco. She reports that she does not drink alcohol or use illicit drugs.  Past Medical History  Diagnosis Date  . Hypertension   . Polycystic ovary disease   . Hypercholesteremia   . Anxiety   . Depression   . Fracture dislocation of right ankle joint 06-04-2013  . Fracture of distal fibula 06-04-2013  . SVD (spontaneous vaginal delivery)     x 1  . PONV (postoperative nausea and vomiting)   . Seasonal allergies   . Diabetes mellitus without complication ([place])     type 2  . History of kidney stones     passed stones, no surgery required    Past Surgical History  Procedure Laterality Date  . Carpal tunnel release      bilateral  . Cesarean section      x 1  . Cervical cerclage  07/1991; 05/1999    x 2  . Cholecystectomy, laparoscopic  1996  . Orif ankle fracture Right 06/07/2013    Procedure: OPEN REDUCTION INTERNAL FIXATION (ORIF) ANKLE FRACTURE  FIBULA SYNDESMOSIS;  Surgeon:  Ninetta Lights, MD;  Location: [facility];  Service: Orthopedics;  Laterality: Right;  . Laparoscopic bilateral salpingo oopherectomy Right 06/07/2014    Procedure: LAPAROSCOPIC RIGHT SALPINGO OOPHORECTOMY/COLLECTION OF PELVIC WASHINGS;  Surgeon: Megan Salon, MD;  Location: [facility];  Service: Gynecology;  Laterality: Right;  . Cystoscopy N/A 06/07/2014    Procedure: CYSTOSCOPY;  Surgeon: Megan Salon, MD;  Location: [facility];  Service: Gynecology;  Laterality: N/A;  . Laparoscopic unilateral salpingectomy Left 06/07/2014    Procedure: LAPAROSCOPIC UNILATERAL SALPINGECTOMY;  Surgeon: Megan Salon, MD;  Location: [facility];  Service: Gynecology;  Laterality: Left;    Current Outpatient Prescriptions  Medication Sig Dispense Refill  . acetaminophen (TYLENOL) 500 MG tablet Take 1,000 mg by mouth every 6 (six) hours as needed for headache.    Marland Kitchen atorvastatin (LIPITOR) 40 MG tablet Take 1 tablet by mouth at bedtime.   1  . buPROPion (WELLBUTRIN XL) 300 MG 24 hr tablet Take 300 mg by mouth daily.    . calcium carbonate (TUMS - DOSED IN MG ELEMENTAL CALCIUM) 500 MG chewable tablet Chew 2 tablets by mouth daily as needed for indigestion or heartburn.    . cetirizine (ZYRTEC ALLERGY) 10 MG tablet Take 1 tablet (10 mg total) by mouth daily.    . citalopram (CELEXA) 20  MG tablet Take 20 mg by mouth daily.    Marland Kitchen ibuprofen (ADVIL,MOTRIN) 800 MG tablet Take 1 tablet (800 mg total) by mouth every 8 (eight) hours as needed. 30 tablet 0  . metFORMIN (GLUCOPHAGE) 1000 MG tablet Take 1 tablet by mouth 2 (two) times daily.  3  . metoprolol (LOPRESSOR) 100 MG tablet Take 100 mg by mouth 2 (two) times daily.    . Multiple Vitamin (MULTIVITAMIN) tablet Take 1 tablet by mouth daily.    . Multiple Vitamins-Minerals (HAIR VITAMINS PO) Take 1 tablet by mouth daily.    . norethindrone (MICRONOR,CAMILA,ERRIN) 0.35 MG tablet Take 1 tablet (0.35 mg total) by mouth daily. 3 Package 0  . spironolactone (ALDACTONE) 100 MG  tablet Take 1 tablet (100 mg total) by mouth 2 (two) times daily. 180 tablet 3   No current facility-administered medications for this visit.    Family History  Problem Relation Age of Onset  . Hypertension Mother   . Infertility Mother   . Deep vein thrombosis Mother   . Anxiety disorder Mother   . Depression Mother   . Hepatitis C Mother     blood transfusion  . Aneurysm Mother     [place]  . Cancer Father     gallbladder, renal  . Hypertension Father   . Anxiety disorder Father   . Depression Father   . Multiple births Sister   . Cancer Sister     thyroid cancer  . Thyroid disease Sister   . Anxiety disorder Sister   . Depression Sister   . Hypertension Brother   . Anxiety disorder Brother   . Depression Brother     ROS:  Pertinent items are noted in HPI.  Otherwise, a comprehensive ROS was negative.  Exam:   BP 136/78 mmHg  Pulse 76  Ht 5\' 2"  (1.575 m)  Wt 200 lb (90.719 kg)  BMI 36.57 kg/m2  LMP 04/04/2014 (Exact Date) Height: 5\' 2"  (157.5 cm) Ht Readings from Last 3 Encounters:  04/18/15 5\' 2"  (1.575 m)  06/02/14 5\' 2"  (1.575 m)  05/05/14 5\' 2"  (1.575 m)    General appearance: alert, cooperative and appears stated age Head: Normocephalic, without obvious abnormality, atraumatic Neck: no adenopathy, supple, symmetrical, trachea midline and thyroid normal to inspection and palpation Lungs: clear to auscultation bilaterally Breasts: normal appearance, no masses or tenderness Heart: regular rate and rhythm Abdomen: soft, non-tender; no masses,  no organomegaly Extremities: extremities normal, atraumatic, no cyanosis or edema Skin: Skin color, texture, turgor normal. No rashes or lesions Lymph nodes: Cervical, supraclavicular, and axillary nodes normal. No abnormal inguinal nodes palpated Neurologic: Grossly normal   Pelvic: External genitalia:  no lesions              Urethra:  normal appearing urethra with no masses, tenderness or lesions               Bartholin's and Skene's: normal                 Vagina: normal appearing vagina with normal color and discharge, no lesions              Cervix: anteverted              Pap taken: No. Bimanual Exam:  Uterus:  normal size, contour, position, consistency, mobility, non-tender              Adnexa: no mass, fullness, tenderness  Rectovaginal: Confirms               Anus:  normal sphincter tone, no lesions  Chaperone present: yes  A:  Well Woman with normal exam  History of PCOS  History of disordered proliferative endometrium 04/2010 on POP  S/P RSO with bilateral salpingectomy secondary to OV cystadenoma 06/07/2014 History of situational stressors - doing well on med's History of DM, renal calculi, HTN  History of abnormal pap with ASCUS neg HR HPV 2014 and normal in 2015   P:   Reviewed health and wellness pertinent to exam  Pap smear as above  Mammogram is due and will schedule  She is given a refill on POP only # 3 until she gets Mammogram  She will be scheduled for a PUS per Dr. Sabra Heck - she is called back and left a detailed mesage per DPR release.  Counseled on breast self exam, mammography screening, use and side effects of HRT, adequate intake of calcium and vitamin D, diet and exercise, Kegel's exercises return annually or prn  An After Visit Summary was printed and given to the patient.

## 2015-04-18 NOTE — Patient Instructions (Addendum)

## 2015-04-19 ENCOUNTER — Telehealth: Payer: Self-pay | Admitting: Obstetrics and Gynecology

## 2015-04-19 DIAGNOSIS — D271 Benign neoplasm of left ovary: Secondary | ICD-10-CM

## 2015-04-19 NOTE — Telephone Encounter (Signed)
Spoke with pt regarding benefit for ultrasound. Patient understood and agreeable. Patient ready to schedule. Patient scheduled 04/20/15 with Quincy Simmonds. Pt aware of arrival date and time. Pt aware of 72 hours cancellation policy with [id number] fee. No further questions. Ok to close

## 2015-04-20 ENCOUNTER — Ambulatory Visit (INDEPENDENT_AMBULATORY_CARE_PROVIDER_SITE_OTHER): Payer: 59

## 2015-04-20 ENCOUNTER — Ambulatory Visit (INDEPENDENT_AMBULATORY_CARE_PROVIDER_SITE_OTHER): Payer: 59 | Admitting: Obstetrics and Gynecology

## 2015-04-20 ENCOUNTER — Encounter: Payer: Self-pay | Admitting: Obstetrics and Gynecology

## 2015-04-20 VITALS — BP 122/84 | HR 70 | Ht 62.0 in | Wt 200.0 lb

## 2015-04-20 DIAGNOSIS — D271 Benign neoplasm of left ovary: Secondary | ICD-10-CM

## 2015-04-20 DIAGNOSIS — N838 Other noninflammatory disorders of ovary, fallopian tube and broad ligament: Secondary | ICD-10-CM

## 2015-04-20 DIAGNOSIS — N83209 Unspecified ovarian cyst, unspecified side: Secondary | ICD-10-CM | POA: Diagnosis not present

## 2015-04-20 DIAGNOSIS — N83202 Unspecified ovarian cyst, left side: Secondary | ICD-10-CM | POA: Diagnosis not present

## 2015-04-20 DIAGNOSIS — N839 Noninflammatory disorder of ovary, fallopian tube and broad ligament, unspecified: Secondary | ICD-10-CM

## 2015-04-20 NOTE — Addendum Note (Signed)
Addended by: Michele Mcalpine on: 04/20/2015 08:09 AM   Modules accepted: Orders

## 2015-04-20 NOTE — Progress Notes (Signed)
  Subjective  50 y.o. [id number]  Caucasian female here for pelvic ultrasound for recheck due to history of bilateral ovarian cysts.  Status post laparoscopic right salpingo-oophorectomy with left salpingectomy and drainage of left paratubal cyst in March 2016.  Final pathology report showed bilateral serous cystadenomas. Right ovarian cyst was large at about 10 cm.  Patient currently on Micronor and has no menstrual problems.   Objective  Pelvic ultrasound images and report reviewed with patient.  Uterus - normal.  EMS - 3.91 m. Ovaries - left ovary with 43 x 39 mm simple cyst and smaller ovarian follicles.  Possible right ovarian remnant.  2.1 x 1.4 x .0.8 Free fluid - no      Assessment  Status post laparoscopic right salpingo-oophorectomy, left salpingectomy, and drainage of left paratubal cyst.  Final pathology showing bilateral benign cystadenomas. Left ovarian cyst today.  Possible right ovarian remnant.   Plan  Discussion of possible ovarian remnant and left serous cystadenoma.  Will check CA125.  Plan for redoing ultrasound in about 6 weeks.  Discussed possible robotic laparoscopic excision of ovarian remnant and left ovary, depending on CA125 and follow up pelvic ultrasound recheck. Indicates understanding.   __25_____ minutes face to face time of which over 50% was spent in counseling.   After visit summary to patient.

## 2015-04-21 LAB — CA 125: CA 125: 14 U/mL (ref <35)

## 2015-04-24 NOTE — Progress Notes (Signed)
Reviewed personally.  M. Suzanne Yeilin Zweber, MD.  

## 2015-04-25 ENCOUNTER — Telehealth: Payer: Self-pay | Admitting: Emergency Medicine

## 2015-04-25 NOTE — Telephone Encounter (Signed)
Patient returned call. She is given message from Dr. Quincy Simmonds.  Agreeable to plan.  Scheduled for Pelvic ultrasound 06/08/15 at 1100.  cc Nicole Khan for insurance pre-certification and patient contact.

## 2015-04-25 NOTE — Telephone Encounter (Signed)
-----   Message from Nunzio Cobbs, MD sent at 04/21/2015  1:45 PM EST ----- Please report normal CA125 to patient.  I am recommending the follow up ultrasound in about 6 weeks. This has been sent to precert.   Cc- Marisa Sprinkles

## 2015-04-25 NOTE — Telephone Encounter (Signed)
Message left to return call to Nicole Khan at [phone].    

## 2015-05-09 ENCOUNTER — Ambulatory Visit
Admission: RE | Admit: 2015-05-09 | Discharge: 2015-05-09 | Disposition: A | Discharge status: Home / Self Care | Payer: 59 | Source: Ambulatory Visit

## 2015-05-09 DIAGNOSIS — Z1231 Encounter for screening mammogram for malignant neoplasm of breast: Secondary | ICD-10-CM | POA: Diagnosis not present

## 2015-05-09 MED FILL — ATORVASTATIN 40 MG TABLET: 40 | 90 days supply | Qty: 90 | Fill #1

## 2015-05-09 MED FILL — NORETHINDRONE 0.35 MG TAB: 0.35 | 84 days supply | Qty: 84 | Fill #0

## 2015-05-10 MED FILL — BUPROPION HCL XL 300 MG TAB: 300 | 90 days supply | Qty: 90 | Fill #0

## 2015-05-10 MED FILL — METOPROLOL TARTRATE 100 MG: 100 | 90 days supply | Qty: 180 | Fill #0

## 2015-05-22 DIAGNOSIS — Z7984 Long term (current) use of oral hypoglycemic drugs: Secondary | ICD-10-CM | POA: Diagnosis not present

## 2015-05-22 DIAGNOSIS — E119 Type 2 diabetes mellitus without complications: Secondary | ICD-10-CM | POA: Diagnosis not present

## 2015-05-22 MED FILL — SPIRONOLACTONE 100 MG TAB: 100 | 90 days supply | Qty: 180 | Fill #0

## 2015-05-24 DIAGNOSIS — E119 Type 2 diabetes mellitus without complications: Secondary | ICD-10-CM | POA: Diagnosis not present

## 2015-05-24 DIAGNOSIS — Z7984 Long term (current) use of oral hypoglycemic drugs: Secondary | ICD-10-CM | POA: Diagnosis not present

## 2015-05-24 DIAGNOSIS — Z5181 Encounter for therapeutic drug level monitoring: Secondary | ICD-10-CM | POA: Diagnosis not present

## 2015-05-24 DIAGNOSIS — R748 Abnormal levels of other serum enzymes: Secondary | ICD-10-CM | POA: Diagnosis not present

## 2015-05-24 DIAGNOSIS — E282 Polycystic ovarian syndrome: Secondary | ICD-10-CM | POA: Diagnosis not present

## 2015-05-24 DIAGNOSIS — Z87442 Personal history of urinary calculi: Secondary | ICD-10-CM | POA: Diagnosis not present

## 2015-05-31 DIAGNOSIS — Z9079 Acquired absence of other genital organ(s): Secondary | ICD-10-CM

## 2015-05-31 HISTORY — DX: Acquired absence of other genital organ(s): Z90.79

## 2015-06-05 MED FILL — metFORMIN HCL 1000 MG TABS: 1000 | 90 days supply | Qty: 180 | Fill #0

## 2015-06-07 MED FILL — CITALOPRAM HBR 20 MG TABLET: 20 | 90 days supply | Qty: 90 | Fill #0

## 2015-06-08 ENCOUNTER — Ambulatory Visit (INDEPENDENT_AMBULATORY_CARE_PROVIDER_SITE_OTHER): Payer: 59

## 2015-06-08 ENCOUNTER — Encounter: Payer: Self-pay | Admitting: Obstetrics and Gynecology

## 2015-06-08 ENCOUNTER — Ambulatory Visit (INDEPENDENT_AMBULATORY_CARE_PROVIDER_SITE_OTHER): Payer: 59 | Admitting: Obstetrics and Gynecology

## 2015-06-08 VITALS — BP 120/68 | HR 66 | Ht 62.0 in | Wt 199.0 lb

## 2015-06-08 DIAGNOSIS — N83299 Other ovarian cyst, unspecified side: Secondary | ICD-10-CM | POA: Diagnosis not present

## 2015-06-08 DIAGNOSIS — N83202 Unspecified ovarian cyst, left side: Secondary | ICD-10-CM | POA: Diagnosis not present

## 2015-06-08 NOTE — Progress Notes (Signed)
Subjective  50 y.o. [id number] Married Caucasian female here for pelvic ultrasound for follow up of left ovary cyst and possible right ovarian remnant.  Husband present for the discussion today.  No LMP recorded. Patient is not currently having periods (Reason: Oral contraceptives).    Status post laparoscopic right salpingo-oophorectomy with left salpingectomy and drainage of left paratubal cyst in March 2016.  Final pathology report showed bilateral serous cystadenomas. Right ovarian cyst was large at about 10 cm.  Pelvic ultrasound 04/20/15 ordered due to hx of bilateral ovarian cysts. Uterus - normal.  EMS - 3.91 m. Ovaries - left ovary with 43 x 39 mm simple cyst and smaller ovarian follicles. Possible right ovarian remnant. 2.1 x 1.4 x .0.8 Free fluid - no  CA125 - 14 on 04/20/15.  Patient currently on Micronor and has no menstrual problems.  Asking about PCOS.  Objective  Pelvic ultrasound images and report reviewed with patient.  Uterus - no masses. EMS - 3.38 mm. Ovaries - right adnexa - no mass.  Bowel noted to have peristalsis.  Left ovary with 31 x 25 mm thin walled cyst.  Decreased from 43 x 39 mm. Free fluid - no    Assessment  No evidence of right ovarian remnant.  Decreased size of left ovarian cyst.  Doubt cystadenoma. No characteristic pattern of PCOS on ultrasound.  Plan  Discussion of ovarian cysts.  No surgical intervention recommended. No obligatory follow up pelvic ultrasound needed due to diminished size of ovarian cyst.  Discussion of hormonal changes with PCOS. Continue Micronor.  Follow up for annual exam and prn.   ___15____ minutes face to face time of which over 50% was spent in counseling.   After visit summary to patient.

## 2015-06-28 DIAGNOSIS — Z6836 Body mass index (BMI) 36.0-36.9, adult: Secondary | ICD-10-CM | POA: Diagnosis not present

## 2015-06-28 DIAGNOSIS — R0609 Other forms of dyspnea: Secondary | ICD-10-CM | POA: Diagnosis not present

## 2015-06-28 DIAGNOSIS — E669 Obesity, unspecified: Secondary | ICD-10-CM | POA: Diagnosis not present

## 2015-06-28 DIAGNOSIS — R0789 Other chest pain: Secondary | ICD-10-CM | POA: Diagnosis not present

## 2015-06-28 DIAGNOSIS — F329 Major depressive disorder, single episode, unspecified: Secondary | ICD-10-CM | POA: Diagnosis not present

## 2015-06-29 ENCOUNTER — Other Ambulatory Visit (HOSPITAL_COMMUNITY): Payer: Self-pay | Admitting: Respiratory Therapy

## 2015-06-29 DIAGNOSIS — R0789 Other chest pain: Secondary | ICD-10-CM

## 2015-07-04 ENCOUNTER — Ambulatory Visit (HOSPITAL_COMMUNITY)
Admission: RE | Admit: 2015-07-04 | Discharge: 2015-07-04 | Disposition: A | Discharge status: Home / Self Care | Payer: 59 | Source: Ambulatory Visit | Attending: Family Medicine | Admitting: Family Medicine

## 2015-07-04 DIAGNOSIS — R0789 Other chest pain: Secondary | ICD-10-CM | POA: Diagnosis not present

## 2015-07-04 DIAGNOSIS — R0600 Dyspnea, unspecified: Secondary | ICD-10-CM | POA: Diagnosis not present

## 2015-07-04 LAB — PULMONARY FUNCTION TEST
DL/VA % pred: 110 %
DL/VA: 5.04 ml/min/mmHg/L
DLCO unc % pred: 95 %
DLCO unc: 20.68 ml/min/mmHg
FEF 25-75 Post: 1.89 L/sec
FEF 25-75 Pre: 2.56 L/sec
FEF2575-%Change-Post: -26 %
FEF2575-%Pred-Post: 69 %
FEF2575-%Pred-Pre: 94 %
FEV1-%Change-Post: -8 %
FEV1-%Pred-Post: 79 %
FEV1-%Pred-Pre: 86 %
FEV1-Post: 2.11 L
FEV1-Pre: 2.3 L
FEV1FVC-%Change-Post: -7 %
FEV1FVC-%Pred-Pre: 104 %
FEV6-%Change-Post: 0 %
FEV6-%Pred-Post: 83 %
FEV6-%Pred-Pre: 83 %
FEV6-Post: 2.7 L
FEV6-Pre: 2.72 L
FEV6FVC-%Pred-Post: 102 %
FEV6FVC-%Pred-Pre: 102 %
FVC-%Change-Post: 0 %
FVC-%Pred-Post: 81 %
FVC-%Pred-Pre: 81 %
FVC-Post: 2.7 L
FVC-Pre: 2.72 L
Post FEV1/FVC ratio: 78 %
Post FEV6/FVC ratio: 100 %
Pre FEV1/FVC ratio: 85 %
Pre FEV6/FVC Ratio: 100 %
RV % pred: 82 %
RV: 1.37 L
TLC % pred: 90 %
TLC: 4.3 L

## 2015-07-04 MED ORDER — ALBUTEROL SULFATE (2.5 MG/3ML) 0.083% IN NEBU
2.5000 mg | INHALATION_SOLUTION | Freq: Once | RESPIRATORY_TRACT | Status: AC
  Administered 2015-07-04: 2.5 mg via RESPIRATORY_TRACT

## 2015-08-07 MED FILL — METOPROLOL TARTRATE 100 MG: 100 | 90 days supply | Qty: 180 | Fill #0

## 2015-08-07 MED FILL — NORETHINDRONE 0.35 MG TAB: 0.35 | 84 days supply | Qty: 84 | Fill #1

## 2015-08-11 MED FILL — ATORVASTATIN 40 MG TABLET: 40 | 90 days supply | Qty: 90 | Fill #0

## 2015-08-14 MED FILL — BUPROPION HCL XL 300 MG TAB: 300 | 90 days supply | Qty: 90 | Fill #0

## 2015-08-25 DIAGNOSIS — R748 Abnormal levels of other serum enzymes: Secondary | ICD-10-CM | POA: Diagnosis not present

## 2015-08-25 DIAGNOSIS — E119 Type 2 diabetes mellitus without complications: Secondary | ICD-10-CM | POA: Diagnosis not present

## 2015-08-25 DIAGNOSIS — Z5181 Encounter for therapeutic drug level monitoring: Secondary | ICD-10-CM | POA: Diagnosis not present

## 2015-08-25 DIAGNOSIS — Z7984 Long term (current) use of oral hypoglycemic drugs: Secondary | ICD-10-CM | POA: Diagnosis not present

## 2015-08-29 DIAGNOSIS — Z7984 Long term (current) use of oral hypoglycemic drugs: Secondary | ICD-10-CM | POA: Diagnosis not present

## 2015-08-29 DIAGNOSIS — E119 Type 2 diabetes mellitus without complications: Secondary | ICD-10-CM | POA: Diagnosis not present

## 2015-08-29 DIAGNOSIS — R197 Diarrhea, unspecified: Secondary | ICD-10-CM | POA: Diagnosis not present

## 2015-08-29 DIAGNOSIS — Z5181 Encounter for therapeutic drug level monitoring: Secondary | ICD-10-CM | POA: Diagnosis not present

## 2015-08-29 DIAGNOSIS — E282 Polycystic ovarian syndrome: Secondary | ICD-10-CM | POA: Diagnosis not present

## 2015-08-29 DIAGNOSIS — Z87442 Personal history of urinary calculi: Secondary | ICD-10-CM | POA: Diagnosis not present

## 2015-08-29 MED FILL — METFORMIN HCL ER 500 MG TAB: 500 | 90 days supply | Qty: 360 | Fill #0

## 2015-08-30 MED FILL — SPIRONOLACTONE 100 MG TAB: 100 | 90 days supply | Qty: 180 | Fill #1

## 2015-09-20 MED FILL — CITALOPRAM HBR 20 MG TABLET: 20 | 90 days supply | Qty: 90 | Fill #0

## 2015-11-09 MED FILL — METOPROLOL TARTRATE 100 MG: 100 | 90 days supply | Qty: 180 | Fill #0

## 2015-11-09 MED FILL — HEATHER TABLET: 0.35 | 84 days supply | Qty: 84 | Fill #2

## 2015-11-09 MED FILL — ATORVASTATIN 40 MG TABLET: 40 | 90 days supply | Qty: 90 | Fill #1

## 2015-11-09 MED FILL — BUPROPION HCL XL 300 MG TAB: 300 | 90 days supply | Qty: 90 | Fill #0

## 2015-11-14 ENCOUNTER — Ambulatory Visit: Payer: 59 | Admitting: Adult Health

## 2015-12-01 DIAGNOSIS — Z87442 Personal history of urinary calculi: Secondary | ICD-10-CM | POA: Diagnosis not present

## 2015-12-01 DIAGNOSIS — E1165 Type 2 diabetes mellitus with hyperglycemia: Secondary | ICD-10-CM | POA: Diagnosis not present

## 2015-12-01 DIAGNOSIS — Z7984 Long term (current) use of oral hypoglycemic drugs: Secondary | ICD-10-CM | POA: Diagnosis not present

## 2015-12-01 DIAGNOSIS — E119 Type 2 diabetes mellitus without complications: Secondary | ICD-10-CM | POA: Diagnosis not present

## 2015-12-01 DIAGNOSIS — E282 Polycystic ovarian syndrome: Secondary | ICD-10-CM | POA: Diagnosis not present

## 2015-12-01 DIAGNOSIS — Z5181 Encounter for therapeutic drug level monitoring: Secondary | ICD-10-CM | POA: Diagnosis not present

## 2015-12-01 DIAGNOSIS — Z8249 Family history of ischemic heart disease and other diseases of the circulatory system: Secondary | ICD-10-CM | POA: Diagnosis not present

## 2015-12-06 MED FILL — spIRONOLACTONE 100 MG TAB: 100 | 90 days supply | Qty: 180 | Fill #2

## 2015-12-06 MED FILL — SYNJARDY XR 5-1,000 MG TAB: 5-1000 | 90 days supply | Qty: 180 | Fill #0

## 2015-12-29 DIAGNOSIS — Z6835 Body mass index (BMI) 35.0-35.9, adult: Secondary | ICD-10-CM | POA: Diagnosis not present

## 2015-12-29 DIAGNOSIS — F329 Major depressive disorder, single episode, unspecified: Secondary | ICD-10-CM | POA: Diagnosis not present

## 2015-12-29 DIAGNOSIS — I10 Essential (primary) hypertension: Secondary | ICD-10-CM | POA: Diagnosis not present

## 2015-12-29 DIAGNOSIS — E669 Obesity, unspecified: Secondary | ICD-10-CM | POA: Diagnosis not present

## 2015-12-29 DIAGNOSIS — Z23 Encounter for immunization: Secondary | ICD-10-CM | POA: Diagnosis not present

## 2016-01-09 MED FILL — CITALOPRAM HBR 20 MG TABLET: 20 | 90 days supply | Qty: 90 | Fill #0

## 2016-01-22 DIAGNOSIS — H524 Presbyopia: Secondary | ICD-10-CM | POA: Diagnosis not present

## 2016-01-22 DIAGNOSIS — H35461 Secondary vitreoretinal degeneration, right eye: Secondary | ICD-10-CM | POA: Diagnosis not present

## 2016-01-22 DIAGNOSIS — H5213 Myopia, bilateral: Secondary | ICD-10-CM | POA: Diagnosis not present

## 2016-02-06 MED FILL — HEATHER TABLET: 0.35 | 84 days supply | Qty: 84 | Fill #3

## 2016-02-06 MED FILL — ATORVASTATIN 40 MG TABLET: 40 | 90 days supply | Qty: 90 | Fill #2

## 2016-02-19 MED FILL — METOPROLOL TARTRATE 100 MG: 100 | 90 days supply | Qty: 180 | Fill #0

## 2016-02-19 MED FILL — BUPROPION HCL XL 300 MG TAB: 300 | 90 days supply | Qty: 90 | Fill #0

## 2016-03-05 MED FILL — spIRONOLACTONE 100 MG TAB: 100 | 90 days supply | Qty: 180 | Fill #3

## 2016-03-05 MED FILL — SYNJARDY XR 5-1,000 MG TAB: 5-1000 | 90 days supply | Qty: 180 | Fill #1

## 2016-03-15 DIAGNOSIS — Z8249 Family history of ischemic heart disease and other diseases of the circulatory system: Secondary | ICD-10-CM | POA: Diagnosis not present

## 2016-03-15 DIAGNOSIS — E282 Polycystic ovarian syndrome: Secondary | ICD-10-CM | POA: Diagnosis not present

## 2016-03-15 DIAGNOSIS — Z87442 Personal history of urinary calculi: Secondary | ICD-10-CM | POA: Diagnosis not present

## 2016-03-15 DIAGNOSIS — E119 Type 2 diabetes mellitus without complications: Secondary | ICD-10-CM | POA: Diagnosis not present

## 2016-03-15 DIAGNOSIS — Z5181 Encounter for therapeutic drug level monitoring: Secondary | ICD-10-CM | POA: Diagnosis not present

## 2016-03-21 DIAGNOSIS — Z5181 Encounter for therapeutic drug level monitoring: Secondary | ICD-10-CM | POA: Diagnosis not present

## 2016-03-21 DIAGNOSIS — E119 Type 2 diabetes mellitus without complications: Secondary | ICD-10-CM | POA: Diagnosis not present

## 2016-03-21 DIAGNOSIS — Z794 Long term (current) use of insulin: Secondary | ICD-10-CM | POA: Diagnosis not present

## 2016-04-18 MED FILL — CITALOPRAM HBR 20 MG TABLET: 20 | 90 days supply | Qty: 90 | Fill #1

## 2016-04-22 ENCOUNTER — Encounter: Payer: Self-pay | Admitting: Nurse Practitioner

## 2016-04-22 ENCOUNTER — Ambulatory Visit (INDEPENDENT_AMBULATORY_CARE_PROVIDER_SITE_OTHER): Payer: 59 | Admitting: Nurse Practitioner

## 2016-04-22 VITALS — BP 100/66 | HR 68 | Ht 61.75 in | Wt 191.0 lb

## 2016-04-22 DIAGNOSIS — Z01411 Encounter for gynecological examination (general) (routine) with abnormal findings: Secondary | ICD-10-CM | POA: Diagnosis not present

## 2016-04-22 DIAGNOSIS — N949 Unspecified condition associated with female genital organs and menstrual cycle: Secondary | ICD-10-CM | POA: Diagnosis not present

## 2016-04-22 DIAGNOSIS — N859 Noninflammatory disorder of uterus, unspecified: Secondary | ICD-10-CM

## 2016-04-22 DIAGNOSIS — Z0000 Encounter for general adult medical examination without abnormal findings: Secondary | ICD-10-CM

## 2016-04-22 DIAGNOSIS — E282 Polycystic ovarian syndrome: Secondary | ICD-10-CM

## 2016-04-22 MED ORDER — FLUCONAZOLE 150 MG PO TABS: ORAL_TABLET | ORAL | 1 refills | Status: DC

## 2016-04-22 MED ORDER — SPIRONOLACTONE 100 MG PO TABS: 100.0000 mg | ORAL_TABLET | Freq: Two times a day (BID) | ORAL | 4 refills | Status: DC

## 2016-04-22 MED ORDER — NORETHINDRONE 0.35 MG PO TABS: 1.0000 | ORAL_TABLET | Freq: Every day | ORAL | 4 refills | Status: DC

## 2016-04-22 MED FILL — HEATHER TABLET: 0.35 | 84 days supply | Qty: 84 | Fill #0

## 2016-04-22 MED FILL — FLUCONAZOLE 150 MG TABLET: 150 | 28 days supply | Qty: 4 | Fill #0

## 2016-04-22 NOTE — Patient Instructions (Signed)

## 2016-04-22 NOTE — Progress Notes (Signed)
Patient ID: Nicole Khan, female   DOB: 06/12/1965, 51 y.o.   MRN: [id number]  51 y.o. [id number] Married  Caucasian Fe here for annual exam.  (She has undergone  Laparoscopic RSO/ left salpingectomy, collection of pelvic washings and cysto on 06/07/2014. This was due to bilateral large ovarian cysts noted on pelvic ultrasound obtained 05/05/14. Ca-125 was 25. Pathology was benign. Left ovary remains.)  Ran out of POP in September and had a withdrawal bleed in October.  Since back POP no menses.  Since Synjardy for diabetes, there is a risk of increased yeast.  Some vaginal irritation and has used OTC antifungal.    Patient's last menstrual period was 12/31/2015 (within weeks).          Sexually active: Yes.    The current method of family planning is oral progesterone-only contraceptive.    Exercising: No.  The patient does not participate in regular exercise at present. Smoker:  no  Health Maintenance: Pap: 04/14/14, Negative with neg HR HPV (previous ASCUS with negative HR HPV 2014 and normal 2015/ 2016) MMG: 05/09/15, 3D, Bi-Rads 1: Negative TDaP:  ?? Will get records and may be due now Pneumonia: Never (diabetes) HIV: 06/07/14 Labs: PCP and endocrinology following all labs   reports that she has never smoked. She has never used smokeless tobacco. She reports that she does not drink alcohol or use drugs.  Past Medical History:  Diagnosis Date  . Anxiety   . Depression   . Diabetes mellitus without complication ([place])    type 2  . Fracture dislocation of right ankle joint 06-04-2013  . Fracture of distal fibula 06-04-2013  . History of kidney stones    passed stones, no surgery required  . Hypercholesteremia   . Hypertension   . Polycystic ovary disease   . PONV (postoperative nausea and vomiting)   . Seasonal allergies   . SVD (spontaneous vaginal delivery)    x 1    Past Surgical History:  Procedure Laterality Date  . CARPAL TUNNEL RELEASE     bilateral  . CERVICAL CERCLAGE   07/1991; 05/1999   x 2  . CESAREAN SECTION     x 1  . CHOLECYSTECTOMY, LAPAROSCOPIC  1996  . CYSTOSCOPY N/A 06/07/2014   Procedure: CYSTOSCOPY;  Surgeon: Megan Salon, MD;  Location: [facility];  Service: Gynecology;  Laterality: N/A;  . LAPAROSCOPIC BILATERAL SALPINGO OOPHERECTOMY Right 06/07/2014   Procedure: LAPAROSCOPIC RIGHT SALPINGO OOPHORECTOMY/COLLECTION OF PELVIC WASHINGS;  Surgeon: Megan Salon, MD;  Location: [facility];  Service: Gynecology;  Laterality: Right;  . LAPAROSCOPIC UNILATERAL SALPINGECTOMY Left 06/07/2014   Procedure: LAPAROSCOPIC UNILATERAL SALPINGECTOMY;  Surgeon: Megan Salon, MD;  Location: [facility];  Service: Gynecology;  Laterality: Left;  . ORIF ANKLE FRACTURE Right 06/07/2013   Procedure: OPEN REDUCTION INTERNAL FIXATION (ORIF) ANKLE FRACTURE  FIBULA SYNDESMOSIS;  Surgeon: Ninetta Lights, MD;  Location: [facility];  Service: Orthopedics;  Laterality: Right;    Current Outpatient Prescriptions  Medication Sig Dispense Refill  . acetaminophen (TYLENOL) 500 MG tablet Take 1,000 mg by mouth every 6 (six) hours as needed for headache.    Marland Kitchen atorvastatin (LIPITOR) 40 MG tablet Take 1 tablet by mouth at bedtime.   1  . buPROPion (WELLBUTRIN XL) 300 MG 24 hr tablet Take 300 mg by mouth daily.    . calcium carbonate (TUMS - DOSED IN MG ELEMENTAL CALCIUM) 500 MG chewable tablet Chew 2 tablets by mouth daily as needed for  indigestion or heartburn.    . cetirizine (ZYRTEC ALLERGY) 10 MG tablet Take 1 tablet (10 mg total) by mouth daily.    . citalopram (CELEXA) 20 MG tablet Take 20 mg by mouth daily.    Marland Kitchen ibuprofen (ADVIL,MOTRIN) 800 MG tablet Take 1 tablet (800 mg total) by mouth every 8 (eight) hours as needed. 30 tablet 0  . magnesium 30 MG tablet Take 30 mg by mouth daily.    . metoprolol (LOPRESSOR) 100 MG tablet Take 100 mg by mouth 2 (two) times daily.    . Multiple Vitamin (MULTIVITAMIN) tablet Take 1 tablet by mouth daily.    . Multiple Vitamins-Minerals (HAIR  VITAMINS PO) Take 1 tablet by mouth daily.    . norethindrone (MICRONOR,CAMILA,ERRIN) 0.35 MG tablet Take 1 tablet (0.35 mg total) by mouth daily. 3 Package 4  . spironolactone (ALDACTONE) 100 MG tablet Take 1 tablet (100 mg total) by mouth 2 (two) times daily. 180 tablet 4  . SYNJARDY XR 07-998 MG TB24 Take 2 tablets by mouth daily.  4  . fluconazole (DIFLUCAN) 150 MG tablet Take one tablet weekly X 4 4 tablet 1   No current facility-administered medications for this visit.     Family History  Problem Relation Age of Onset  . Hypertension Mother   . Infertility Mother   . Deep vein thrombosis Mother   . Anxiety disorder Mother   . Depression Mother   . Hepatitis C Mother     blood transfusion  . Aneurysm Mother     [place]  . Cancer Father     gallbladder, renal  . Hypertension Father   . Anxiety disorder Father   . Depression Father   . Multiple births Sister   . Cancer Sister     thyroid cancer  . Thyroid disease Sister   . Anxiety disorder Sister   . Depression Sister   . Hypertension Brother   . Anxiety disorder Brother   . Depression Brother     ROS:  Pertinent items are noted in HPI.  Otherwise, a comprehensive ROS was negative.  Exam:   BP 100/66 (BP Location: Right Arm, Patient Position: Sitting, Cuff Size: Large)   Pulse 68   Ht 5' 1.75" (1.568 m)   Wt 191 lb (86.6 kg)   LMP 12/31/2015 (Within Weeks)   BMI 35.22 kg/m  Height: 5' 1.75" (156.8 cm) Ht Readings from Last 3 Encounters:  04/22/16 5' 1.75" (1.568 m)  06/08/15 5\' 2"  (1.575 m)  04/20/15 5\' 2"  (1.575 m)    General appearance: alert, cooperative and appears stated age Head: Normocephalic, without obvious abnormality, atraumatic Neck: no adenopathy, supple, symmetrical, trachea midline and thyroid normal to inspection and palpation Lungs: clear to auscultation bilaterally Breasts: normal appearance, no masses or tenderness Heart: regular rate and rhythm Abdomen: soft, non-tender; no masses,   no organomegaly Extremities: extremities normal, atraumatic, no cyanosis or edema Skin: Skin color, texture, turgor normal. No rashes or lesions Lymph nodes: Cervical, supraclavicular, and axillary nodes normal. No abnormal inguinal nodes palpated Neurologic: Grossly normal   Pelvic: External genitalia:  no lesions              Urethra:  normal appearing urethra with no masses, tenderness or lesions              Bartholin's and Skene's: normal                 Vagina: normal appearing vagina with normal color and discharge, no  lesions              Cervix: anteverted              Pap taken: No. Bimanual Exam:  Uterus:  normal size, contour, position, consistency, mobility, non-tender              Adnexa: no mass, fullness, tenderness               Rectovaginal: Confirms               Anus:  normal sphincter tone, no lesions  Chaperone present: yes  A:  Well Woman with normal exam  History of PCOS  History of disordered proliferative endometrium 04/2010 on POP             S/P RSO with bilateral salpingectomy secondary to OV cystadenoma 06/07/2014 History of situational stressors - doing well on med's History of DM, renal calculi, HTN  History of abnormal pap with ASCUS neg HR HPV 2014 and normal since    P:   Reviewed health and wellness pertinent to exam  Pap smear not done  Mammogram is due 05/2016  Will get immunization and most recent labs from PCP  Refill on Micronor and Aldactone for a year  Will check an Affirm but will treat for yeast vaginitis  Counseled on breast self exam, mammography screening, adequate intake of calcium and vitamin D, diet and exercise, Kegel's exercises return annually or prn  An After Visit Summary was printed and given to the patient.

## 2016-04-23 ENCOUNTER — Other Ambulatory Visit: Payer: Self-pay | Admitting: Nurse Practitioner

## 2016-04-23 LAB — WET PREP BY MOLECULAR PROBE
Candida species: NEGATIVE
Gardnerella vaginalis: POSITIVE — AB
Trichomonas vaginosis: NEGATIVE

## 2016-04-23 MED ORDER — METRONIDAZOLE 0.75 % VA GEL: 1.0000 | Freq: Every day | VAGINAL | 0 refills | Status: DC

## 2016-04-23 MED FILL — metroNIDAZOLE 0.75 % GEL: 0.75 | 7 days supply | Qty: 70 | Fill #0

## 2016-04-28 NOTE — Progress Notes (Signed)
Encounter reviewed by Dr. Lupe Handley Amundson C. Silva.  

## 2016-05-08 ENCOUNTER — Encounter: Payer: Self-pay | Admitting: Nurse Practitioner

## 2016-05-14 MED FILL — METOPROLOL TARTRATE 100 MG: 100 | 90 days supply | Qty: 180 | Fill #0

## 2016-05-14 MED FILL — ATORVASTATIN 40 MG TABLET: 40 | 90 days supply | Qty: 90 | Fill #3

## 2016-05-14 MED FILL — BUPROPION HCL XL 300 MG TAB: 300 | 90 days supply | Qty: 90 | Fill #0

## 2016-06-17 MED FILL — SYNJARDY XR 5-1,000 MG TAB: 5-1000 | 90 days supply | Qty: 180 | Fill #2

## 2016-06-17 MED FILL — spIRONOLACTONE 100 MG TAB: 100 | 90 days supply | Qty: 180 | Fill #0

## 2016-07-19 MED FILL — CITALOPRAM HBR 20 MG TABLET: 20 | 90 days supply | Qty: 90 | Fill #2

## 2016-07-19 MED FILL — NORETHINDRONE 0.35 MG TAB: 0.35 | 84 days supply | Qty: 84 | Fill #1

## 2016-08-08 DIAGNOSIS — J3089 Other allergic rhinitis: Secondary | ICD-10-CM | POA: Diagnosis not present

## 2016-08-08 DIAGNOSIS — J45990 Exercise induced bronchospasm: Secondary | ICD-10-CM | POA: Diagnosis not present

## 2016-08-08 DIAGNOSIS — J3081 Allergic rhinitis due to animal (cat) (dog) hair and dander: Secondary | ICD-10-CM | POA: Diagnosis not present

## 2016-08-08 DIAGNOSIS — J301 Allergic rhinitis due to pollen: Secondary | ICD-10-CM | POA: Diagnosis not present

## 2016-08-08 MED FILL — MONTELUKAST SOD 10 MG TAB: 10 | 30 days supply | Qty: 30 | Fill #0

## 2016-08-08 MED FILL — LEVOCETIRIZINE 5 MG TABLET: 5 | 30 days supply | Qty: 30 | Fill #0

## 2016-08-08 MED FILL — PROAIR RESPICLICK INHAL PWD: 108 (90 BAS | 17 days supply | Qty: 1 | Fill #0

## 2016-08-12 MED FILL — ATORVASTATIN 40 MG TABLET: 40 | 90 days supply | Qty: 90 | Fill #0

## 2016-08-20 DIAGNOSIS — J3081 Allergic rhinitis due to animal (cat) (dog) hair and dander: Secondary | ICD-10-CM | POA: Diagnosis not present

## 2016-08-20 DIAGNOSIS — J301 Allergic rhinitis due to pollen: Secondary | ICD-10-CM | POA: Diagnosis not present

## 2016-08-21 DIAGNOSIS — J3089 Other allergic rhinitis: Secondary | ICD-10-CM | POA: Diagnosis not present

## 2016-08-27 MED FILL — METOPROLOL TARTRATE 100 MG: 100 | 90 days supply | Qty: 180 | Fill #0

## 2016-09-12 MED FILL — LEVOCETIRIZINE 5 MG TABLET: 5 | 30 days supply | Qty: 30 | Fill #1

## 2016-09-12 MED FILL — BUPROPION HCL XL 300 MG TAB: 300 | 90 days supply | Qty: 90 | Fill #1

## 2016-09-12 MED FILL — spIRONOLACTONE 100 MG TAB: 100 | 90 days supply | Qty: 180 | Fill #1

## 2016-09-12 MED FILL — SYNJARDY XR 5-1,000 MG TAB: 5-1000 | 90 days supply | Qty: 180 | Fill #3

## 2016-09-12 MED FILL — MONTELUKAST SOD 10 MG TAB: 10 | 30 days supply | Qty: 30 | Fill #1

## 2016-09-18 DIAGNOSIS — Z8249 Family history of ischemic heart disease and other diseases of the circulatory system: Secondary | ICD-10-CM | POA: Diagnosis not present

## 2016-09-18 DIAGNOSIS — Z5181 Encounter for therapeutic drug level monitoring: Secondary | ICD-10-CM | POA: Diagnosis not present

## 2016-09-18 DIAGNOSIS — E282 Polycystic ovarian syndrome: Secondary | ICD-10-CM | POA: Diagnosis not present

## 2016-09-18 DIAGNOSIS — Z87442 Personal history of urinary calculi: Secondary | ICD-10-CM | POA: Diagnosis not present

## 2016-09-18 DIAGNOSIS — E119 Type 2 diabetes mellitus without complications: Secondary | ICD-10-CM | POA: Diagnosis not present

## 2016-09-18 MED FILL — SYNJARDY XR 12.5-1,000 MG T: 12.5-1000 | 30 days supply | Qty: 60 | Fill #0

## 2016-10-11 MED FILL — CITALOPRAM HBR 20 MG TABLET: 20 | 90 days supply | Qty: 90 | Fill #3

## 2016-10-11 MED FILL — LEVOCETIRIZINE 5 MG TABLET: 5 | 30 days supply | Qty: 30 | Fill #2

## 2016-10-11 MED FILL — MONTELUKAST SOD 10 MG TAB: 10 | 30 days supply | Qty: 30 | Fill #2

## 2016-10-11 MED FILL — NORETHINDRONE 0.35 MG TAB: 0.35 | 84 days supply | Qty: 84 | Fill #2

## 2016-10-15 DIAGNOSIS — J301 Allergic rhinitis due to pollen: Secondary | ICD-10-CM | POA: Diagnosis not present

## 2016-10-15 DIAGNOSIS — J3081 Allergic rhinitis due to animal (cat) (dog) hair and dander: Secondary | ICD-10-CM | POA: Diagnosis not present

## 2016-10-15 DIAGNOSIS — J3089 Other allergic rhinitis: Secondary | ICD-10-CM | POA: Diagnosis not present

## 2016-10-18 DIAGNOSIS — J3089 Other allergic rhinitis: Secondary | ICD-10-CM | POA: Diagnosis not present

## 2016-10-18 DIAGNOSIS — J301 Allergic rhinitis due to pollen: Secondary | ICD-10-CM | POA: Diagnosis not present

## 2016-10-18 DIAGNOSIS — J3081 Allergic rhinitis due to animal (cat) (dog) hair and dander: Secondary | ICD-10-CM | POA: Diagnosis not present

## 2016-10-25 DIAGNOSIS — J301 Allergic rhinitis due to pollen: Secondary | ICD-10-CM | POA: Diagnosis not present

## 2016-10-25 DIAGNOSIS — J3089 Other allergic rhinitis: Secondary | ICD-10-CM | POA: Diagnosis not present

## 2016-10-25 DIAGNOSIS — J3081 Allergic rhinitis due to animal (cat) (dog) hair and dander: Secondary | ICD-10-CM | POA: Diagnosis not present

## 2016-10-29 DIAGNOSIS — J3081 Allergic rhinitis due to animal (cat) (dog) hair and dander: Secondary | ICD-10-CM | POA: Diagnosis not present

## 2016-10-29 DIAGNOSIS — J3089 Other allergic rhinitis: Secondary | ICD-10-CM | POA: Diagnosis not present

## 2016-10-29 DIAGNOSIS — J301 Allergic rhinitis due to pollen: Secondary | ICD-10-CM | POA: Diagnosis not present

## 2016-11-04 DIAGNOSIS — J301 Allergic rhinitis due to pollen: Secondary | ICD-10-CM | POA: Diagnosis not present

## 2016-11-04 DIAGNOSIS — J3081 Allergic rhinitis due to animal (cat) (dog) hair and dander: Secondary | ICD-10-CM | POA: Diagnosis not present

## 2016-11-04 DIAGNOSIS — J3089 Other allergic rhinitis: Secondary | ICD-10-CM | POA: Diagnosis not present

## 2016-11-06 DIAGNOSIS — J3081 Allergic rhinitis due to animal (cat) (dog) hair and dander: Secondary | ICD-10-CM | POA: Diagnosis not present

## 2016-11-06 DIAGNOSIS — J301 Allergic rhinitis due to pollen: Secondary | ICD-10-CM | POA: Diagnosis not present

## 2016-11-06 DIAGNOSIS — J3089 Other allergic rhinitis: Secondary | ICD-10-CM | POA: Diagnosis not present

## 2016-11-07 MED FILL — FLUCONAZOLE 150 MG TABLET: 150 | 28 days supply | Qty: 4 | Fill #1

## 2016-11-07 MED FILL — ATORVASTATIN 40 MG TABLET: 40 | 90 days supply | Qty: 90 | Fill #1

## 2016-11-11 DIAGNOSIS — J3081 Allergic rhinitis due to animal (cat) (dog) hair and dander: Secondary | ICD-10-CM | POA: Diagnosis not present

## 2016-11-11 DIAGNOSIS — J3089 Other allergic rhinitis: Secondary | ICD-10-CM | POA: Diagnosis not present

## 2016-11-11 DIAGNOSIS — J301 Allergic rhinitis due to pollen: Secondary | ICD-10-CM | POA: Diagnosis not present

## 2016-11-13 DIAGNOSIS — J3089 Other allergic rhinitis: Secondary | ICD-10-CM | POA: Diagnosis not present

## 2016-11-13 DIAGNOSIS — J3081 Allergic rhinitis due to animal (cat) (dog) hair and dander: Secondary | ICD-10-CM | POA: Diagnosis not present

## 2016-11-13 DIAGNOSIS — J301 Allergic rhinitis due to pollen: Secondary | ICD-10-CM | POA: Diagnosis not present

## 2016-11-18 MED FILL — MONTELUKAST SOD 10 MG TAB: 10 | 30 days supply | Qty: 30 | Fill #3

## 2016-11-18 MED FILL — LEVOCETIRIZINE 5 MG TABLET: 5 | 30 days supply | Qty: 30 | Fill #3

## 2016-11-19 DIAGNOSIS — J301 Allergic rhinitis due to pollen: Secondary | ICD-10-CM | POA: Diagnosis not present

## 2016-11-19 DIAGNOSIS — J3089 Other allergic rhinitis: Secondary | ICD-10-CM | POA: Diagnosis not present

## 2016-11-19 DIAGNOSIS — J3081 Allergic rhinitis due to animal (cat) (dog) hair and dander: Secondary | ICD-10-CM | POA: Diagnosis not present

## 2016-11-21 DIAGNOSIS — J3081 Allergic rhinitis due to animal (cat) (dog) hair and dander: Secondary | ICD-10-CM | POA: Diagnosis not present

## 2016-11-21 DIAGNOSIS — J3089 Other allergic rhinitis: Secondary | ICD-10-CM | POA: Diagnosis not present

## 2016-11-21 DIAGNOSIS — J301 Allergic rhinitis due to pollen: Secondary | ICD-10-CM | POA: Diagnosis not present

## 2016-11-27 DIAGNOSIS — J3081 Allergic rhinitis due to animal (cat) (dog) hair and dander: Secondary | ICD-10-CM | POA: Diagnosis not present

## 2016-11-27 DIAGNOSIS — J301 Allergic rhinitis due to pollen: Secondary | ICD-10-CM | POA: Diagnosis not present

## 2016-11-27 DIAGNOSIS — J3089 Other allergic rhinitis: Secondary | ICD-10-CM | POA: Diagnosis not present

## 2016-12-03 DIAGNOSIS — J3089 Other allergic rhinitis: Secondary | ICD-10-CM | POA: Diagnosis not present

## 2016-12-03 DIAGNOSIS — J301 Allergic rhinitis due to pollen: Secondary | ICD-10-CM | POA: Diagnosis not present

## 2016-12-03 DIAGNOSIS — J3081 Allergic rhinitis due to animal (cat) (dog) hair and dander: Secondary | ICD-10-CM | POA: Diagnosis not present

## 2016-12-03 MED FILL — METOPROLOL TARTRATE 100 MG: 100 | 90 days supply | Qty: 180 | Fill #1

## 2016-12-05 DIAGNOSIS — J3089 Other allergic rhinitis: Secondary | ICD-10-CM | POA: Diagnosis not present

## 2016-12-05 DIAGNOSIS — J3081 Allergic rhinitis due to animal (cat) (dog) hair and dander: Secondary | ICD-10-CM | POA: Diagnosis not present

## 2016-12-05 DIAGNOSIS — J301 Allergic rhinitis due to pollen: Secondary | ICD-10-CM | POA: Diagnosis not present

## 2016-12-09 DIAGNOSIS — J3081 Allergic rhinitis due to animal (cat) (dog) hair and dander: Secondary | ICD-10-CM | POA: Diagnosis not present

## 2016-12-09 DIAGNOSIS — J301 Allergic rhinitis due to pollen: Secondary | ICD-10-CM | POA: Diagnosis not present

## 2016-12-09 DIAGNOSIS — J3089 Other allergic rhinitis: Secondary | ICD-10-CM | POA: Diagnosis not present

## 2016-12-17 DIAGNOSIS — J301 Allergic rhinitis due to pollen: Secondary | ICD-10-CM | POA: Diagnosis not present

## 2016-12-17 DIAGNOSIS — J3089 Other allergic rhinitis: Secondary | ICD-10-CM | POA: Diagnosis not present

## 2016-12-17 DIAGNOSIS — J3081 Allergic rhinitis due to animal (cat) (dog) hair and dander: Secondary | ICD-10-CM | POA: Diagnosis not present

## 2016-12-19 MED FILL — MONTELUKAST SOD 10 MG TAB: 10 | 30 days supply | Qty: 30 | Fill #4

## 2016-12-19 MED FILL — SYNJARDY XR 12.5-1,000 MG T: 12.5-1000 | 30 days supply | Qty: 60 | Fill #1

## 2016-12-19 MED FILL — LEVOCETIRIZINE 5 MG TABLET: 5 | 30 days supply | Qty: 30 | Fill #4

## 2016-12-19 MED FILL — BUPROPION HCL XL 300 MG TAB: 300 | 90 days supply | Qty: 90 | Fill #0

## 2016-12-19 MED FILL — SPIRONOLACTONE 100 MG TABS: 100 | 90 days supply | Qty: 180 | Fill #2

## 2016-12-20 DIAGNOSIS — J301 Allergic rhinitis due to pollen: Secondary | ICD-10-CM | POA: Diagnosis not present

## 2016-12-20 DIAGNOSIS — J3081 Allergic rhinitis due to animal (cat) (dog) hair and dander: Secondary | ICD-10-CM | POA: Diagnosis not present

## 2016-12-20 DIAGNOSIS — J3089 Other allergic rhinitis: Secondary | ICD-10-CM | POA: Diagnosis not present

## 2016-12-25 DIAGNOSIS — J301 Allergic rhinitis due to pollen: Secondary | ICD-10-CM | POA: Diagnosis not present

## 2016-12-25 DIAGNOSIS — J3081 Allergic rhinitis due to animal (cat) (dog) hair and dander: Secondary | ICD-10-CM | POA: Diagnosis not present

## 2016-12-25 DIAGNOSIS — J3089 Other allergic rhinitis: Secondary | ICD-10-CM | POA: Diagnosis not present

## 2016-12-27 DIAGNOSIS — J3081 Allergic rhinitis due to animal (cat) (dog) hair and dander: Secondary | ICD-10-CM | POA: Diagnosis not present

## 2016-12-27 DIAGNOSIS — J301 Allergic rhinitis due to pollen: Secondary | ICD-10-CM | POA: Diagnosis not present

## 2016-12-27 DIAGNOSIS — J3089 Other allergic rhinitis: Secondary | ICD-10-CM | POA: Diagnosis not present

## 2016-12-30 DIAGNOSIS — Z9189 Other specified personal risk factors, not elsewhere classified: Secondary | ICD-10-CM | POA: Diagnosis not present

## 2016-12-30 DIAGNOSIS — Z6835 Body mass index (BMI) 35.0-35.9, adult: Secondary | ICD-10-CM | POA: Diagnosis not present

## 2016-12-30 DIAGNOSIS — R5383 Other fatigue: Secondary | ICD-10-CM | POA: Diagnosis not present

## 2016-12-30 DIAGNOSIS — E669 Obesity, unspecified: Secondary | ICD-10-CM | POA: Diagnosis not present

## 2016-12-30 DIAGNOSIS — F329 Major depressive disorder, single episode, unspecified: Secondary | ICD-10-CM | POA: Diagnosis not present

## 2016-12-30 DIAGNOSIS — I10 Essential (primary) hypertension: Secondary | ICD-10-CM | POA: Diagnosis not present

## 2016-12-30 DIAGNOSIS — Z23 Encounter for immunization: Secondary | ICD-10-CM | POA: Diagnosis not present

## 2016-12-31 DIAGNOSIS — J301 Allergic rhinitis due to pollen: Secondary | ICD-10-CM | POA: Diagnosis not present

## 2016-12-31 DIAGNOSIS — J3089 Other allergic rhinitis: Secondary | ICD-10-CM | POA: Diagnosis not present

## 2016-12-31 DIAGNOSIS — J3081 Allergic rhinitis due to animal (cat) (dog) hair and dander: Secondary | ICD-10-CM | POA: Diagnosis not present

## 2017-01-02 MED FILL — VIT D2 1.25 MG (50,000 UNIT: 1.25 MG | 84 days supply | Qty: 24 | Fill #0

## 2017-01-03 DIAGNOSIS — J301 Allergic rhinitis due to pollen: Secondary | ICD-10-CM | POA: Diagnosis not present

## 2017-01-03 DIAGNOSIS — J3081 Allergic rhinitis due to animal (cat) (dog) hair and dander: Secondary | ICD-10-CM | POA: Diagnosis not present

## 2017-01-03 DIAGNOSIS — J3089 Other allergic rhinitis: Secondary | ICD-10-CM | POA: Diagnosis not present

## 2017-01-06 DIAGNOSIS — J3081 Allergic rhinitis due to animal (cat) (dog) hair and dander: Secondary | ICD-10-CM | POA: Diagnosis not present

## 2017-01-06 DIAGNOSIS — J3089 Other allergic rhinitis: Secondary | ICD-10-CM | POA: Diagnosis not present

## 2017-01-06 DIAGNOSIS — J45990 Exercise induced bronchospasm: Secondary | ICD-10-CM | POA: Diagnosis not present

## 2017-01-06 DIAGNOSIS — J301 Allergic rhinitis due to pollen: Secondary | ICD-10-CM | POA: Diagnosis not present

## 2017-01-06 MED FILL — EPINEPHRINE 0.3 MG AUTO-INJ: 0.3 | 30 days supply | Qty: 2 | Fill #0

## 2017-01-06 MED FILL — PROAIR RESPICLICK INHAL PWD: 108 (90 BAS | 30 days supply | Qty: 1 | Fill #0

## 2017-01-13 DIAGNOSIS — J301 Allergic rhinitis due to pollen: Secondary | ICD-10-CM | POA: Diagnosis not present

## 2017-01-13 DIAGNOSIS — J3081 Allergic rhinitis due to animal (cat) (dog) hair and dander: Secondary | ICD-10-CM | POA: Diagnosis not present

## 2017-01-13 DIAGNOSIS — J3089 Other allergic rhinitis: Secondary | ICD-10-CM | POA: Diagnosis not present

## 2017-01-16 MED FILL — LEVOCETIRIZINE 5 MG TABLET: 5 | 30 days supply | Qty: 30 | Fill #5

## 2017-01-16 MED FILL — NORETHINDRONE 0.35 MG TAB: 0.35 | 84 days supply | Qty: 84 | Fill #3

## 2017-01-16 MED FILL — MONTELUKAST SOD 10 MG TAB: 10 | 30 days supply | Qty: 30 | Fill #5

## 2017-01-20 DIAGNOSIS — J3089 Other allergic rhinitis: Secondary | ICD-10-CM | POA: Diagnosis not present

## 2017-01-20 DIAGNOSIS — J301 Allergic rhinitis due to pollen: Secondary | ICD-10-CM | POA: Diagnosis not present

## 2017-01-20 DIAGNOSIS — J3081 Allergic rhinitis due to animal (cat) (dog) hair and dander: Secondary | ICD-10-CM | POA: Diagnosis not present

## 2017-01-21 MED FILL — CITALOPRAM HBR 20 MG TABLET: 20 | 90 days supply | Qty: 90 | Fill #0

## 2017-01-24 DIAGNOSIS — J301 Allergic rhinitis due to pollen: Secondary | ICD-10-CM | POA: Diagnosis not present

## 2017-01-24 DIAGNOSIS — J3081 Allergic rhinitis due to animal (cat) (dog) hair and dander: Secondary | ICD-10-CM | POA: Diagnosis not present

## 2017-01-29 DIAGNOSIS — J3081 Allergic rhinitis due to animal (cat) (dog) hair and dander: Secondary | ICD-10-CM | POA: Diagnosis not present

## 2017-01-29 DIAGNOSIS — J3089 Other allergic rhinitis: Secondary | ICD-10-CM | POA: Diagnosis not present

## 2017-01-29 DIAGNOSIS — J301 Allergic rhinitis due to pollen: Secondary | ICD-10-CM | POA: Diagnosis not present

## 2017-02-03 DIAGNOSIS — J301 Allergic rhinitis due to pollen: Secondary | ICD-10-CM | POA: Diagnosis not present

## 2017-02-03 DIAGNOSIS — J3081 Allergic rhinitis due to animal (cat) (dog) hair and dander: Secondary | ICD-10-CM | POA: Diagnosis not present

## 2017-02-04 DIAGNOSIS — J3089 Other allergic rhinitis: Secondary | ICD-10-CM | POA: Diagnosis not present

## 2017-02-12 MED FILL — SYNJARDY XR 12.5-1,000 MG T: 12.5-1000 | 90 days supply | Qty: 180 | Fill #2

## 2017-02-12 MED FILL — ATORVASTATIN 40 MG TABLET: 40 | 90 days supply | Qty: 90 | Fill #2

## 2017-02-13 DIAGNOSIS — J301 Allergic rhinitis due to pollen: Secondary | ICD-10-CM | POA: Diagnosis not present

## 2017-02-13 DIAGNOSIS — J3081 Allergic rhinitis due to animal (cat) (dog) hair and dander: Secondary | ICD-10-CM | POA: Diagnosis not present

## 2017-02-13 DIAGNOSIS — J3089 Other allergic rhinitis: Secondary | ICD-10-CM | POA: Diagnosis not present

## 2017-02-14 MED FILL — LEVOCETIRIZINE 5 MG TABLET: 5 | 30 days supply | Qty: 30 | Fill #0

## 2017-02-14 MED FILL — MONTELUKAST SOD 10 MG TAB: 10 | 30 days supply | Qty: 30 | Fill #0

## 2017-02-25 DIAGNOSIS — J301 Allergic rhinitis due to pollen: Secondary | ICD-10-CM | POA: Diagnosis not present

## 2017-02-25 DIAGNOSIS — J3089 Other allergic rhinitis: Secondary | ICD-10-CM | POA: Diagnosis not present

## 2017-02-25 DIAGNOSIS — J3081 Allergic rhinitis due to animal (cat) (dog) hair and dander: Secondary | ICD-10-CM | POA: Diagnosis not present

## 2017-03-06 DIAGNOSIS — J3089 Other allergic rhinitis: Secondary | ICD-10-CM | POA: Diagnosis not present

## 2017-03-06 DIAGNOSIS — J3081 Allergic rhinitis due to animal (cat) (dog) hair and dander: Secondary | ICD-10-CM | POA: Diagnosis not present

## 2017-03-06 DIAGNOSIS — J301 Allergic rhinitis due to pollen: Secondary | ICD-10-CM | POA: Diagnosis not present

## 2017-03-12 MED FILL — METOPROLOL TARTRATE 100 MG: 100 | 90 days supply | Qty: 180 | Fill #0

## 2017-03-13 MED FILL — LEVOCETIRIZINE 5 MG TABLET: 5 | 30 days supply | Qty: 30 | Fill #1

## 2017-03-13 MED FILL — MONTELUKAST SOD 10 MG TAB: 10 | 30 days supply | Qty: 30 | Fill #1

## 2017-03-13 MED FILL — BUPROPION HCL XL 300 MG TAB: 300 | 90 days supply | Qty: 90 | Fill #0

## 2017-03-17 DIAGNOSIS — J3089 Other allergic rhinitis: Secondary | ICD-10-CM | POA: Diagnosis not present

## 2017-03-17 DIAGNOSIS — J3081 Allergic rhinitis due to animal (cat) (dog) hair and dander: Secondary | ICD-10-CM | POA: Diagnosis not present

## 2017-03-17 DIAGNOSIS — J301 Allergic rhinitis due to pollen: Secondary | ICD-10-CM | POA: Diagnosis not present

## 2017-03-18 DIAGNOSIS — E1165 Type 2 diabetes mellitus with hyperglycemia: Secondary | ICD-10-CM | POA: Diagnosis not present

## 2017-03-18 DIAGNOSIS — E282 Polycystic ovarian syndrome: Secondary | ICD-10-CM | POA: Diagnosis not present

## 2017-03-18 DIAGNOSIS — Z8249 Family history of ischemic heart disease and other diseases of the circulatory system: Secondary | ICD-10-CM | POA: Diagnosis not present

## 2017-03-18 DIAGNOSIS — E559 Vitamin D deficiency, unspecified: Secondary | ICD-10-CM | POA: Diagnosis not present

## 2017-03-18 DIAGNOSIS — G4700 Insomnia, unspecified: Secondary | ICD-10-CM | POA: Diagnosis not present

## 2017-03-18 DIAGNOSIS — D72829 Elevated white blood cell count, unspecified: Secondary | ICD-10-CM | POA: Diagnosis not present

## 2017-03-18 DIAGNOSIS — Z79899 Other long term (current) drug therapy: Secondary | ICD-10-CM | POA: Diagnosis not present

## 2017-03-18 DIAGNOSIS — E119 Type 2 diabetes mellitus without complications: Secondary | ICD-10-CM | POA: Diagnosis not present

## 2017-03-18 DIAGNOSIS — Z87442 Personal history of urinary calculi: Secondary | ICD-10-CM | POA: Diagnosis not present

## 2017-03-27 DIAGNOSIS — J3081 Allergic rhinitis due to animal (cat) (dog) hair and dander: Secondary | ICD-10-CM | POA: Diagnosis not present

## 2017-03-27 DIAGNOSIS — J301 Allergic rhinitis due to pollen: Secondary | ICD-10-CM | POA: Diagnosis not present

## 2017-03-27 DIAGNOSIS — J3089 Other allergic rhinitis: Secondary | ICD-10-CM | POA: Diagnosis not present

## 2017-04-01 HISTORY — PX: BREAST EXCISIONAL BIOPSY: SUR124

## 2017-04-03 DIAGNOSIS — E119 Type 2 diabetes mellitus without complications: Secondary | ICD-10-CM | POA: Diagnosis not present

## 2017-04-03 DIAGNOSIS — R718 Other abnormality of red blood cells: Secondary | ICD-10-CM | POA: Diagnosis not present

## 2017-04-03 DIAGNOSIS — R7989 Other specified abnormal findings of blood chemistry: Secondary | ICD-10-CM | POA: Diagnosis not present

## 2017-04-04 MED FILL — NORETHINDRONE 0.35 MG TAB: 0.35 | 84 days supply | Qty: 84 | Fill #4

## 2017-04-04 MED FILL — VIT D2 1.25 MG (50,000 UNIT: 1.25 MG | 84 days supply | Qty: 12 | Fill #0

## 2017-04-04 MED FILL — SPIRONOLACTONE 100 MG TABS: 100 | 90 days supply | Qty: 180 | Fill #3

## 2017-04-08 DIAGNOSIS — J3081 Allergic rhinitis due to animal (cat) (dog) hair and dander: Secondary | ICD-10-CM | POA: Diagnosis not present

## 2017-04-08 DIAGNOSIS — J3089 Other allergic rhinitis: Secondary | ICD-10-CM | POA: Diagnosis not present

## 2017-04-08 DIAGNOSIS — J301 Allergic rhinitis due to pollen: Secondary | ICD-10-CM | POA: Diagnosis not present

## 2017-04-09 DIAGNOSIS — R0681 Apnea, not elsewhere classified: Secondary | ICD-10-CM | POA: Diagnosis not present

## 2017-04-09 DIAGNOSIS — G2581 Restless legs syndrome: Secondary | ICD-10-CM | POA: Diagnosis not present

## 2017-04-17 DIAGNOSIS — J3089 Other allergic rhinitis: Secondary | ICD-10-CM | POA: Diagnosis not present

## 2017-04-17 DIAGNOSIS — J301 Allergic rhinitis due to pollen: Secondary | ICD-10-CM | POA: Diagnosis not present

## 2017-04-17 DIAGNOSIS — J3081 Allergic rhinitis due to animal (cat) (dog) hair and dander: Secondary | ICD-10-CM | POA: Diagnosis not present

## 2017-04-23 ENCOUNTER — Other Ambulatory Visit: Payer: Self-pay

## 2017-04-23 ENCOUNTER — Other Ambulatory Visit (HOSPITAL_COMMUNITY)
Admission: RE | Admit: 2017-04-23 | Discharge: 2017-04-23 | Disposition: A | Discharge status: Home / Self Care | Payer: 59 | Source: Ambulatory Visit | Attending: Certified Nurse Midwife | Admitting: Certified Nurse Midwife

## 2017-04-23 ENCOUNTER — Ambulatory Visit: Payer: 59 | Admitting: Nurse Practitioner

## 2017-04-23 ENCOUNTER — Ambulatory Visit (INDEPENDENT_AMBULATORY_CARE_PROVIDER_SITE_OTHER): Payer: 59 | Admitting: Certified Nurse Midwife

## 2017-04-23 ENCOUNTER — Encounter: Payer: Self-pay | Admitting: Certified Nurse Midwife

## 2017-04-23 VITALS — BP 100/66 | HR 68 | Resp 16 | Ht 61.75 in | Wt 191.0 lb

## 2017-04-23 DIAGNOSIS — Z1211 Encounter for screening for malignant neoplasm of colon: Secondary | ICD-10-CM

## 2017-04-23 DIAGNOSIS — Z8742 Personal history of other diseases of the female genital tract: Secondary | ICD-10-CM | POA: Diagnosis not present

## 2017-04-23 DIAGNOSIS — Z01419 Encounter for gynecological examination (general) (routine) without abnormal findings: Secondary | ICD-10-CM | POA: Diagnosis not present

## 2017-04-23 DIAGNOSIS — Z3041 Encounter for surveillance of contraceptive pills: Secondary | ICD-10-CM | POA: Diagnosis not present

## 2017-04-23 DIAGNOSIS — Z124 Encounter for screening for malignant neoplasm of cervix: Secondary | ICD-10-CM | POA: Diagnosis not present

## 2017-04-23 MED ORDER — NORETHINDRONE 0.35 MG PO TABS: 1.0000 | ORAL_TABLET | Freq: Every day | ORAL | 0 refills | Status: DC

## 2017-04-23 NOTE — Patient Instructions (Signed)

## 2017-04-23 NOTE — Progress Notes (Signed)
52 y.o. [id number] Married  Caucasian Fe here for annual exam.  Periods normal bleeding profile with POP. She notes spotting at times and then normal bleeding with period.Taking pills as directed. Patient had history of ovarian cysts when she had spotting with POP use in the past, but last PUS noted functional cyst that resolved.Marland Kitchen Has noted that hot flashes and night sweats have decreased. Sees PCP for aex/labs, anxiety medication, asthma. and sees Endocrine for diabetes and thyroid and lipid management with labs and medication. All stable per patient . Has forgotten to schedule Mammogram and aware due. Aware she has gained some weight and will be working with endocrine regarding. No other health concerns today. Daughter patient here needs aex soon and she would like for me to see her.  No LMP recorded. Patient is not currently having periods (Reason: Oral contraceptives).          Sexually active: Yes.    The current method of family planning is oral progesterone-only contraceptive.    Exercising: No.  exercise Smoker:  no  Health Maintenance: Pap:  04-14-14 neg HPV HR neg History of Abnormal Pap: yes MMG:  05-09-15 category b density birads 1:neg Self Breast exams: yes Colonoscopy:  none BMD:   2017 [facility] TDaP:  2015 Shingles: not done Pneumonia: not done Hep C and HIV: not done Labs: if needed. Flu vaccine yes   reports that  has never smoked. she has never used smokeless tobacco. She reports that she does not drink alcohol or use drugs.  Past Medical History:  Diagnosis Date  . Anxiety   . Depression   . Diabetes mellitus without complication ([place])    type 2  . Fracture dislocation of right ankle joint 06-04-2013  . Fracture of distal fibula 06-04-2013  . History of kidney stones    passed stones, no surgery required  . Hypercholesteremia   . Hypertension   . Polycystic ovary disease   . PONV (postoperative nausea and vomiting)   . Seasonal allergies   . SVD  (spontaneous vaginal delivery)    x 1    Past Surgical History:  Procedure Laterality Date  . CARPAL TUNNEL RELEASE     bilateral  . CERVICAL CERCLAGE  07/1991; 05/1999   x 2  . CESAREAN SECTION     x 1  . CHOLECYSTECTOMY, LAPAROSCOPIC  1996  . CYSTOSCOPY N/A 06/07/2014   Procedure: CYSTOSCOPY;  Surgeon: Megan Salon, MD;  Location: [facility];  Service: Gynecology;  Laterality: N/A;  . LAPAROSCOPIC BILATERAL SALPINGO OOPHERECTOMY Right 06/07/2014   Procedure: LAPAROSCOPIC RIGHT SALPINGO OOPHORECTOMY/COLLECTION OF PELVIC WASHINGS;  Surgeon: Megan Salon, MD;  Location: [facility];  Service: Gynecology;  Laterality: Right;  . LAPAROSCOPIC UNILATERAL SALPINGECTOMY Left 06/07/2014   Procedure: LAPAROSCOPIC UNILATERAL SALPINGECTOMY;  Surgeon: Megan Salon, MD;  Location: [facility];  Service: Gynecology;  Laterality: Left;  . ORIF ANKLE FRACTURE Right 06/07/2013   Procedure: OPEN REDUCTION INTERNAL FIXATION (ORIF) ANKLE FRACTURE  FIBULA SYNDESMOSIS;  Surgeon: Ninetta Lights, MD;  Location: [facility];  Service: Orthopedics;  Laterality: Right;    Current Outpatient Medications  Medication Sig Dispense Refill  . acetaminophen (TYLENOL) 500 MG tablet Take 1,000 mg by mouth every 6 (six) hours as needed for headache.    Marland Kitchen atorvastatin (LIPITOR) 40 MG tablet Take 1 tablet by mouth at bedtime.   1  . buPROPion (WELLBUTRIN XL) 300 MG 24 hr tablet Take 300 mg by mouth daily.    Marland Kitchen  calcium carbonate (TUMS - DOSED IN MG ELEMENTAL CALCIUM) 500 MG chewable tablet Chew 2 tablets by mouth daily as needed for indigestion or heartburn.    . citalopram (CELEXA) 20 MG tablet Take 20 mg by mouth daily.    Marland Kitchen ibuprofen (ADVIL,MOTRIN) 800 MG tablet Take 1 tablet (800 mg total) by mouth every 8 (eight) hours as needed. 30 tablet 0  . levocetirizine (XYZAL) 5 MG tablet Take 5 mg by mouth every evening.    . metoprolol (LOPRESSOR) 100 MG tablet Take 100 mg by mouth 2 (two) times daily.    . montelukast (SINGULAIR)  10 MG tablet Take 10 mg by mouth at bedtime.    . Multiple Vitamins-Minerals (HAIR VITAMINS PO) Take 1 tablet by mouth daily.    . norethindrone (MICRONOR,CAMILA,ERRIN) 0.35 MG tablet Take 1 tablet (0.35 mg total) by mouth daily. 3 Package 4  . spironolactone (ALDACTONE) 100 MG tablet Take 1 tablet (100 mg total) by mouth 2 (two) times daily. 180 tablet 4  . SYNJARDY XR 12.07-998 MG TB24 TAKE 2 TABLETS BY MOUTH WITH BREAKFAST ONCE A DAY  4  . UNABLE TO FIND Immunotherapy allergy shots once weekly    . fluconazole (DIFLUCAN) 150 MG tablet Take one tablet weekly X 4 (Patient not taking: Reported on 04/23/2017) 4 tablet 1   No current facility-administered medications for this visit.     Family History  Problem Relation Age of Onset  . Hypertension Mother   . Infertility Mother   . Deep vein thrombosis Mother   . Anxiety disorder Mother   . Depression Mother   . Hepatitis C Mother        blood transfusion  . Aneurysm Mother        [place]  . Cancer Father        gallbladder, renal  . Hypertension Father   . Anxiety disorder Father   . Depression Father   . Multiple births Sister   . Cancer Sister        thyroid cancer  . Thyroid disease Sister   . Anxiety disorder Sister   . Depression Sister   . Hypertension Brother   . Anxiety disorder Brother   . Depression Brother     ROS:  Pertinent items are noted in HPI.  Otherwise, a comprehensive ROS was negative.  Exam:   BP 100/66   Pulse 68   Resp 16   Ht 5' 1.75" (1.568 m)   Wt 191 lb (86.6 kg)   BMI 35.22 kg/m  Height: 5' 1.75" (156.8 cm) Ht Readings from Last 3 Encounters:  04/23/17 5' 1.75" (1.568 m)  04/22/16 5' 1.75" (1.568 m)  06/08/15 5\' 2"  (1.575 m)    General appearance: alert, cooperative and appears stated age Head: Normocephalic, without obvious abnormality, atraumatic Neck: no adenopathy, supple, symmetrical, trachea midline and thyroid normal to inspection and palpation Lungs: clear to auscultation  bilaterally Breasts: normal appearance, no masses or tenderness, No nipple retraction or dimpling, No nipple discharge or bleeding, No axillary or supraclavicular adenopathy Heart: regular rate and rhythm Abdomen: soft, non-tender; no masses,  no organomegaly Extremities: extremities normal, atraumatic, no cyanosis or edema Skin: Skin color, texture, turgor normal. No rashes or lesions Lymph nodes: Cervical, supraclavicular, and axillary nodes normal. No abnormal inguinal nodes palpated Neurologic: Grossly normal   Pelvic: External genitalia:  no lesions              Urethra:  normal appearing urethra with no masses, tenderness or  lesions              Bartholin's and Skene's: normal                 Vagina: normal appearing vagina with normal color and discharge, no lesions              Cervix: multiparous appearance, no cervical motion tenderness and no lesions              Pap taken: Yes.   Bimanual Exam:  Uterus:  normal size, contour, position, consistency, mobility, non-tender and anteverted              Adnexa: normal adnexa, no mass, fullness, tenderness and right ovary surgically removed, limited by morbid obesity               Rectovaginal: Confirms               Anus:  normal sphincter tone, no lesions  Chaperone present: yes  A:  Well Woman with normal exam  Contraception salpingectomy bilateral  POP use for cycle control desires continuance  History of Ovarian functional cyst  Diabetes, cholesterol,hypertension,anxiety management with MD  Obesity  Mammogram and colonoscopy due    P:   Reviewed health and wellness pertinent to exam  Discussed risks/benefits/warning signs of POP. Patient desires continuance.  Rx Micronor see order with instructions   Will discuss with MD if feels PUS is needed again per history. Discussed functional cyst etiology. Will advise patient if PUS needed.  Continue follow up with MD as indicated  Discussed importance of mammogram and also on  hormonal medication. Patient will schedule. Will need done to continue RX in future.  Discussed risks/benefits of colonoscopy, patient desires referral. She will be called with appointment information.  Pap smear: yes   counseled on breast self exam, mammography screening, feminine hygiene, adequate intake of calcium and vitamin D, diet and exercise and weight control for increase health and well being.  return annually or prn  An After Visit Summary was printed and given to the patient.

## 2017-04-25 DIAGNOSIS — J3081 Allergic rhinitis due to animal (cat) (dog) hair and dander: Secondary | ICD-10-CM | POA: Diagnosis not present

## 2017-04-25 DIAGNOSIS — J3089 Other allergic rhinitis: Secondary | ICD-10-CM | POA: Diagnosis not present

## 2017-04-25 DIAGNOSIS — J301 Allergic rhinitis due to pollen: Secondary | ICD-10-CM | POA: Diagnosis not present

## 2017-04-25 LAB — CYTOLOGY - PAP
Diagnosis: NEGATIVE
HPV: NOT DETECTED

## 2017-04-28 DIAGNOSIS — G4733 Obstructive sleep apnea (adult) (pediatric): Secondary | ICD-10-CM | POA: Diagnosis not present

## 2017-05-01 ENCOUNTER — Telehealth: Payer: Self-pay | Admitting: Certified Nurse Midwife

## 2017-05-01 DIAGNOSIS — J3089 Other allergic rhinitis: Secondary | ICD-10-CM | POA: Diagnosis not present

## 2017-05-01 DIAGNOSIS — J3081 Allergic rhinitis due to animal (cat) (dog) hair and dander: Secondary | ICD-10-CM | POA: Diagnosis not present

## 2017-05-01 DIAGNOSIS — J301 Allergic rhinitis due to pollen: Secondary | ICD-10-CM | POA: Diagnosis not present

## 2017-05-01 NOTE — Telephone Encounter (Signed)
Left voicemail regarding referral appointment. The information is listed below. Should the patient need to cancel or reschedule this appointment, Please advise them to call the office they've been referred to in order to reschedule.  [facility] [address] [place], [place]  Phone: [phone]  Dr. Collene Mares 05/27/17 @ 8:30 am. Please arrive 15 minutes early and bring your insurance card and photo id and list of medications.

## 2017-05-02 DIAGNOSIS — G4730 Sleep apnea, unspecified: Secondary | ICD-10-CM

## 2017-05-02 HISTORY — DX: Sleep apnea, unspecified: G47.30

## 2017-05-02 NOTE — Progress Notes (Signed)
Please call patient and let her know that I spoke with Dr. Talbert Nan and she does not think another PUS needed at this point.

## 2017-05-02 NOTE — Progress Notes (Signed)
Pt notified that no PUS is needed right now

## 2017-05-06 DIAGNOSIS — G4733 Obstructive sleep apnea (adult) (pediatric): Secondary | ICD-10-CM | POA: Diagnosis not present

## 2017-05-06 MED FILL — ATORVASTATIN 40 MG TABLET: 40 | 90 days supply | Qty: 90 | Fill #3

## 2017-05-06 MED FILL — LEVOCETIRIZINE 5 MG TABLET: 5 | 30 days supply | Qty: 30 | Fill #2

## 2017-05-06 MED FILL — MONTELUKAST SOD 10 MG TAB: 10 | 30 days supply | Qty: 30 | Fill #2

## 2017-05-07 DIAGNOSIS — J301 Allergic rhinitis due to pollen: Secondary | ICD-10-CM | POA: Diagnosis not present

## 2017-05-07 DIAGNOSIS — J3081 Allergic rhinitis due to animal (cat) (dog) hair and dander: Secondary | ICD-10-CM | POA: Diagnosis not present

## 2017-05-07 DIAGNOSIS — J3089 Other allergic rhinitis: Secondary | ICD-10-CM | POA: Diagnosis not present

## 2017-05-07 MED FILL — CITALOPRAM HBR 20 MG TABLET: 20 | 90 days supply | Qty: 90 | Fill #0

## 2017-05-16 DIAGNOSIS — J3081 Allergic rhinitis due to animal (cat) (dog) hair and dander: Secondary | ICD-10-CM | POA: Diagnosis not present

## 2017-05-16 DIAGNOSIS — J3089 Other allergic rhinitis: Secondary | ICD-10-CM | POA: Diagnosis not present

## 2017-05-16 DIAGNOSIS — J301 Allergic rhinitis due to pollen: Secondary | ICD-10-CM | POA: Diagnosis not present

## 2017-05-23 DIAGNOSIS — J3089 Other allergic rhinitis: Secondary | ICD-10-CM | POA: Diagnosis not present

## 2017-05-23 DIAGNOSIS — J3081 Allergic rhinitis due to animal (cat) (dog) hair and dander: Secondary | ICD-10-CM | POA: Diagnosis not present

## 2017-05-23 DIAGNOSIS — J301 Allergic rhinitis due to pollen: Secondary | ICD-10-CM | POA: Diagnosis not present

## 2017-05-27 ENCOUNTER — Other Ambulatory Visit: Payer: Self-pay | Admitting: Gastroenterology

## 2017-05-27 DIAGNOSIS — K219 Gastro-esophageal reflux disease without esophagitis: Secondary | ICD-10-CM | POA: Diagnosis not present

## 2017-05-27 DIAGNOSIS — Z1211 Encounter for screening for malignant neoplasm of colon: Secondary | ICD-10-CM | POA: Diagnosis not present

## 2017-05-27 DIAGNOSIS — R110 Nausea: Secondary | ICD-10-CM | POA: Diagnosis not present

## 2017-06-02 ENCOUNTER — Other Ambulatory Visit: Payer: Self-pay | Admitting: Certified Nurse Midwife

## 2017-06-02 DIAGNOSIS — Z1231 Encounter for screening mammogram for malignant neoplasm of breast: Secondary | ICD-10-CM

## 2017-06-03 DIAGNOSIS — G4733 Obstructive sleep apnea (adult) (pediatric): Secondary | ICD-10-CM | POA: Diagnosis not present

## 2017-06-04 DIAGNOSIS — J3081 Allergic rhinitis due to animal (cat) (dog) hair and dander: Secondary | ICD-10-CM | POA: Diagnosis not present

## 2017-06-04 DIAGNOSIS — J3089 Other allergic rhinitis: Secondary | ICD-10-CM | POA: Diagnosis not present

## 2017-06-04 DIAGNOSIS — J301 Allergic rhinitis due to pollen: Secondary | ICD-10-CM | POA: Diagnosis not present

## 2017-06-06 DIAGNOSIS — H524 Presbyopia: Secondary | ICD-10-CM | POA: Diagnosis not present

## 2017-06-06 DIAGNOSIS — H5213 Myopia, bilateral: Secondary | ICD-10-CM | POA: Diagnosis not present

## 2017-06-06 DIAGNOSIS — H52222 Regular astigmatism, left eye: Secondary | ICD-10-CM | POA: Diagnosis not present

## 2017-06-09 DIAGNOSIS — J301 Allergic rhinitis due to pollen: Secondary | ICD-10-CM | POA: Diagnosis not present

## 2017-06-09 DIAGNOSIS — J3081 Allergic rhinitis due to animal (cat) (dog) hair and dander: Secondary | ICD-10-CM | POA: Diagnosis not present

## 2017-06-09 DIAGNOSIS — J3089 Other allergic rhinitis: Secondary | ICD-10-CM | POA: Diagnosis not present

## 2017-06-09 MED FILL — METOPROLOL TARTRATE 100 MG: 100 | 90 days supply | Qty: 180 | Fill #1

## 2017-06-09 MED FILL — MONTELUKAST SOD 10 MG TAB: 10 | 30 days supply | Qty: 30 | Fill #3

## 2017-06-09 MED FILL — LEVOCETIRIZINE 5 MG TABLET: 5 | 30 days supply | Qty: 30 | Fill #3

## 2017-06-12 ENCOUNTER — Other Ambulatory Visit: Payer: Self-pay

## 2017-06-12 ENCOUNTER — Encounter (HOSPITAL_COMMUNITY): Payer: Self-pay | Admitting: Emergency Medicine

## 2017-06-17 DIAGNOSIS — J3081 Allergic rhinitis due to animal (cat) (dog) hair and dander: Secondary | ICD-10-CM | POA: Diagnosis not present

## 2017-06-17 DIAGNOSIS — J3089 Other allergic rhinitis: Secondary | ICD-10-CM | POA: Diagnosis not present

## 2017-06-17 DIAGNOSIS — J301 Allergic rhinitis due to pollen: Secondary | ICD-10-CM | POA: Diagnosis not present

## 2017-06-20 ENCOUNTER — Ambulatory Visit
Admission: RE | Admit: 2017-06-20 | Discharge: 2017-06-20 | Disposition: A | Discharge status: Home / Self Care | Payer: 59 | Source: Ambulatory Visit | Attending: Certified Nurse Midwife | Admitting: Certified Nurse Midwife

## 2017-06-20 DIAGNOSIS — Z1231 Encounter for screening mammogram for malignant neoplasm of breast: Secondary | ICD-10-CM

## 2017-06-23 ENCOUNTER — Other Ambulatory Visit: Payer: Self-pay | Admitting: Certified Nurse Midwife

## 2017-06-23 DIAGNOSIS — J3089 Other allergic rhinitis: Secondary | ICD-10-CM | POA: Diagnosis not present

## 2017-06-23 DIAGNOSIS — J301 Allergic rhinitis due to pollen: Secondary | ICD-10-CM | POA: Diagnosis not present

## 2017-06-23 DIAGNOSIS — R928 Other abnormal and inconclusive findings on diagnostic imaging of breast: Secondary | ICD-10-CM

## 2017-06-23 DIAGNOSIS — J3081 Allergic rhinitis due to animal (cat) (dog) hair and dander: Secondary | ICD-10-CM | POA: Diagnosis not present

## 2017-06-23 MED FILL — GAVILYTE-G SOLUTION: 236 | 1 days supply | Qty: 4000 | Fill #0

## 2017-06-23 MED FILL — BUPROPION HCL XL 300 MG TAB: 300 | 90 days supply | Qty: 90 | Fill #1

## 2017-06-24 ENCOUNTER — Ambulatory Visit (HOSPITAL_COMMUNITY): Payer: 59 | Admitting: Registered Nurse

## 2017-06-24 ENCOUNTER — Encounter (HOSPITAL_COMMUNITY): Payer: Self-pay

## 2017-06-24 ENCOUNTER — Ambulatory Visit (HOSPITAL_COMMUNITY)
Admission: RE | Admit: 2017-06-24 | Discharge: 2017-06-24 | Disposition: A | Discharge status: Home / Self Care | Payer: 59 | Source: Ambulatory Visit | Attending: Gastroenterology | Admitting: Gastroenterology

## 2017-06-24 ENCOUNTER — Encounter (HOSPITAL_COMMUNITY)
Admission: RE | Disposition: A | Discharge status: Home / Self Care | Payer: Self-pay | Source: Ambulatory Visit | Attending: Gastroenterology

## 2017-06-24 ENCOUNTER — Other Ambulatory Visit: Payer: Self-pay

## 2017-06-24 DIAGNOSIS — K219 Gastro-esophageal reflux disease without esophagitis: Secondary | ICD-10-CM | POA: Diagnosis not present

## 2017-06-24 DIAGNOSIS — I10 Essential (primary) hypertension: Secondary | ICD-10-CM | POA: Diagnosis not present

## 2017-06-24 DIAGNOSIS — Z79899 Other long term (current) drug therapy: Secondary | ICD-10-CM | POA: Insufficient documentation

## 2017-06-24 DIAGNOSIS — F419 Anxiety disorder, unspecified: Secondary | ICD-10-CM | POA: Diagnosis not present

## 2017-06-24 DIAGNOSIS — E119 Type 2 diabetes mellitus without complications: Secondary | ICD-10-CM | POA: Diagnosis not present

## 2017-06-24 DIAGNOSIS — E7800 Pure hypercholesterolemia, unspecified: Secondary | ICD-10-CM | POA: Diagnosis not present

## 2017-06-24 DIAGNOSIS — D124 Benign neoplasm of descending colon: Secondary | ICD-10-CM | POA: Diagnosis not present

## 2017-06-24 DIAGNOSIS — Z6834 Body mass index (BMI) 34.0-34.9, adult: Secondary | ICD-10-CM | POA: Insufficient documentation

## 2017-06-24 DIAGNOSIS — F329 Major depressive disorder, single episode, unspecified: Secondary | ICD-10-CM | POA: Insufficient documentation

## 2017-06-24 DIAGNOSIS — K5730 Diverticulosis of large intestine without perforation or abscess without bleeding: Secondary | ICD-10-CM | POA: Diagnosis not present

## 2017-06-24 DIAGNOSIS — D123 Benign neoplasm of transverse colon: Secondary | ICD-10-CM | POA: Diagnosis not present

## 2017-06-24 DIAGNOSIS — Z1211 Encounter for screening for malignant neoplasm of colon: Secondary | ICD-10-CM | POA: Diagnosis not present

## 2017-06-24 DIAGNOSIS — Z7901 Long term (current) use of anticoagulants: Secondary | ICD-10-CM | POA: Diagnosis not present

## 2017-06-24 DIAGNOSIS — G4730 Sleep apnea, unspecified: Secondary | ICD-10-CM | POA: Insufficient documentation

## 2017-06-24 DIAGNOSIS — D120 Benign neoplasm of cecum: Secondary | ICD-10-CM | POA: Diagnosis not present

## 2017-06-24 DIAGNOSIS — K635 Polyp of colon: Secondary | ICD-10-CM | POA: Insufficient documentation

## 2017-06-24 DIAGNOSIS — Z8742 Personal history of other diseases of the female genital tract: Secondary | ICD-10-CM

## 2017-06-24 HISTORY — PX: COLONOSCOPY WITH PROPOFOL: SHX5780

## 2017-06-24 LAB — GLUCOSE, CAPILLARY: Glucose-Capillary: 107 mg/dL — ABNORMAL HIGH (ref 65–99)

## 2017-06-24 SURGERY — COLONOSCOPY WITH PROPOFOL
Anesthesia: General

## 2017-06-24 MED ORDER — ONDANSETRON HCL 4 MG/2ML IJ SOLN
INTRAMUSCULAR | Status: DC | PRN
  Administered 2017-06-24: 4 mg via INTRAVENOUS

## 2017-06-24 MED ORDER — PROPOFOL 10 MG/ML IV BOLUS
INTRAVENOUS | Status: AC
  Filled 2017-06-24: qty 40

## 2017-06-24 MED ORDER — LACTATED RINGERS IV SOLN
INTRAVENOUS | Status: DC
  Administered 2017-06-24: 1000 mL via INTRAVENOUS

## 2017-06-24 MED ORDER — SODIUM CHLORIDE 0.9 % IV SOLN: INTRAVENOUS | Status: DC

## 2017-06-24 MED ORDER — PROPOFOL 500 MG/50ML IV EMUL
INTRAVENOUS | Status: DC | PRN
  Administered 2017-06-24: 140 ug/kg/min via INTRAVENOUS

## 2017-06-24 MED ORDER — PROPOFOL 10 MG/ML IV BOLUS
INTRAVENOUS | Status: DC | PRN
  Administered 2017-06-24 (×2): 20 mg via INTRAVENOUS

## 2017-06-24 MED ORDER — LIDOCAINE 2% (20 MG/ML) 5 ML SYRINGE
INTRAMUSCULAR | Status: DC | PRN
  Administered 2017-06-24: 80 mg via INTRAVENOUS

## 2017-06-24 SURGICAL SUPPLY — 21 items

## 2017-06-24 NOTE — Anesthesia Preprocedure Evaluation (Signed)
Anesthesia Evaluation  Patient identified by MRN, date of birth, ID band Patient awake    Reviewed: Allergy & Precautions, NPO status , Patient's Chart, lab work & pertinent test results  Airway Mallampati: III  TM Distance: >3 FB Neck ROM: Full    Dental  (+) Teeth Intact, Dental Advisory Given   Pulmonary    breath sounds clear to auscultation       Cardiovascular hypertension,  Rhythm:Regular Rate:Normal     Neuro/Psych    GI/Hepatic   Endo/Other  diabetes  Renal/GU      Musculoskeletal   Abdominal   Peds  Hematology   Anesthesia Other Findings   Reproductive/Obstetrics                             Anesthesia Physical Anesthesia Plan  ASA: III  Anesthesia Plan: General   Post-op Pain Management:    Induction: Intravenous  PONV Risk Score and Plan: 1 and Ondansetron and Propofol infusion  Airway Management Planned: Natural Airway and Nasal Cannula  Additional Equipment:   Intra-op Plan:   Post-operative Plan:   Informed Consent: I have reviewed the patients History and Physical, chart, labs and discussed the procedure including the risks, benefits and alternatives for the proposed anesthesia with the patient or authorized representative who has indicated his/her understanding and acceptance.     Plan Discussed with: CRNA and Anesthesiologist  Anesthesia Plan Comments:         Anesthesia Quick Evaluation

## 2017-06-24 NOTE — Anesthesia Postprocedure Evaluation (Signed)
Anesthesia Post Note  Patient: Nicole Khan  Procedure(s) Performed: COLONOSCOPY WITH PROPOFOL (N/A )     Patient location during evaluation: Endoscopy Anesthesia Type: General Level of consciousness: awake and alert Pain management: pain level controlled Vital Signs Assessment: post-procedure vital signs reviewed and stable Respiratory status: spontaneous breathing, nonlabored ventilation, respiratory function stable and patient connected to nasal cannula oxygen Cardiovascular status: stable and blood pressure returned to baseline Postop Assessment: no apparent nausea or vomiting Anesthetic complications: no    Last Vitals:  Vitals:   06/24/17 0813 06/24/17 0820  BP: 98/60 119/88  Pulse: 71 73  Resp: (!) 21 16  Temp: 36.4 C   SpO2: 100% 99%    Last Pain:  Vitals:   06/24/17 0820  TempSrc:   PainSc: 0-No pain                 Alexsus Papadopoulos COKER

## 2017-06-24 NOTE — Op Note (Signed)
[facility] Patient Name: Nicole Khan Procedure Date: 06/24/2017 MRN: [id number] Attending MD: Juanita Craver , MD Date of Birth: Oct 17, 1965 CSN: [id number] Age: 52 Admit Type: Outpatient Procedure:                Colonoscopy with biopsies & hot snare polypectomy x                            1. Indications:              CRC screening for colorectal malignant neoplasm. Providers:                Juanita Craver, MD, Cleda Daub, RN, Laurena Spies, Technician, Courtney Heys [place], CRNA Referring MD:             Melvia Heaps, NP, Katina Degree MD Medicines:                Monitored Anesthesia Care Complications:            No immediate complications. Estimated Blood Loss:     Estimated blood loss: minimal. Procedure:                Pre-anesthesia assessment: - Prior to the                            procedure, a history and physical was performed,                            and patient medications and allergies were                            reviewed. The patient's tolerance of previous                            anesthesia was also reviewed. The risks and                            benefits of the procedure and the sedation options                            and risks were discussed with the patient. All                            questions were answered, and informed consent was                            obtained. Prior anticoagulants: The patient has                            taken previous NSAID medication, last dose was 7                            days prior to procedure. ASA Grade Assessment: III                            -  A patient with severe systemic disease. After                            reviewing the risks and benefits, the patient was                            deemed in satisfactory condition to undergo the                            procedure.                           After obtaining informed consent, the  colonoscope                            was passed under direct vision. Throughout the                            procedure, the patient's blood pressure, pulse, and                            oxygen saturations were monitored continuously. The                            EC-[id number] ([id number]) scope was introduced through                            the anus and advanced to the the terminal ileum,                            with identification of the appendiceal orifice and                            IC valve. The colonoscopy was performed with                            moderate difficulty due to the patient's agitation.                            Successful completion of the procedure was aided by                            having anesthesiology staff obtain IV access. The                            patient tolerated the procedure well. The quality                            of the bowel preparation was adequate. The terminal                            ileum, the ileocecal valve, the appendiceal orifice  and the rectum were photographed. The bowel                            preparation used was GoLYTELY. Scope In: 7:41:30 AM Scope Out: 8:07:01 AM Scope Withdrawal Time: 0 hours 17 minutes 49 seconds  Total Procedure Duration: 0 hours 25 minutes 31 seconds  Findings:      Three small sessile polyps were found, 1 in the distal descending colon,       1 in the mid-transverse colon and 1 in the cecum-these were removed by       cold biopsies.      A 7 mm sessile polyp was found in the proximal descending colon; the       polyp was removed with a hot snare x 1-200/20; resection and retrieval       were complete.      A few small-mouthed diverticula were found in the sigmoid colon.      The terminal ileum appeared normal.      The exam was otherwise without abnormality on direct and retroflexion       views. Impression:               - Three small sessile polyps, 1 in  the distal                            descending colon, 1 in the mid-transverse colon and                            1 in the cecum-removed by cold biopsies.                           - One 7 mm sessile polyp in the proximal descending                            colon, removed with a hot snare x 1; resected and                            retrieved.                           - A few diverticula in the sigmoid colon.                           - The examined portion of the ileum was normal.                           - The examination was otherwise normal on direct                            and retroflexion views. Moderate Sedation:      MAC used. Recommendation:           - High fiber diet with augmented water consumption                            daily.                           -  Continue present medications.                           - Await pathology results.                           - No Ibuprofen, Naproxen, or other non-steroidal                            anti-inflammatory drugs for 2 weeks after polyp                            removal.                           - Return to my office PRN.                           - If the patient has any abnormal GI symptoms in                            the interim, she has been advised to call the                            office ASAP for further recommendations.                           - Repeat colonoscopy in 5-10 years for                            surveillance, depending on the pathology results. Procedure Code(s):        --- Professional ---                           [phone], Colonoscopy, flexible; with removal of                            tumor(s), polyp(s), or other lesion(s) by snare                            technique                           [id number], 66, Colonoscopy, flexible; with biopsy,                            single or multiple Diagnosis Code(s):        --- Professional ---                           Z12.11, Encounter for  screening for malignant                            neoplasm of colon                           D12.0,  Benign neoplasm of cecum                           D12.3, Benign neoplasm of transverse colon (hepatic                            flexure or splenic flexure)                           D12.4, Benign neoplasm of descending colon                           K57.30, Diverticulosis of large intestine without                            perforation or abscess without bleeding CPT copyright 2016 American [employer]. All rights reserved. The codes documented in this report are preliminary and upon coder review may  be revised to meet current compliance requirements. Juanita Craver, MD Juanita Craver, MD 06/24/2017 8:29:04 AM This report has been signed electronically. Number of Addenda: 0

## 2017-06-24 NOTE — Discharge Instructions (Signed)

## 2017-06-24 NOTE — H&P (Signed)
Nicole Khan is an 52 y.o. female.   Chief Complaint: Colorectal cancer screening. HPI: 52 year old white female here for a screening colonoscopy. She has severe sleep apnea. See office notes for details.  Past Medical History:  Diagnosis Date  . Anxiety   . Depression   . Diabetes mellitus without complication ([place])    type 2  . Fracture dislocation of right ankle joint 06-04-2013  . Fracture of distal fibula 06-04-2013  . History of kidney stones    passed stones, no surgery required  . Hypercholesteremia   . Hypertension   . Polycystic ovary disease   . PONV (postoperative nausea and vomiting)   . Seasonal allergies   . SVD (spontaneous vaginal delivery)    x 1   Past Surgical History:  Procedure Laterality Date  . CARPAL TUNNEL RELEASE     bilateral  . CERVICAL CERCLAGE  07/1991; 05/1999   x 2  . CESAREAN SECTION     x 1  . CHOLECYSTECTOMY, LAPAROSCOPIC  1996  . CYSTOSCOPY N/A 06/07/2014   Procedure: CYSTOSCOPY;  Surgeon: Megan Salon, MD;  Location: [facility];  Service: Gynecology;  Laterality: N/A;  . LAPAROSCOPIC BILATERAL SALPINGO OOPHERECTOMY Right 06/07/2014   Procedure: LAPAROSCOPIC RIGHT SALPINGO OOPHORECTOMY/COLLECTION OF PELVIC WASHINGS;  Surgeon: Megan Salon, MD;  Location: [facility];  Service: Gynecology;  Laterality: Right;  . LAPAROSCOPIC UNILATERAL SALPINGECTOMY Left 06/07/2014   Procedure: LAPAROSCOPIC UNILATERAL SALPINGECTOMY;  Surgeon: Megan Salon, MD;  Location: [facility];  Service: Gynecology;  Laterality: Left;  . ORIF ANKLE FRACTURE Right 06/07/2013   Procedure: OPEN REDUCTION INTERNAL FIXATION (ORIF) ANKLE FRACTURE  FIBULA SYNDESMOSIS;  Surgeon: Ninetta Lights, MD;  Location: [facility];  Service: Orthopedics;  Laterality: Right;   Family History  Problem Relation Age of Onset  . Hypertension Mother   . Infertility Mother   . Deep vein thrombosis Mother   . Anxiety disorder Mother   . Depression Mother   . Hepatitis C Mother        blood  transfusion  . Aneurysm Mother        [place]  . Cancer Father        gallbladder, renal  . Hypertension Father   . Anxiety disorder Father   . Depression Father   . Multiple births Sister   . Cancer Sister        thyroid cancer  . Thyroid disease Sister   . Anxiety disorder Sister   . Depression Sister   . Hypertension Brother   . Anxiety disorder Brother   . Depression Brother    Social History:  reports that she has never smoked. She has never used smokeless tobacco. She reports that she does not drink alcohol or use drugs.  Allergies:  Allergies  Allergen Reactions  . Soy Allergy Anaphylaxis and Other (See Comments)    Soy milk  . Celery Oil Itching and Other (See Comments)    Causes itching in the mouth.  . Eggs Or Egg-Derived Products Nausea Only  . Hazel Tree Pollen [Corylus] Swelling and Other (See Comments)    Hazelnuts, makes patients mouth swell   Medications Prior to Admission  Medication Sig Dispense Refill  . acetaminophen (TYLENOL) 500 MG tablet Take 1,000 mg by mouth every 6 (six) hours as needed for headache.    Marland Kitchen atorvastatin (LIPITOR) 40 MG tablet Take 40 mg by mouth at bedtime.   1  . buPROPion (WELLBUTRIN XL) 300 MG 24  hr tablet Take 300 mg by mouth daily.    . citalopram (CELEXA) 20 MG tablet Take 20 mg by mouth daily.    . ergocalciferol (VITAMIN D2) 50000 units capsule Take 50,000 Units by mouth every Friday.    . fluticasone (FLONASE) 50 MCG/ACT nasal spray Place 1 spray into both nostrils at bedtime.    Marland Kitchen levocetirizine (XYZAL) 5 MG tablet Take 5 mg by mouth daily.     . metoprolol (LOPRESSOR) 100 MG tablet Take 100 mg by mouth 2 (two) times daily.    . montelukast (SINGULAIR) 10 MG tablet Take 10 mg by mouth daily.     . Multiple Vitamins-Minerals (HAIR VITAMINS PO) Take 1 tablet by mouth daily.    . norethindrone (MICRONOR,CAMILA,ERRIN) 0.35 MG tablet Take 1 tablet (0.35 mg total) by mouth daily. (Patient taking differently: Take 1 tablet by  mouth at bedtime. ) 3 Package 0  . phenylephrine (SUDAFED PE) 10 MG TABS tablet Take 10 mg by mouth every 6 (six) hours as needed (for congestion).    Marland Kitchen spironolactone (ALDACTONE) 100 MG tablet Take 1 tablet (100 mg total) by mouth 2 (two) times daily. 180 tablet 4  . SYNJARDY XR 12.07-998 MG TB24 TAKE 2 TABLETS BY MOUTH WITH BREAKFAST ONCE A DAY  4  . UNABLE TO FIND Immunotherapy Allergy Shot. Injection to each arm once weekly on various day    . albuterol (PROVENTIL HFA;VENTOLIN HFA) 108 (90 Base) MCG/ACT inhaler Inhale 1 puff into the lungs every 6 (six) hours as needed for wheezing or shortness of breath.    . calcium carbonate (TUMS - DOSED IN MG ELEMENTAL CALCIUM) 500 MG chewable tablet Chew 2 tablets by mouth daily as needed for indigestion or heartburn.    Marland Kitchen ibuprofen (ADVIL,MOTRIN) 800 MG tablet Take 1 tablet (800 mg total) by mouth every 8 (eight) hours as needed. 30 tablet 0   Results for orders placed or performed during the hospital encounter of 06/24/17 (from the past 48 hour(s))  Glucose, capillary     Status: Abnormal   Collection Time: 06/24/17  6:46 AM  Result Value Ref Range   Glucose-Capillary 107 (H) 65 - 99 mg/dL   No results found.  Review of Systems  Constitutional: Negative.   HENT: Negative.   Eyes: Negative.   Respiratory: Negative.   Cardiovascular: Negative.   Gastrointestinal: Positive for heartburn.  Genitourinary: Negative.   Musculoskeletal: Negative.   Skin: Negative.   Neurological: Positive for headaches.  Endo/Heme/Allergies: Negative.   Psychiatric/Behavioral: Positive for depression. The patient is nervous/anxious.    Blood pressure (!) 121/45, pulse 62, temperature 98.1 F (36.7 C), temperature source Oral, resp. rate 18, height 5\' 2"  (1.575 m), weight 86.6 kg (191 lb), SpO2 95 %. Physical Exam  Constitutional: She is oriented to person, place, and time. She appears well-developed and well-nourished.  Morbidly obese  HENT:  Head:  Normocephalic and atraumatic.  Eyes: Pupils are equal, round, and reactive to light. Conjunctivae and EOM are normal.  Neck: Normal range of motion. Neck supple.  Cardiovascular: Normal rate and regular rhythm.  Respiratory: Breath sounds normal.  GI: Soft. Bowel sounds are normal.  Musculoskeletal: Normal range of motion.  Neurological: She is alert and oriented to person, place, and time.  Skin: Skin is warm and dry.  Psychiatric: She has a normal mood and affect. Her behavior is normal. Judgment and thought content normal.    Assessment/Plan 1) Colorectal cancer screening: proceed with a colonoscopy at this time. 2)  Acid reflux. 3) ?Lactose/Gluten intolerance.  4) Morbid obesity. Desirre Eickhoff, MD 06/24/2017, 7:29 AM

## 2017-06-24 NOTE — Transfer of Care (Signed)
Immediate Anesthesia Transfer of Care Note  Patient: Nicole Khan  Procedure(s) Performed: COLONOSCOPY WITH PROPOFOL (N/A )  Patient Location: PACU and Endoscopy Unit  Anesthesia Type:MAC  Level of Consciousness: awake, alert , oriented and patient cooperative  Airway & Oxygen Therapy: Patient Spontanous Breathing and Patient connected to face mask oxygen  Post-op Assessment: Report given to RN, Post -op Vital signs reviewed and stable and Patient moving all extremities  Post vital signs: Reviewed and stable  Last Vitals:  Vitals Value Taken Time  BP 98/60 06/24/2017  8:12 AM  Temp    Pulse 70 06/24/2017  8:13 AM  Resp 19 06/24/2017  8:13 AM  SpO2 100 % 06/24/2017  8:13 AM  Vitals shown include unvalidated device data.  Last Pain:  Vitals:   06/24/17 0636  TempSrc: Oral  PainSc: 0-No pain         Complications: No apparent anesthesia complications

## 2017-06-26 ENCOUNTER — Encounter (HOSPITAL_COMMUNITY): Payer: Self-pay | Admitting: Gastroenterology

## 2017-06-27 ENCOUNTER — Ambulatory Visit
Admission: RE | Admit: 2017-06-27 | Discharge: 2017-06-27 | Disposition: A | Discharge status: Home / Self Care | Payer: 59 | Source: Ambulatory Visit | Attending: Certified Nurse Midwife | Admitting: Certified Nurse Midwife

## 2017-06-27 ENCOUNTER — Other Ambulatory Visit: Payer: Self-pay | Admitting: Certified Nurse Midwife

## 2017-06-27 DIAGNOSIS — R928 Other abnormal and inconclusive findings on diagnostic imaging of breast: Secondary | ICD-10-CM

## 2017-06-27 DIAGNOSIS — N6489 Other specified disorders of breast: Secondary | ICD-10-CM | POA: Diagnosis not present

## 2017-06-30 ENCOUNTER — Ambulatory Visit
Admission: RE | Admit: 2017-06-30 | Discharge: 2017-06-30 | Disposition: A | Discharge status: Home / Self Care | Payer: 59 | Source: Ambulatory Visit | Attending: Certified Nurse Midwife | Admitting: Certified Nurse Midwife

## 2017-06-30 ENCOUNTER — Other Ambulatory Visit: Payer: Self-pay | Admitting: Certified Nurse Midwife

## 2017-06-30 DIAGNOSIS — N6489 Other specified disorders of breast: Secondary | ICD-10-CM | POA: Diagnosis not present

## 2017-06-30 DIAGNOSIS — N6012 Diffuse cystic mastopathy of left breast: Secondary | ICD-10-CM | POA: Diagnosis not present

## 2017-07-02 DIAGNOSIS — J3089 Other allergic rhinitis: Secondary | ICD-10-CM | POA: Diagnosis not present

## 2017-07-02 DIAGNOSIS — J301 Allergic rhinitis due to pollen: Secondary | ICD-10-CM | POA: Diagnosis not present

## 2017-07-02 DIAGNOSIS — J3081 Allergic rhinitis due to animal (cat) (dog) hair and dander: Secondary | ICD-10-CM | POA: Diagnosis not present

## 2017-07-04 DIAGNOSIS — G4733 Obstructive sleep apnea (adult) (pediatric): Secondary | ICD-10-CM | POA: Diagnosis not present

## 2017-07-09 DIAGNOSIS — J3081 Allergic rhinitis due to animal (cat) (dog) hair and dander: Secondary | ICD-10-CM | POA: Diagnosis not present

## 2017-07-09 DIAGNOSIS — J301 Allergic rhinitis due to pollen: Secondary | ICD-10-CM | POA: Diagnosis not present

## 2017-07-09 DIAGNOSIS — J3089 Other allergic rhinitis: Secondary | ICD-10-CM | POA: Diagnosis not present

## 2017-07-09 MED FILL — MONTELUKAST SOD 10 MG TAB: 10 | 30 days supply | Qty: 30 | Fill #4

## 2017-07-09 MED FILL — VIT D2 1.25 MG (50,000 UNIT: 1.25 MG | 84 days supply | Qty: 12 | Fill #1

## 2017-07-09 MED FILL — NORETHINDRONE 0.35 MG TAB: 0.35 | 84 days supply | Qty: 84 | Fill #0

## 2017-07-09 MED FILL — LEVOCETIRIZINE 5 MG TABLET: 5 | 30 days supply | Qty: 30 | Fill #4

## 2017-07-10 ENCOUNTER — Other Ambulatory Visit: Payer: Self-pay

## 2017-07-10 DIAGNOSIS — R928 Other abnormal and inconclusive findings on diagnostic imaging of breast: Secondary | ICD-10-CM | POA: Diagnosis not present

## 2017-07-10 DIAGNOSIS — I10 Essential (primary) hypertension: Secondary | ICD-10-CM | POA: Diagnosis not present

## 2017-07-10 DIAGNOSIS — Z90721 Acquired absence of ovaries, unilateral: Secondary | ICD-10-CM | POA: Diagnosis not present

## 2017-07-10 DIAGNOSIS — E119 Type 2 diabetes mellitus without complications: Secondary | ICD-10-CM | POA: Diagnosis not present

## 2017-07-10 DIAGNOSIS — E282 Polycystic ovarian syndrome: Secondary | ICD-10-CM | POA: Diagnosis not present

## 2017-07-10 DIAGNOSIS — Z9049 Acquired absence of other specified parts of digestive tract: Secondary | ICD-10-CM | POA: Diagnosis not present

## 2017-07-10 DIAGNOSIS — Z98891 History of uterine scar from previous surgery: Secondary | ICD-10-CM | POA: Diagnosis not present

## 2017-07-10 DIAGNOSIS — Z6836 Body mass index (BMI) 36.0-36.9, adult: Secondary | ICD-10-CM | POA: Diagnosis not present

## 2017-07-10 DIAGNOSIS — G4730 Sleep apnea, unspecified: Secondary | ICD-10-CM | POA: Diagnosis not present

## 2017-07-10 MED FILL — SYNJARDY XR 12.5-1,000 MG T: 12.5-1000 | 90 days supply | Qty: 180 | Fill #3

## 2017-07-10 NOTE — Telephone Encounter (Signed)
Received faxed refill request from [facility] for the following:  Medication refill request: Spironolactone 100mg  #180,4R Last AEX:  04-23-17 Next AEX: 04-24-18 Last MMG (if hormonal medication request): 06-20-17 BiRads4:Suspicious with Lt.Br.Bx revealing only fibrocystic changes Refill authorized: Please advise

## 2017-07-10 NOTE — Telephone Encounter (Signed)
She was having incisional biopsy of breast today. Hold refill

## 2017-07-14 NOTE — Telephone Encounter (Signed)
Nicole Khan, Breast Bx revealed fibrocystic changes only. Please see Epic and advise about refill. Routed to Melvia Heaps, CNM

## 2017-07-22 DIAGNOSIS — J3089 Other allergic rhinitis: Secondary | ICD-10-CM | POA: Diagnosis not present

## 2017-07-22 DIAGNOSIS — J301 Allergic rhinitis due to pollen: Secondary | ICD-10-CM | POA: Diagnosis not present

## 2017-07-22 DIAGNOSIS — J3081 Allergic rhinitis due to animal (cat) (dog) hair and dander: Secondary | ICD-10-CM | POA: Diagnosis not present

## 2017-07-28 ENCOUNTER — Other Ambulatory Visit: Payer: Self-pay | Admitting: General Surgery

## 2017-07-28 DIAGNOSIS — G4733 Obstructive sleep apnea (adult) (pediatric): Secondary | ICD-10-CM | POA: Diagnosis not present

## 2017-07-28 DIAGNOSIS — R928 Other abnormal and inconclusive findings on diagnostic imaging of breast: Secondary | ICD-10-CM

## 2017-07-31 DIAGNOSIS — J3089 Other allergic rhinitis: Secondary | ICD-10-CM | POA: Diagnosis not present

## 2017-07-31 DIAGNOSIS — J3081 Allergic rhinitis due to animal (cat) (dog) hair and dander: Secondary | ICD-10-CM | POA: Diagnosis not present

## 2017-07-31 DIAGNOSIS — J301 Allergic rhinitis due to pollen: Secondary | ICD-10-CM | POA: Diagnosis not present

## 2017-08-03 DIAGNOSIS — G4733 Obstructive sleep apnea (adult) (pediatric): Secondary | ICD-10-CM | POA: Diagnosis not present

## 2017-08-06 DIAGNOSIS — J301 Allergic rhinitis due to pollen: Secondary | ICD-10-CM | POA: Diagnosis not present

## 2017-08-06 DIAGNOSIS — J3089 Other allergic rhinitis: Secondary | ICD-10-CM | POA: Diagnosis not present

## 2017-08-06 DIAGNOSIS — J3081 Allergic rhinitis due to animal (cat) (dog) hair and dander: Secondary | ICD-10-CM | POA: Diagnosis not present

## 2017-08-07 ENCOUNTER — Telehealth: Payer: Self-pay | Admitting: Certified Nurse Midwife

## 2017-08-07 MED FILL — CITALOPRAM HBR 20 MG TABLET: 20 | 90 days supply | Qty: 90 | Fill #0

## 2017-08-07 MED FILL — MONTELUKAST SOD 10 MG TAB: 10 | 30 days supply | Qty: 30 | Fill #5

## 2017-08-07 MED FILL — LEVOCETIRIZINE 5 MG TABLET: 5 | 30 days supply | Qty: 30 | Fill #5

## 2017-08-07 NOTE — Telephone Encounter (Signed)
Med refill request: Spironolactone 100mg  tab bid  Last AEX: 04/23/17 DL Next AEX: 04/24/18 DL Last MMG: 06/20/17  Last filled 04/22/16 #180/4RF by PG  Melvia Heaps, CNM -please advise on refill?

## 2017-08-07 NOTE — Telephone Encounter (Signed)
Spoke with patient, advised as seen below per Melvia Heaps, CNM. Patient is agreeable to plan.   Routing to provider for final review. Patient is agreeable to disposition. Will close encounter.

## 2017-08-07 NOTE — Telephone Encounter (Signed)
Left message to call Sharee Pimple at [phone].  Reviewed with Melvia Heaps, CNM. Spironolactone can have hormonal effect, not recommended until breast evaluation and surgery is completed.

## 2017-08-07 NOTE — Telephone Encounter (Signed)
She was on hold due to have follow up breast evaluation with surgeon, has been in hold until done. See chart notes

## 2017-08-07 NOTE — Telephone Encounter (Signed)
Patient needs refill on her spironolactone prescription

## 2017-08-07 NOTE — Telephone Encounter (Signed)
Left message to call Aspen Lawrance at [phone].  

## 2017-08-07 NOTE — Telephone Encounter (Signed)
Spoke with patient, advised as seen below per Melvia Heaps, CNM. Patient is scheduled with Dr. Dalbert Batman on 09/26/17 for surgery.   Patient has additional question about spironolactone, advised will review with provider and return call.

## 2017-08-08 ENCOUNTER — Other Ambulatory Visit: Payer: Self-pay | Admitting: General Surgery

## 2017-08-08 DIAGNOSIS — R928 Other abnormal and inconclusive findings on diagnostic imaging of breast: Secondary | ICD-10-CM

## 2017-08-15 MED FILL — ATORVASTATIN 40 MG TABLET: 40 | 90 days supply | Qty: 90 | Fill #0

## 2017-08-21 DIAGNOSIS — J3081 Allergic rhinitis due to animal (cat) (dog) hair and dander: Secondary | ICD-10-CM | POA: Diagnosis not present

## 2017-08-21 DIAGNOSIS — J301 Allergic rhinitis due to pollen: Secondary | ICD-10-CM | POA: Diagnosis not present

## 2017-08-21 DIAGNOSIS — J3089 Other allergic rhinitis: Secondary | ICD-10-CM | POA: Diagnosis not present

## 2017-08-27 DIAGNOSIS — J3089 Other allergic rhinitis: Secondary | ICD-10-CM | POA: Diagnosis not present

## 2017-08-27 DIAGNOSIS — J301 Allergic rhinitis due to pollen: Secondary | ICD-10-CM | POA: Diagnosis not present

## 2017-08-27 DIAGNOSIS — J3081 Allergic rhinitis due to animal (cat) (dog) hair and dander: Secondary | ICD-10-CM | POA: Diagnosis not present

## 2017-09-03 DIAGNOSIS — G4733 Obstructive sleep apnea (adult) (pediatric): Secondary | ICD-10-CM | POA: Diagnosis not present

## 2017-09-04 DIAGNOSIS — J301 Allergic rhinitis due to pollen: Secondary | ICD-10-CM | POA: Diagnosis not present

## 2017-09-04 DIAGNOSIS — J3081 Allergic rhinitis due to animal (cat) (dog) hair and dander: Secondary | ICD-10-CM | POA: Diagnosis not present

## 2017-09-04 DIAGNOSIS — J3089 Other allergic rhinitis: Secondary | ICD-10-CM | POA: Diagnosis not present

## 2017-09-11 DIAGNOSIS — J3089 Other allergic rhinitis: Secondary | ICD-10-CM | POA: Diagnosis not present

## 2017-09-11 DIAGNOSIS — J301 Allergic rhinitis due to pollen: Secondary | ICD-10-CM | POA: Diagnosis not present

## 2017-09-11 DIAGNOSIS — J3081 Allergic rhinitis due to animal (cat) (dog) hair and dander: Secondary | ICD-10-CM | POA: Diagnosis not present

## 2017-09-12 MED FILL — MONTELUKAST SOD 10 MG TAB: 10 | 30 days supply | Qty: 30 | Fill #0

## 2017-09-12 MED FILL — LEVOCETIRIZINE 5 MG TABLET: 5 | 30 days supply | Qty: 30 | Fill #0

## 2017-09-15 DIAGNOSIS — J3081 Allergic rhinitis due to animal (cat) (dog) hair and dander: Secondary | ICD-10-CM | POA: Diagnosis not present

## 2017-09-15 DIAGNOSIS — J301 Allergic rhinitis due to pollen: Secondary | ICD-10-CM | POA: Diagnosis not present

## 2017-09-16 DIAGNOSIS — Z8249 Family history of ischemic heart disease and other diseases of the circulatory system: Secondary | ICD-10-CM | POA: Diagnosis not present

## 2017-09-16 DIAGNOSIS — E119 Type 2 diabetes mellitus without complications: Secondary | ICD-10-CM | POA: Diagnosis not present

## 2017-09-16 DIAGNOSIS — Z79899 Other long term (current) drug therapy: Secondary | ICD-10-CM | POA: Diagnosis not present

## 2017-09-16 DIAGNOSIS — E282 Polycystic ovarian syndrome: Secondary | ICD-10-CM | POA: Diagnosis not present

## 2017-09-16 DIAGNOSIS — J3089 Other allergic rhinitis: Secondary | ICD-10-CM | POA: Diagnosis not present

## 2017-09-16 DIAGNOSIS — Z7984 Long term (current) use of oral hypoglycemic drugs: Secondary | ICD-10-CM | POA: Diagnosis not present

## 2017-09-16 DIAGNOSIS — Z87442 Personal history of urinary calculi: Secondary | ICD-10-CM | POA: Diagnosis not present

## 2017-09-16 MED FILL — METOPROLOL TARTRATE 100 MG: 100 | 90 days supply | Qty: 180 | Fill #2

## 2017-09-22 ENCOUNTER — Encounter (HOSPITAL_BASED_OUTPATIENT_CLINIC_OR_DEPARTMENT_OTHER): Payer: Self-pay | Admitting: *Deleted

## 2017-09-22 ENCOUNTER — Other Ambulatory Visit: Payer: Self-pay

## 2017-09-24 ENCOUNTER — Encounter (HOSPITAL_BASED_OUTPATIENT_CLINIC_OR_DEPARTMENT_OTHER)
Admission: RE | Admit: 2017-09-24 | Discharge: 2017-09-24 | Disposition: A | Discharge status: Home / Self Care | Payer: 59 | Source: Ambulatory Visit | Attending: General Surgery | Admitting: General Surgery

## 2017-09-24 DIAGNOSIS — I10 Essential (primary) hypertension: Secondary | ICD-10-CM | POA: Diagnosis not present

## 2017-09-24 DIAGNOSIS — R928 Other abnormal and inconclusive findings on diagnostic imaging of breast: Secondary | ICD-10-CM | POA: Diagnosis present

## 2017-09-24 DIAGNOSIS — N6092 Unspecified benign mammary dysplasia of left breast: Secondary | ICD-10-CM | POA: Diagnosis not present

## 2017-09-24 DIAGNOSIS — F329 Major depressive disorder, single episode, unspecified: Secondary | ICD-10-CM | POA: Diagnosis not present

## 2017-09-24 DIAGNOSIS — Z79899 Other long term (current) drug therapy: Secondary | ICD-10-CM | POA: Diagnosis not present

## 2017-09-24 DIAGNOSIS — Z7984 Long term (current) use of oral hypoglycemic drugs: Secondary | ICD-10-CM | POA: Diagnosis not present

## 2017-09-24 DIAGNOSIS — Z793 Long term (current) use of hormonal contraceptives: Secondary | ICD-10-CM | POA: Diagnosis not present

## 2017-09-24 DIAGNOSIS — G4730 Sleep apnea, unspecified: Secondary | ICD-10-CM | POA: Diagnosis not present

## 2017-09-24 DIAGNOSIS — E7800 Pure hypercholesterolemia, unspecified: Secondary | ICD-10-CM | POA: Diagnosis not present

## 2017-09-24 DIAGNOSIS — E119 Type 2 diabetes mellitus without complications: Secondary | ICD-10-CM | POA: Diagnosis not present

## 2017-09-24 LAB — CBC WITH DIFFERENTIAL/PLATELET
Abs Immature Granulocytes: 0 10*3/uL (ref 0.0–0.1)
Basophils Absolute: 0 10*3/uL (ref 0.0–0.1)
Basophils Relative: 0 %
Eosinophils Absolute: 0.2 10*3/uL (ref 0.0–0.7)
Eosinophils Relative: 3 %
HCT: 49.5 % — ABNORMAL HIGH (ref 36.0–46.0)
Hemoglobin: 15.8 g/dL — ABNORMAL HIGH (ref 12.0–15.0)
Immature Granulocytes: 0 %
Lymphocytes Relative: 25 %
Lymphs Abs: 2.1 10*3/uL (ref 0.7–4.0)
MCH: 30.4 pg (ref 26.0–34.0)
MCHC: 31.9 g/dL (ref 30.0–36.0)
MCV: 95.4 fL (ref 78.0–100.0)
Monocytes Absolute: 0.6 10*3/uL (ref 0.1–1.0)
Monocytes Relative: 7 %
Neutro Abs: 5.4 10*3/uL (ref 1.7–7.7)
Neutrophils Relative %: 65 %
Platelets: 313 10*3/uL (ref 150–400)
RBC: 5.19 MIL/uL — ABNORMAL HIGH (ref 3.87–5.11)
RDW: 12.9 % (ref 11.5–15.5)
WBC: 8.3 10*3/uL (ref 4.0–10.5)

## 2017-09-24 LAB — COMPREHENSIVE METABOLIC PANEL
ALT: 60 U/L — ABNORMAL HIGH (ref 0–44)
AST: 38 U/L (ref 15–41)
Albumin: 3.7 g/dL (ref 3.5–5.0)
Alkaline Phosphatase: 71 U/L (ref 38–126)
Anion gap: 8 (ref 5–15)
BUN: 11 mg/dL (ref 6–20)
CO2: 24 mmol/L (ref 22–32)
Calcium: 9.5 mg/dL (ref 8.9–10.3)
Chloride: 107 mmol/L (ref 98–111)
Creatinine, Ser: 0.93 mg/dL (ref 0.44–1.00)
GFR calc Af Amer: >60 mL/min (ref >60)
GFR calc non Af Amer: >60 mL/min (ref >60)
Glucose, Bld: 165 mg/dL — ABNORMAL HIGH (ref 70–99)
Potassium: 4.3 mmol/L (ref 3.5–5.1)
Sodium: 139 mmol/L (ref 135–145)
Total Bilirubin: 1.2 mg/dL (ref 0.3–1.2)
Total Protein: 6.5 g/dL (ref 6.5–8.1)

## 2017-09-24 NOTE — Progress Notes (Signed)
Ensure pre surgery drink given with instructions to complete by 0500 dos, pt verbalized understanding. 

## 2017-09-25 ENCOUNTER — Ambulatory Visit
Admission: RE | Admit: 2017-09-25 | Discharge: 2017-09-25 | Disposition: A | Discharge status: Home / Self Care | Payer: 59 | Source: Ambulatory Visit | Attending: General Surgery | Admitting: General Surgery

## 2017-09-25 DIAGNOSIS — J301 Allergic rhinitis due to pollen: Secondary | ICD-10-CM | POA: Diagnosis not present

## 2017-09-25 DIAGNOSIS — J3089 Other allergic rhinitis: Secondary | ICD-10-CM | POA: Diagnosis not present

## 2017-09-25 DIAGNOSIS — R928 Other abnormal and inconclusive findings on diagnostic imaging of breast: Secondary | ICD-10-CM | POA: Diagnosis not present

## 2017-09-25 DIAGNOSIS — J3081 Allergic rhinitis due to animal (cat) (dog) hair and dander: Secondary | ICD-10-CM | POA: Diagnosis not present

## 2017-09-25 NOTE — H&P (Signed)
Nicole Khan Location: [facility] Patient #: [id number] DOB: [birth year] Married / Language: English / Race: White Female      History of Present Illness  The patient is a 52 year old female who presents with a complaint of abnormal mammogram left breast. This is a 52 year old female, referred by Nicole Khan at [facility] for a discordant biopsy of the left breast at 12 o'clock position. Nicole Khan is her PCP. Her husband is with her throughout the counter.       She has no prior history of breast problems. Recent screening mammogram showing focal area of distortion in the superior left breast. Image guided biopsy showed benign breast tissue and this was felt to be discordant. Ultrasound of the axilla was negative. Dr. Franki Khan recommended excision.     Comorbidities include BMI 36, non-insulin-dependent diabetes, sleep apnea, hypertension. She's had a C-section, laparoscopic cholecystectomy, and laparoscopic right nephrectomy.  Family history reveals mother was [place] and is deceased. Mother had hypertension, aneurysm, DVT. Father living had kidney cancer and hypertension. Sister living had thyroid cancer. No breast or ovarian cancer Social history reveals she is married and [place] her husband and 2 children. She is a housewife. Denies tobacco.      I discussed the the risks of surgical intervention and the benefits of that. I discussed the risk and benefit of observation. Excision was recommended due to the 5% or so risk of cancer here. She knows that it is hard to know for sure but that there is no better test. She and her husband want to go home and think about this. They may want to simply be followed closely. She had a hard time after her ovarian surgery. They worry about her sleep apnea. I told him if that was their decision that I would repeat a 3-D mammography of the left breast and left breast ultrasound in 6 months and see me at  that time to ensure stability over time for a few years if they change their mind I did discuss left breast lipectomy radioactive seed localization and discussed the techniques and risks of that.       Unless they change their mind, I will see her in 6 months after they get imaging studies of the left breast  Addendum Note The patient called today and stated that she had decided to schedule her left breast surgery Orders and posting sheet were completed for left breast lumpectomy with radioactive seed localization Diagnosis: abnormal mammogram left breast with discordant biopsy:   Past Surgical History  Breast Biopsy  Left. Cesarean Section - 1  Colon Polyp Removal - Colonoscopy  Foot Surgery  Right. Gallbladder Surgery - Laparoscopic   Diagnostic Studies History Colonoscopy  within last year Mammogram  within last year Pap Smear  1-5 years ago  Allergies  No Known Allergies  Allergies Reconciled   Medication History  Atorvastatin Calcium (40MG  Tablet, Oral) Active. BuPROPion HCl ER (XL) (300MG  Tablet ER 24HR, Oral) Active. Citalopram Hydrobromide (20MG  Tablet, Oral) Active. Metoprolol Tartrate (100MG  Tablet, Oral) Active. GaviLyte-G (236GM For Solution, Oral) Active. Levocetirizine Dihydrochloride (5MG  Tablet, Oral) Active. Montelukast Sodium (10MG  Tablet, Oral) Active. Norethindrone (0.35MG  Tablet, Oral) Active. Spironolactone (100MG  Tablet, Oral) Active. Synjardy XR (12.5-1000MG  Tablet ER 24HR, Oral) Active. Vitamin D (Ergocalciferol) (50000UNIT Capsule, Oral) Active. Medications Reconciled  Social History  Alcohol use  Occasional alcohol use. Caffeine use  Coffee. Tobacco use  Never smoker.  Family History  Alcohol Abuse  Father.  Arthritis  Mother, Sister. Cerebrovascular Accident  Brother, Mother. Depression  Brother, Daughter, Father. Hypertension  Brother, Father, Mother, Sister. Migraine Headache  Brother, Father. Thyroid  problems  Sister.  Pregnancy / Birth History Age at menarche  34 years. Contraceptive History  Oral contraceptives. Gravida  4 Irregular periods  Length (months) of breastfeeding  3-6 Maternal age  51-20 Para  2  Other Problems  Anxiety Disorder  Asthma  Back Pain  Cholelithiasis  Depression  Diabetes Mellitus  Diverticulosis  Gastric Ulcer  General anesthesia - complications  High blood pressure  Hypercholesterolemia  Kidney Stone  Migraine Headache  Oophorectomy  Right. Sleep Apnea     Review of Systems  General Not Present- Appetite Loss, Chills, Fatigue, Fever, Night Sweats, Weight Gain and Weight Loss. Skin Not Present- Change in Wart/Mole, Dryness, Hives, Jaundice, New Lesions, Non-Healing Wounds, Rash and Ulcer. HEENT Present- Hearing Loss, Seasonal Allergies and Wears glasses/contact lenses. Not Present- Earache, Hoarseness, Nose Bleed, Oral Ulcers, Ringing in the Ears, Sinus Pain, Sore Throat, Visual Disturbances and Yellow Eyes. Breast Present- Breast Mass. Not Present- Breast Pain, Nipple Discharge and Skin Changes. Gastrointestinal Not Present- Abdominal Pain, Bloating, Bloody Stool, Change in Bowel Habits, Chronic diarrhea, Constipation, Difficulty Swallowing, Excessive gas, Gets full quickly at meals, Hemorrhoids, Indigestion, Nausea, Rectal Pain and Vomiting. Female Genitourinary Not Present- Frequency, Nocturia, Painful Urination, Pelvic Pain and Urgency. Musculoskeletal Present- Joint Stiffness. Not Present- Back Pain, Joint Pain, Muscle Pain, Muscle Weakness and Swelling of Extremities. Psychiatric Present- Anxiety and Depression. Not Present- Bipolar, Change in Sleep Pattern, Fearful and Frequent crying. Endocrine Present- Hair Changes, Heat Intolerance and Hot flashes. Not Present- Cold Intolerance, Excessive Hunger and New Diabetes. Hematology Not Present- Blood Thinners, Easy Bruising, Excessive bleeding, Gland problems, HIV and  Persistent Infections.  Vitals  Weight: 197.25 lb Height: 62in Body Surface Area: 1.9 m Body Mass Index: 36.08 kg/m  Temp.: 98.46F(Oral)  Pulse: 76 (Regular)  BP: 116/88 (Sitting, Right Arm, Standard)       Physical Exam General Mental Status-Alert. General Appearance-Consistent with stated age. Hydration-Well hydrated. Voice-Normal. Note: BMI 36   Head and Neck Head-normocephalic, atraumatic with no lesions or palpable masses. Trachea-midline. Thyroid Gland Characteristics - normal size and consistency.  Eye Eyeball - Bilateral-Extraocular movements intact. Sclera/Conjunctiva - Bilateral-No scleral icterus.  Chest and Lung Exam Chest and lung exam reveals -quiet, even and easy respiratory effort with no use of accessory muscles and on auscultation, normal breath sounds, no adventitious sounds and normal vocal resonance. Inspection Chest Wall - Normal. Back - normal.  Breast Note: Breast are relatively small. Some ecchymoses centrally on the left. A little bit of thickening which I think his hematoma. No dominant mass in either breast. No other skin changes. No nipple discharge. No axillary adenopathy.   Cardiovascular Cardiovascular examination reveals -normal heart sounds, regular rate and rhythm with no murmurs and normal pedal pulses bilaterally.  Abdomen Inspection Inspection of the abdomen reveals - No Hernias. Skin - Scar - no surgical scars. Palpation/Percussion Palpation and Percussion of the abdomen reveal - Soft, Non Tender, No Rebound tenderness, No Rigidity (guarding) and No hepatosplenomegaly. Auscultation Auscultation of the abdomen reveals - Bowel sounds normal.  Neurologic Neurologic evaluation reveals -alert and oriented x 3 with no impairment of recent or remote memory. Mental Status-Normal.  Musculoskeletal Normal Exam - Left-Upper Extremity Strength Normal and Lower Extremity Strength  Normal. Normal Exam - Right-Upper Extremity Strength Normal and Lower Extremity Strength Normal.  Lymphatic Head & Neck  General Head &  Neck Lymphatics: Bilateral - Description - Normal. Axillary  General Axillary Region: Bilateral - Description - Normal. Tenderness - Non Tender. Femoral & Inguinal  Generalized Femoral & Inguinal Lymphatics: Bilateral - Description - Normal. Tenderness - Non Tender.    Assessment & Plan ABNORMALITY OF LEFT BREAST ON SCREENING MAMMOGRAM (R92.8).   Your recent imaging studies and biopsy showed an small area of distortion at the 12 o'clock position of the left breast Image guided biopsy shows normal breast tissue Ultrasound of her axilla shows normal lymph nodes  The radiologist feels that the biopsy is discordant, in other words they are not comfortable that they biopsied the tissue properly and that there is higher risk for cancer your exam is unremarkable except for a little bit of thickening secondary to bleeding  You probably do not have cancer The risk of early cancer in this setting is somewhere between 5 and 10% Excision of this area is recommended I discussed the indications, techniques, and risks of lumpectomy with radioactive seed in detail. you and your husband state that you wanted to go home and think about this before making a decision That is perfectly fine you stated that you simply might want to be followed closely  Left breast biopsy scheduled June 27 at patient request.  TYPE 2 DIABETES MELLITUS TREATED WITHOUT INSULIN (E11.9) SLEEP APNEA IN ADULT (G47.30) HYPERTENSION, BENIGN (I10) POLYCYSTIC OVARY (E28.2) HISTORY OF RIGHT OOPHORECTOMY (Z90.721) HISTORY OF C-SECTION (Y69.485) HISTORY OF LAPAROSCOPIC CHOLECYSTECTOMY (Z90.49) BMI 36.0-36.9,ADULT (Z68.36)    Edsel Petrin. Dalbert Batman, M.D., [facility] General and Minimally invasive Surgery Breast and Colorectal Surgery Office:    [phone] Pager:   [phone]

## 2017-09-26 ENCOUNTER — Ambulatory Visit
Admission: RE | Admit: 2017-09-26 | Discharge: 2017-09-26 | Disposition: A | Discharge status: Home / Self Care | Payer: 59 | Source: Ambulatory Visit | Attending: General Surgery | Admitting: General Surgery

## 2017-09-26 ENCOUNTER — Encounter (HOSPITAL_BASED_OUTPATIENT_CLINIC_OR_DEPARTMENT_OTHER)
Admission: RE | Disposition: A | Discharge status: Home / Self Care | Payer: Self-pay | Source: Ambulatory Visit | Attending: General Surgery

## 2017-09-26 ENCOUNTER — Ambulatory Visit (HOSPITAL_BASED_OUTPATIENT_CLINIC_OR_DEPARTMENT_OTHER): Payer: 59 | Admitting: Certified Registered"

## 2017-09-26 ENCOUNTER — Ambulatory Visit (HOSPITAL_BASED_OUTPATIENT_CLINIC_OR_DEPARTMENT_OTHER)
Admission: RE | Admit: 2017-09-26 | Discharge: 2017-09-26 | Disposition: A | Discharge status: Home / Self Care | Payer: 59 | Source: Ambulatory Visit | Attending: General Surgery | Admitting: General Surgery

## 2017-09-26 ENCOUNTER — Encounter (HOSPITAL_BASED_OUTPATIENT_CLINIC_OR_DEPARTMENT_OTHER): Payer: Self-pay | Admitting: Certified Registered"

## 2017-09-26 ENCOUNTER — Telehealth: Payer: Self-pay | Admitting: Certified Nurse Midwife

## 2017-09-26 ENCOUNTER — Other Ambulatory Visit: Payer: Self-pay

## 2017-09-26 DIAGNOSIS — F329 Major depressive disorder, single episode, unspecified: Secondary | ICD-10-CM | POA: Insufficient documentation

## 2017-09-26 DIAGNOSIS — I10 Essential (primary) hypertension: Secondary | ICD-10-CM | POA: Diagnosis not present

## 2017-09-26 DIAGNOSIS — E7800 Pure hypercholesterolemia, unspecified: Secondary | ICD-10-CM | POA: Insufficient documentation

## 2017-09-26 DIAGNOSIS — Z79899 Other long term (current) drug therapy: Secondary | ICD-10-CM | POA: Insufficient documentation

## 2017-09-26 DIAGNOSIS — N6082 Other benign mammary dysplasias of left breast: Secondary | ICD-10-CM | POA: Diagnosis not present

## 2017-09-26 DIAGNOSIS — R928 Other abnormal and inconclusive findings on diagnostic imaging of breast: Secondary | ICD-10-CM

## 2017-09-26 DIAGNOSIS — G4730 Sleep apnea, unspecified: Secondary | ICD-10-CM | POA: Diagnosis not present

## 2017-09-26 DIAGNOSIS — N6092 Unspecified benign mammary dysplasia of left breast: Secondary | ICD-10-CM | POA: Insufficient documentation

## 2017-09-26 DIAGNOSIS — Z793 Long term (current) use of hormonal contraceptives: Secondary | ICD-10-CM | POA: Insufficient documentation

## 2017-09-26 DIAGNOSIS — E119 Type 2 diabetes mellitus without complications: Secondary | ICD-10-CM | POA: Diagnosis not present

## 2017-09-26 DIAGNOSIS — Z7984 Long term (current) use of oral hypoglycemic drugs: Secondary | ICD-10-CM | POA: Insufficient documentation

## 2017-09-26 HISTORY — DX: Other abnormal and inconclusive findings on diagnostic imaging of breast: R92.8

## 2017-09-26 HISTORY — DX: Sleep apnea, unspecified: G47.30

## 2017-09-26 HISTORY — PX: BREAST LUMPECTOMY WITH RADIOACTIVE SEED LOCALIZATION: SHX6424

## 2017-09-26 HISTORY — DX: Acquired absence of other genital organ(s): Z90.79

## 2017-09-26 LAB — GLUCOSE, CAPILLARY
Glucose-Capillary: 172 mg/dL — ABNORMAL HIGH (ref 70–99)
Glucose-Capillary: 133 mg/dL — ABNORMAL HIGH (ref 70–99)

## 2017-09-26 SURGERY — BREAST LUMPECTOMY WITH RADIOACTIVE SEED LOCALIZATION
Anesthesia: General | Site: Breast | Laterality: Left

## 2017-09-26 MED ORDER — LIDOCAINE-EPINEPHRINE 1 %-1:100000 IJ SOLN
INTRAMUSCULAR | Status: AC
Start: 2017-09-26 — End: ?
  Filled 2017-09-26: qty 1

## 2017-09-26 MED ORDER — BUPIVACAINE-EPINEPHRINE (PF) 0.5% -1:200000 IJ SOLN
INTRAMUSCULAR | Status: AC
  Filled 2017-09-26: qty 30

## 2017-09-26 MED ORDER — GABAPENTIN 300 MG PO CAPS
300.0000 mg | ORAL_CAPSULE | ORAL | Status: AC
  Administered 2017-09-26: 300 mg via ORAL

## 2017-09-26 MED ORDER — LIDOCAINE HCL (CARDIAC) PF 100 MG/5ML IV SOSY
PREFILLED_SYRINGE | INTRAVENOUS | Status: DC | PRN
  Administered 2017-09-26: 60 mg via INTRAVENOUS

## 2017-09-26 MED ORDER — BUPIVACAINE HCL (PF) 0.25 % IJ SOLN
INTRAMUSCULAR | Status: AC
  Filled 2017-09-26: qty 30

## 2017-09-26 MED ORDER — BUPIVACAINE-EPINEPHRINE (PF) 0.5% -1:200000 IJ SOLN
INTRAMUSCULAR | Status: DC | PRN
  Administered 2017-09-26: 10 mL

## 2017-09-26 MED ORDER — CELECOXIB 200 MG PO CAPS
200.0000 mg | ORAL_CAPSULE | ORAL | Status: AC
  Administered 2017-09-26: 200 mg via ORAL

## 2017-09-26 MED ORDER — ACETAMINOPHEN 500 MG PO TABS
ORAL_TABLET | ORAL | Status: AC
Start: 2017-09-26 — End: ?
  Filled 2017-09-26: qty 2

## 2017-09-26 MED ORDER — PROPOFOL 500 MG/50ML IV EMUL
INTRAVENOUS | Status: DC | PRN
  Administered 2017-09-26: 75 ug/kg/min via INTRAVENOUS

## 2017-09-26 MED ORDER — CELECOXIB 200 MG PO CAPS
ORAL_CAPSULE | ORAL | Status: AC
  Filled 2017-09-26: qty 1

## 2017-09-26 MED ORDER — MIDAZOLAM HCL 2 MG/2ML IJ SOLN
1.0000 mg | INTRAMUSCULAR | Status: DC | PRN
  Administered 2017-09-26: 2 mg via INTRAVENOUS

## 2017-09-26 MED ORDER — FENTANYL CITRATE (PF) 100 MCG/2ML IJ SOLN
INTRAMUSCULAR | Status: AC
  Filled 2017-09-26: qty 2

## 2017-09-26 MED ORDER — EPINEPHRINE 30 MG/30ML IJ SOLN
INTRAMUSCULAR | Status: AC
Start: 2017-09-26 — End: ?
  Filled 2017-09-26: qty 1

## 2017-09-26 MED ORDER — CHLORHEXIDINE GLUCONATE CLOTH 2 % EX PADS: 6.0000 | MEDICATED_PAD | Freq: Once | CUTANEOUS | Status: DC

## 2017-09-26 MED ORDER — BUPIVACAINE-EPINEPHRINE (PF) 0.25% -1:200000 IJ SOLN
INTRAMUSCULAR | Status: AC
  Filled 2017-09-26: qty 30

## 2017-09-26 MED ORDER — FENTANYL CITRATE (PF) 100 MCG/2ML IJ SOLN: 25.0000 ug | INTRAMUSCULAR | Status: DC | PRN

## 2017-09-26 MED ORDER — ONDANSETRON HCL 4 MG/2ML IJ SOLN
INTRAMUSCULAR | Status: DC | PRN
  Administered 2017-09-26: 4 mg via INTRAVENOUS

## 2017-09-26 MED ORDER — LACTATED RINGERS IV SOLN
INTRAVENOUS | Status: DC
  Administered 2017-09-26: 08:00:00 via INTRAVENOUS

## 2017-09-26 MED ORDER — OXYCODONE HCL 5 MG/5ML PO SOLN: 5.0000 mg | Freq: Once | ORAL | Status: DC | PRN

## 2017-09-26 MED ORDER — GABAPENTIN 300 MG PO CAPS
ORAL_CAPSULE | ORAL | Status: AC
  Filled 2017-09-26: qty 1

## 2017-09-26 MED ORDER — HYDROCODONE-ACETAMINOPHEN 5-325 MG PO TABS: 1.0000 | ORAL_TABLET | Freq: Four times a day (QID) | ORAL | 0 refills | Status: DC | PRN

## 2017-09-26 MED ORDER — DEXAMETHASONE SODIUM PHOSPHATE 4 MG/ML IJ SOLN
INTRAMUSCULAR | Status: DC | PRN
  Administered 2017-09-26: 4 mg via INTRAVENOUS

## 2017-09-26 MED ORDER — KETOROLAC TROMETHAMINE 30 MG/ML IJ SOLN: 30.0000 mg | Freq: Once | INTRAMUSCULAR | Status: DC | PRN

## 2017-09-26 MED ORDER — EPHEDRINE SULFATE 50 MG/ML IJ SOLN
INTRAMUSCULAR | Status: DC | PRN
  Administered 2017-09-26: 10 mg via INTRAVENOUS

## 2017-09-26 MED ORDER — CEFAZOLIN SODIUM-DEXTROSE 2-4 GM/100ML-% IV SOLN
INTRAVENOUS | Status: AC
  Filled 2017-09-26: qty 100

## 2017-09-26 MED ORDER — SCOPOLAMINE 1 MG/3DAYS TD PT72
MEDICATED_PATCH | TRANSDERMAL | Status: AC
  Filled 2017-09-26: qty 1

## 2017-09-26 MED ORDER — FENTANYL CITRATE (PF) 100 MCG/2ML IJ SOLN
50.0000 ug | INTRAMUSCULAR | Status: DC | PRN
  Administered 2017-09-26: 100 ug via INTRAVENOUS

## 2017-09-26 MED ORDER — MIDAZOLAM HCL 2 MG/2ML IJ SOLN
INTRAMUSCULAR | Status: AC
  Filled 2017-09-26: qty 2

## 2017-09-26 MED ORDER — OXYCODONE HCL 5 MG PO TABS: 5.0000 mg | ORAL_TABLET | Freq: Once | ORAL | Status: DC | PRN

## 2017-09-26 MED ORDER — SCOPOLAMINE 1 MG/3DAYS TD PT72
1.0000 | MEDICATED_PATCH | Freq: Once | TRANSDERMAL | Status: DC | PRN
  Administered 2017-09-26: 1.5 mg via TRANSDERMAL

## 2017-09-26 MED ORDER — ACETAMINOPHEN 500 MG PO TABS
1000.0000 mg | ORAL_TABLET | ORAL | Status: AC
  Administered 2017-09-26: 1000 mg via ORAL

## 2017-09-26 MED ORDER — PROPOFOL 10 MG/ML IV BOLUS
INTRAVENOUS | Status: DC | PRN
  Administered 2017-09-26: 200 mg via INTRAVENOUS

## 2017-09-26 MED ORDER — PROMETHAZINE HCL 25 MG/ML IJ SOLN: 6.2500 mg | INTRAMUSCULAR | Status: DC | PRN

## 2017-09-26 MED ORDER — LIDOCAINE HCL (PF) 1 % IJ SOLN
INTRAMUSCULAR | Status: AC
  Filled 2017-09-26: qty 60

## 2017-09-26 MED ORDER — CEFAZOLIN SODIUM-DEXTROSE 2-4 GM/100ML-% IV SOLN
2.0000 g | INTRAVENOUS | Status: AC
  Administered 2017-09-26: 2 g via INTRAVENOUS

## 2017-09-26 MED FILL — HYDROCODON-APAP 5-325: 5-325 | 3 days supply | Qty: 20 | Fill #0

## 2017-09-26 SURGICAL SUPPLY — 61 items
APPLIER CLIP 9.375 MED OPEN (MISCELLANEOUS) ×2
BENZOIN TINCTURE PRP APPL 2/3 (GAUZE/BANDAGES/DRESSINGS) IMPLANT
BINDER BREAST LRG (GAUZE/BANDAGES/DRESSINGS) IMPLANT
BINDER BREAST MEDIUM (GAUZE/BANDAGES/DRESSINGS) IMPLANT
BINDER BREAST XLRG (GAUZE/BANDAGES/DRESSINGS) IMPLANT
BINDER BREAST XXLRG (GAUZE/BANDAGES/DRESSINGS) ×2 IMPLANT
BLADE HEX COATED 2.75 (ELECTRODE) ×2 IMPLANT
BLADE SURG 10 STRL SS (BLADE) IMPLANT
BLADE SURG 15 STRL LF DISP TIS (BLADE) ×1 IMPLANT
BLADE SURG 15 STRL SS (BLADE) ×1
CANISTER SUC SOCK COL 7IN (MISCELLANEOUS) IMPLANT
CANISTER SUCT 1200ML W/VALVE (MISCELLANEOUS) ×2 IMPLANT
CHLORAPREP W/TINT 26ML (MISCELLANEOUS) ×2 IMPLANT
CLIP APPLIE 9.375 MED OPEN (MISCELLANEOUS) ×1 IMPLANT
COVER BACK TABLE 60X90IN (DRAPES) ×2 IMPLANT
COVER MAYO STAND STRL (DRAPES) ×2 IMPLANT
COVER PROBE W GEL 5X96 (DRAPES) ×2 IMPLANT
DECANTER SPIKE VIAL GLASS SM (MISCELLANEOUS) IMPLANT
DERMABOND ADVANCED (GAUZE/BANDAGES/DRESSINGS) ×1
DERMABOND ADVANCED .7 DNX12 (GAUZE/BANDAGES/DRESSINGS) ×1 IMPLANT
DEVICE DUBIN W/COMP PLATE 8390 (MISCELLANEOUS) ×2 IMPLANT
DRAPE LAPAROSCOPIC ABDOMINAL (DRAPES) ×2 IMPLANT
DRAPE UTILITY XL STRL (DRAPES) ×2 IMPLANT
DRSG PAD ABDOMINAL 8X10 ST (GAUZE/BANDAGES/DRESSINGS) IMPLANT
ELECT REM PT RETURN 9FT ADLT (ELECTROSURGICAL) ×2
ELECTRODE REM PT RTRN 9FT ADLT (ELECTROSURGICAL) ×1 IMPLANT
GAUZE SPONGE 4X4 12PLY STRL LF (GAUZE/BANDAGES/DRESSINGS) ×2 IMPLANT
GLOVE BIO SURGEON STRL SZ 6.5 (GLOVE) ×2 IMPLANT
GLOVE BIOGEL PI IND STRL 6.5 (GLOVE) ×1 IMPLANT
GLOVE BIOGEL PI INDICATOR 6.5 (GLOVE) ×1
GLOVE EUDERMIC 7 POWDERFREE (GLOVE) ×2 IMPLANT
GLOVE EXAM NITRILE MD LF STRL (GLOVE) ×2 IMPLANT
GOWN STRL REUS W/ TWL LRG LVL3 (GOWN DISPOSABLE) ×1 IMPLANT
GOWN STRL REUS W/ TWL XL LVL3 (GOWN DISPOSABLE) ×1 IMPLANT
GOWN STRL REUS W/TWL LRG LVL3 (GOWN DISPOSABLE) ×1
GOWN STRL REUS W/TWL XL LVL3 (GOWN DISPOSABLE) ×1
ILLUMINATOR WAVEGUIDE N/F (MISCELLANEOUS) IMPLANT
KIT MARKER MARGIN INK (KITS) ×2 IMPLANT
LIGHT WAVEGUIDE WIDE FLAT (MISCELLANEOUS) IMPLANT
NEEDLE HYPO 25X1 1.5 SAFETY (NEEDLE) ×2 IMPLANT
NS IRRIG 1000ML POUR BTL (IV SOLUTION) ×2 IMPLANT
PACK BASIN DAY SURGERY FS (CUSTOM PROCEDURE TRAY) ×2 IMPLANT
PENCIL BUTTON HOLSTER BLD 10FT (ELECTRODE) ×2 IMPLANT
SHEET MEDIUM DRAPE 40X70 STRL (DRAPES) IMPLANT
SLEEVE SCD COMPRESS KNEE MED (MISCELLANEOUS) ×2 IMPLANT
SPONGE LAP 18X18 RF (DISPOSABLE) IMPLANT
SPONGE LAP 4X18 RFD (DISPOSABLE) ×2 IMPLANT
STRIP CLOSURE SKIN 1/2X4 (GAUZE/BANDAGES/DRESSINGS) IMPLANT
SUT ETHILON 3 0 FSL (SUTURE) IMPLANT
SUT MNCRL AB 4-0 PS2 18 (SUTURE) ×2 IMPLANT
SUT SILK 2 0 SH (SUTURE) ×2 IMPLANT
SUT VIC AB 2-0 CT1 27 (SUTURE)
SUT VIC AB 2-0 CT1 TAPERPNT 27 (SUTURE) IMPLANT
SUT VIC AB 3-0 SH 27 (SUTURE)
SUT VIC AB 3-0 SH 27X BRD (SUTURE) IMPLANT
SUT VICRYL 3-0 CR8 SH (SUTURE) ×2 IMPLANT
SYR 10ML LL (SYRINGE) ×2 IMPLANT
TOWEL GREEN STERILE FF (TOWEL DISPOSABLE) ×2 IMPLANT
TOWEL OR NON WOVEN STRL DISP B (DISPOSABLE) ×2 IMPLANT
TUBE CONNECTING 20X1/4 (TUBING) ×2 IMPLANT
YANKAUER SUCT BULB TIP NO VENT (SUCTIONS) ×2 IMPLANT

## 2017-09-26 NOTE — Discharge Instructions (Signed)
[facility] °Office Phone Number [phone] ° °BREAST BIOPSY/ PARTIAL MASTECTOMY: POST OP INSTRUCTIONS ° °Always review your discharge instruction sheet given to you by the facility where your surgery was performed. ° °IF YOU HAVE DISABILITY OR FAMILY LEAVE FORMS, YOU MUST BRING THEM TO THE OFFICE FOR PROCESSING.  DO NOT GIVE THEM TO YOUR DOCTOR. ° °1. A prescription for pain medication may be given to you upon discharge.  Take your pain medication as prescribed, if needed.  If narcotic pain medicine is not needed, then you may take acetaminophen (Tylenol) or ibuprofen (Advil) as needed. °2. Take your usually prescribed medications unless otherwise directed °3. If you need a refill on your pain medication, please contact your pharmacy.  They will contact our office to request authorization.  Prescriptions will not be filled after 5pm or on week-ends. °4. You should eat very light the first 24 hours after surgery, such as soup, crackers, pudding, etc.  Resume your normal diet the day after surgery. °5. Most patients will experience some swelling and bruising in the breast.  Ice packs and a good support bra will help.  Swelling and bruising can take several days to resolve.  °6. It is common to experience some constipation if taking pain medication after surgery.  Increasing fluid intake and taking a stool softener will usually help or prevent this problem from occurring.  A mild laxative (Milk of Magnesia or Miralax) should be taken according to package directions if there are no bowel movements after 48 hours. °7. Unless discharge instructions indicate otherwise, you may remove your bandages 24-48 hours after surgery, and you may shower at that time.  You may have steri-strips (small skin tapes) in place directly over the incision.  These strips should be left on the skin for 7-10 days.  If your surgeon used skin glue on the incision, you may shower in 24 hours.  The glue will flake off over the  next 2-3 weeks.  Any sutures or staples will be removed at the office during your follow-up visit. °8. ACTIVITIES:  You may resume regular daily activities (gradually increasing) beginning the next day.  Wearing a good support bra or sports bra minimizes pain and swelling.  You may have sexual intercourse when it is comfortable. °a. You may drive when you no longer are taking prescription pain medication, you can comfortably wear a seatbelt, and you can safely maneuver your car and apply brakes. °b. RETURN TO WORK:  ______________________________________________________________________________________ °9. You should see your doctor in the office for a follow-up appointment approximately two weeks after your surgery.  Your doctor’s nurse will typically make your follow-up appointment when she calls you with your pathology report.  Expect your pathology report 2-3 business days after your surgery.  You may call to check if you do not hear from us after three days. °10. OTHER INSTRUCTIONS: _______________________________________________________________________________________________ _____________________________________________________________________________________________________________________________________ °_____________________________________________________________________________________________________________________________________ °_____________________________________________________________________________________________________________________________________ ° °WHEN TO CALL YOUR DOCTOR: °1. Fever over 101.0 °2. Nausea and/or vomiting. °3. Extreme swelling or bruising. °4. Continued bleeding from incision. °5. Increased pain, redness, or drainage from the incision. ° °The clinic staff is available to answer your questions during regular business hours.  Please don’t hesitate to call and ask to speak to one of the nurses for clinical concerns.  If you have a medical emergency, go to the nearest  emergency room or call 911.  A surgeon from [facility] is always on call at the hospital. ° °For further questions, please visit [email or web address]  ° ° ° ° ° ° °  Post Anesthesia Home Care Instructions ° °Activity: °Get plenty of rest for the remainder of the day. A responsible individual must stay with you for 24 hours following the procedure.  °For the next 24 hours, DO NOT: °-Drive a car °-Operate machinery °-Drink alcoholic beverages °-Take any medication unless instructed by your physician °-Make any legal decisions or sign important papers. ° °Meals: °Start with liquid foods such as gelatin or soup. Progress to regular foods as tolerated. Avoid greasy, spicy, heavy foods. If nausea and/or vomiting occur, drink only clear liquids until the nausea and/or vomiting subsides. Call your physician if vomiting continues. ° °Special Instructions/Symptoms: °Your throat may feel dry or sore from the anesthesia or the breathing tube placed in your throat during surgery. If this causes discomfort, gargle with warm salt water. The discomfort should disappear within 24 hours. ° °If you had a scopolamine patch placed behind your ear for the management of post- operative nausea and/or vomiting: ° °1. The medication in the patch is effective for 72 hours, after which it should be removed.  Wrap patch in a tissue and discard in the trash. Wash hands thoroughly with soap and water. °2. You may remove the patch earlier than 72 hours if you experience unpleasant side effects which may include dry mouth, dizziness or visual disturbances. °3. Avoid touching the patch. Wash your hands with soap and water after contact with the patch. °  ° ° ° ° ° ° ° °Regional Anesthesia Blocks ° °1. Numbness or the inability to move the "blocked" extremity may last from 3-48 hours after placement. The length of time depends on the medication injected and your individual response to the medication. If the numbness is not going  away after 48 hours, call your surgeon. ° °2. The extremity that is blocked will need to be protected until the numbness is gone and the  Strength has returned. Because you cannot feel it, you will need to take extra care to avoid injury. Because it may be weak, you may have difficulty moving it or using it. You may not know what position it is in without looking at it while the block is in effect. ° °3. For blocks in the legs and feet, returning to weight bearing and walking needs to be done carefully. You will need to wait until the numbness is entirely gone and the strength has returned. You should be able to move your leg and foot normally before you try and bear weight or walk. You will need someone to be with you when you first try to ensure you do not fall and possibly risk injury. ° °4. Bruising and tenderness at the needle site are common side effects and will resolve in a few days. ° °5. Persistent numbness or new problems with movement should be communicated to the surgeon or the [facility] ([phone])/ [facility] ([phone]). °

## 2017-09-26 NOTE — Op Note (Signed)
Patient Name:           Nicole Khan   Date of Surgery:        09/26/2017  Pre op Diagnosis:      Abnormal mammogram left breast with discordant biopsy  Post op Diagnosis:    Same  Procedure:                 Left breast lumpectomy with radioactive seed localization and margin assessment  Surgeon:                     Nicole Khan, M.D., FACS  Assistant:                      OR staff  Operative Indications:   . This is a 52 year old female, referred by Nicole Khan at [facility] for a discordant biopsy of the left breast at 12 o'clock position. Nicole Khan is her PCP. Her husband is with her throughout the counter.       She has no prior history of breast problems. Recent screening mammogram showing focal area of distortion in the superior left breast. Image guided biopsy showed benign breast tissue and this was felt to be discordant. Ultrasound of the axilla was negative. Dr. Franki Khan recommended excision.     Comorbidities include BMI 36, non-insulin-dependent diabetes, sleep apnea, hypertension. She's had a C-section, laparoscopic cholecystectomy, and laparoscopic right nephrectomy.  Family history reveals mother was [place] and is deceased. Mother had hypertension, aneurysm, DVT. Father living had kidney cancer and hypertension. Sister living had thyroid cancer. No breast or ovarian cancer      I discussed the the risks of surgical intervention and the benefits of that. I discussed the risk and benefit of observation. Excision was recommended due to the 5% or so risk of cancer here. She knows that it is hard to know for sure but that there is no better test. She and her husband want to go home and think about this. They called back and stated that they wanted to proceed with the excision of this area. :    Operative Findings:       The radioactive seed and marker clip were in the 11:30 position of the right breast.  It was close enough to the areolar margin where I  was able to make a circumareolar incision, hidden scar technique.  The specimen mammogram looked good and contained the seed and the  original titanium biopsy marker.  The margins were inked for assessment.  Procedure in Detail:          Following the induction of general LMA anesthesia the patient's left breast was prepped and draped in a sterile fashion.  Intravenous antibiotics were given.  Surgical timeout was performed.  0.5% Marcaine was used as a local infiltration anesthetic.  Using the neoprobe I identified the area in the left breast.  Maximum signal was at the 11:30 position no more than 2 cm above the areolar margin.  I made a curvilinear incision at the areolar margin.  Lumpectomy was performed using neoprobe and electrocautery.  The specimen was removed and marked with silk sutures and a 6 color ink kit.  Specimen mammogram looked good as described above.  Specimen was sent to pathology where the seed was retrieved.  Hemostasis was excellent with electrocautery.  The wound was irrigated.  I placed 5 metal marker clips in the margins of the lumpectomy cavity.  The breast  tissues were reapproximated in 2 layers with interrupted 3-0 Vicryl and the skin closed with a running subcuticular 4-0 Monocryl and Dermabond.  Dry bandages and a breast binder were placed.  The patient tolerated the procedure well and was taken to PACU in stable condition.  EBL 10 to 15 cc.  Counts correct.  Complications none   Addendum: I logged onto the [employer] and reviewed her prescription medication history     Vedika Dumlao M. Dalbert Khan, M.D., FACS General and Minimally Invasive Surgery Breast and Colorectal Surgery  09/26/2017 8:41 AM

## 2017-09-26 NOTE — Transfer of Care (Signed)
Immediate Anesthesia Transfer of Care Note  Patient: Nicole Khan  Procedure(s) Performed: LEFT BREAST LUMPECTOMY WITH RADIOACTIVE SEED LOCALIZATION ERAS PATHWAY (Left Breast)  Patient Location: PACU  Anesthesia Type:General  Level of Consciousness: awake and patient cooperative  Airway & Oxygen Therapy: Patient Spontanous Breathing and Patient connected to face mask oxygen  Post-op Assessment: Report given to RN and Post -op Vital signs reviewed and stable  Post vital signs: Reviewed and stable  Last Vitals:  Vitals Value Taken Time  BP    Temp    Pulse    Resp    SpO2      Last Pain:  Vitals:   09/26/17 0729  TempSrc: Oral  PainSc: 0-No pain      Patients Stated Pain Goal: 4 (22/56/72 0919)  Complications: No apparent anesthesia complications

## 2017-09-26 NOTE — Anesthesia Postprocedure Evaluation (Signed)
Anesthesia Post Note  Patient: Nicole Khan  Procedure(s) Performed: LEFT BREAST LUMPECTOMY WITH RADIOACTIVE SEED LOCALIZATION ERAS PATHWAY (Left Breast)     Patient location during evaluation: PACU Anesthesia Type: General Level of consciousness: awake and alert Pain management: pain level controlled Vital Signs Assessment: post-procedure vital signs reviewed and stable Respiratory status: spontaneous breathing, nonlabored ventilation, respiratory function stable and patient connected to nasal cannula oxygen Cardiovascular status: blood pressure returned to baseline and stable Postop Assessment: no apparent nausea or vomiting Anesthetic complications: no    Last Vitals:  Vitals:   09/26/17 0845 09/26/17 0900  BP: 92/61 93/60  Pulse: 70 70  Resp: 17 17  Temp:    SpO2: 96% 96%    Last Pain:  Vitals:   09/26/17 0845  TempSrc:   PainSc: Asleep                 Anushree Dorsi S

## 2017-09-26 NOTE — Anesthesia Procedure Notes (Signed)
Procedure Name: LMA Insertion Date/Time: 09/26/2017 7:50 AM Performed by: Signe Colt, CRNA Pre-anesthesia Checklist: Patient identified, Emergency Drugs available, Suction available and Patient being monitored Patient Re-evaluated:Patient Re-evaluated prior to induction Oxygen Delivery Method: Circle system utilized Preoxygenation: Pre-oxygenation with 100% oxygen Induction Type: IV induction Ventilation: Mask ventilation without difficulty LMA: LMA inserted LMA Size: 4.0 Number of attempts: 1 Airway Equipment and Method: Bite block Placement Confirmation: positive ETCO2 Tube secured with: Tape Dental Injury: Teeth and Oropharynx as per pre-operative assessment

## 2017-09-26 NOTE — Telephone Encounter (Signed)
error 

## 2017-09-26 NOTE — Interval H&P Note (Signed)
History and Physical Interval Note:  09/26/2017 7:17 AM  Nicole Khan  has presented today for surgery, with the diagnosis of abnormal mammogram left breast  The various methods of treatment have been discussed with the patient and family. After consideration of risks, benefits and other options for treatment, the patient has consented to  Procedure(s): LEFT BREAST LUMPECTOMY WITH RADIOACTIVE SEED LOCALIZATION ERAS PATHWAY (Left) as a surgical intervention .  The patient's history has been reviewed, patient examined, no change in status, stable for surgery.  I have reviewed the patient's chart and labs.  Questions were answered to the patient's satisfaction.     Adin Hector

## 2017-09-26 NOTE — Anesthesia Preprocedure Evaluation (Signed)
Anesthesia Evaluation  Patient identified by MRN, date of birth, ID band Patient awake    Reviewed: Allergy & Precautions, NPO status , Patient's Chart, lab work & pertinent test results  History of Anesthesia Complications (+) PONV and DIFFICULT AIRWAY  Airway Mallampati: III  TM Distance: <3 FB Neck ROM: Full    Dental no notable dental hx.    Pulmonary sleep apnea ,    Pulmonary exam normal breath sounds clear to auscultation       Cardiovascular hypertension, Normal cardiovascular exam Rhythm:Regular Rate:Normal     Neuro/Psych Anxiety negative neurological ROS     GI/Hepatic negative GI ROS, Neg liver ROS,   Endo/Other  diabetes  Renal/GU negative Renal ROS  negative genitourinary   Musculoskeletal negative musculoskeletal ROS (+)   Abdominal   Peds negative pediatric ROS (+)  Hematology negative hematology ROS (+)   Anesthesia Other Findings   Reproductive/Obstetrics negative OB ROS                             Anesthesia Physical Anesthesia Plan  ASA: II  Anesthesia Plan: General   Post-op Pain Management:    Induction: Intravenous  PONV Risk Score and Plan: 4 or greater and Ondansetron, Treatment may vary due to age or medical condition, Dexamethasone, Scopolamine patch - Pre-op and Midazolam  Airway Management Planned: LMA  Additional Equipment:   Intra-op Plan:   Post-operative Plan: Extubation in OR  Informed Consent: I have reviewed the patients History and Physical, chart, labs and discussed the procedure including the risks, benefits and alternatives for the proposed anesthesia with the patient or authorized representative who has indicated his/her understanding and acceptance.   Dental advisory given  Plan Discussed with: CRNA and Surgeon  Anesthesia Plan Comments:         Anesthesia Quick Evaluation

## 2017-09-29 ENCOUNTER — Encounter (HOSPITAL_BASED_OUTPATIENT_CLINIC_OR_DEPARTMENT_OTHER): Payer: Self-pay | Admitting: General Surgery

## 2017-09-29 NOTE — Progress Notes (Signed)
Inform patient of Pathology report,. Pathology shows atypical ductal hyperplasia. NO CANCER. No furtrher surgery needed. Will discuss in detail at next OV. Let me know you reached her   hmi

## 2017-10-08 ENCOUNTER — Other Ambulatory Visit: Payer: Self-pay | Admitting: Certified Nurse Midwife

## 2017-10-08 ENCOUNTER — Other Ambulatory Visit: Payer: Self-pay

## 2017-10-08 DIAGNOSIS — J3081 Allergic rhinitis due to animal (cat) (dog) hair and dander: Secondary | ICD-10-CM | POA: Diagnosis not present

## 2017-10-08 DIAGNOSIS — J3089 Other allergic rhinitis: Secondary | ICD-10-CM | POA: Diagnosis not present

## 2017-10-08 DIAGNOSIS — J301 Allergic rhinitis due to pollen: Secondary | ICD-10-CM | POA: Diagnosis not present

## 2017-10-08 DIAGNOSIS — Z8742 Personal history of other diseases of the female genital tract: Secondary | ICD-10-CM

## 2017-10-08 MED ORDER — SPIRONOLACTONE 100 MG PO TABS: ORAL_TABLET | ORAL | 3 refills | Status: DC

## 2017-10-08 MED FILL — NORETHINDRONE 0.35 MG TAB: 0.35 | 84 days supply | Qty: 84 | Fill #0

## 2017-10-08 MED FILL — VIT D2 1.25 MG (50,000 UNIT: 1.25 MG | 84 days supply | Qty: 12 | Fill #2

## 2017-10-08 MED FILL — BUPROPION HCL XL 300 MG TAB: 300 | 90 days supply | Qty: 90 | Fill #2

## 2017-10-08 MED FILL — SPIRONOLACTONE 100 MG TABS: 100 | 90 days supply | Qty: 180 | Fill #0

## 2017-10-08 NOTE — Telephone Encounter (Signed)
Patient states that she would like to continue the Micronor. She also asked if it would be ok for her to start taking the spironolactone again. I told her I would call her back with your response on that medication.

## 2017-10-08 NOTE — Telephone Encounter (Signed)
She can restart both.

## 2017-10-08 NOTE — Telephone Encounter (Signed)
Medication refill request: Micronor  Last AEX:  04/23/17 Next AEX: 04/24/17 Last MMG (if hormonal medication request): 06/27/17 Birads category 4 suspicious. Patient had a surgical specimen on 09/26/17 Refill authorized: Please advise.

## 2017-10-08 NOTE — Telephone Encounter (Signed)
Patient needs phone call regarding breast biopsy and whether she should stay on progesterone. Did her MD discuss with her.

## 2017-10-08 NOTE — Telephone Encounter (Signed)
Medication refill request: Spironolactone 100 mg Last AEX:  04/23/17 Next AEX: 04/24/18 Last MMG (if hormonal medication request):  06/20/17 Bi-rads Category 4 suspicious but after BX was found benign  Refill authorized: Spoke with patient she stated that she needed a refill

## 2017-10-16 DIAGNOSIS — J3081 Allergic rhinitis due to animal (cat) (dog) hair and dander: Secondary | ICD-10-CM | POA: Diagnosis not present

## 2017-10-16 DIAGNOSIS — J301 Allergic rhinitis due to pollen: Secondary | ICD-10-CM | POA: Diagnosis not present

## 2017-10-16 DIAGNOSIS — J3089 Other allergic rhinitis: Secondary | ICD-10-CM | POA: Diagnosis not present

## 2017-10-20 MED FILL — MONTELUKAST SOD 10 MG TAB: 10 | 30 days supply | Qty: 30 | Fill #1

## 2017-10-20 MED FILL — SYNJARDY XR 12.5-1,000 MG T: 12.5-1000 | 90 days supply | Qty: 180 | Fill #0

## 2017-10-20 MED FILL — LEVOCETIRIZINE 5 MG TABLET: 5 | 30 days supply | Qty: 30 | Fill #1

## 2017-10-24 DIAGNOSIS — J3089 Other allergic rhinitis: Secondary | ICD-10-CM | POA: Diagnosis not present

## 2017-10-24 DIAGNOSIS — J3081 Allergic rhinitis due to animal (cat) (dog) hair and dander: Secondary | ICD-10-CM | POA: Diagnosis not present

## 2017-10-24 DIAGNOSIS — J301 Allergic rhinitis due to pollen: Secondary | ICD-10-CM | POA: Diagnosis not present

## 2017-10-28 DIAGNOSIS — J301 Allergic rhinitis due to pollen: Secondary | ICD-10-CM | POA: Diagnosis not present

## 2017-10-28 DIAGNOSIS — J3081 Allergic rhinitis due to animal (cat) (dog) hair and dander: Secondary | ICD-10-CM | POA: Diagnosis not present

## 2017-10-28 DIAGNOSIS — J3089 Other allergic rhinitis: Secondary | ICD-10-CM | POA: Diagnosis not present

## 2017-11-05 DIAGNOSIS — J301 Allergic rhinitis due to pollen: Secondary | ICD-10-CM | POA: Diagnosis not present

## 2017-11-05 DIAGNOSIS — J3089 Other allergic rhinitis: Secondary | ICD-10-CM | POA: Diagnosis not present

## 2017-11-05 DIAGNOSIS — J3081 Allergic rhinitis due to animal (cat) (dog) hair and dander: Secondary | ICD-10-CM | POA: Diagnosis not present

## 2017-11-12 DIAGNOSIS — J3089 Other allergic rhinitis: Secondary | ICD-10-CM | POA: Diagnosis not present

## 2017-11-12 DIAGNOSIS — J301 Allergic rhinitis due to pollen: Secondary | ICD-10-CM | POA: Diagnosis not present

## 2017-11-12 DIAGNOSIS — J3081 Allergic rhinitis due to animal (cat) (dog) hair and dander: Secondary | ICD-10-CM | POA: Diagnosis not present

## 2017-11-17 MED FILL — CITALOPRAM HBR 20 MG TABLET: 20 | 90 days supply | Qty: 90 | Fill #0

## 2017-11-17 MED FILL — ATORVASTATIN 40 MG TABLET: 40 | 90 days supply | Qty: 90 | Fill #1

## 2017-11-27 DIAGNOSIS — J301 Allergic rhinitis due to pollen: Secondary | ICD-10-CM | POA: Diagnosis not present

## 2017-11-27 DIAGNOSIS — J3089 Other allergic rhinitis: Secondary | ICD-10-CM | POA: Diagnosis not present

## 2017-11-27 DIAGNOSIS — J3081 Allergic rhinitis due to animal (cat) (dog) hair and dander: Secondary | ICD-10-CM | POA: Diagnosis not present

## 2017-12-02 DIAGNOSIS — J301 Allergic rhinitis due to pollen: Secondary | ICD-10-CM | POA: Diagnosis not present

## 2017-12-02 DIAGNOSIS — J3089 Other allergic rhinitis: Secondary | ICD-10-CM | POA: Diagnosis not present

## 2017-12-02 DIAGNOSIS — J3081 Allergic rhinitis due to animal (cat) (dog) hair and dander: Secondary | ICD-10-CM | POA: Diagnosis not present

## 2017-12-03 MED FILL — MONTELUKAST SOD 10 MG TAB: 10 | 30 days supply | Qty: 30 | Fill #0

## 2017-12-03 MED FILL — LEVOCETIRIZINE 5 MG TABLET: 5 | 30 days supply | Qty: 30 | Fill #0

## 2017-12-08 DIAGNOSIS — J3089 Other allergic rhinitis: Secondary | ICD-10-CM | POA: Diagnosis not present

## 2017-12-08 DIAGNOSIS — J301 Allergic rhinitis due to pollen: Secondary | ICD-10-CM | POA: Diagnosis not present

## 2017-12-08 DIAGNOSIS — J3081 Allergic rhinitis due to animal (cat) (dog) hair and dander: Secondary | ICD-10-CM | POA: Diagnosis not present

## 2017-12-18 DIAGNOSIS — J3081 Allergic rhinitis due to animal (cat) (dog) hair and dander: Secondary | ICD-10-CM | POA: Diagnosis not present

## 2017-12-18 DIAGNOSIS — J3089 Other allergic rhinitis: Secondary | ICD-10-CM | POA: Diagnosis not present

## 2017-12-18 DIAGNOSIS — J301 Allergic rhinitis due to pollen: Secondary | ICD-10-CM | POA: Diagnosis not present

## 2017-12-26 MED FILL — NORETHINDRONE 0.35 MG TAB: 0.35 | 84 days supply | Qty: 84 | Fill #1

## 2017-12-26 MED FILL — VIT D2 1.25 MG (50,000 UNIT: 1.25 MG | 84 days supply | Qty: 12 | Fill #3

## 2017-12-26 MED FILL — METOPROLOL TARTRATE 100 MG: 100 | 90 days supply | Qty: 180 | Fill #3

## 2017-12-29 DIAGNOSIS — J3081 Allergic rhinitis due to animal (cat) (dog) hair and dander: Secondary | ICD-10-CM | POA: Diagnosis not present

## 2017-12-29 DIAGNOSIS — J3089 Other allergic rhinitis: Secondary | ICD-10-CM | POA: Diagnosis not present

## 2017-12-29 DIAGNOSIS — J301 Allergic rhinitis due to pollen: Secondary | ICD-10-CM | POA: Diagnosis not present

## 2017-12-31 DIAGNOSIS — F325 Major depressive disorder, single episode, in full remission: Secondary | ICD-10-CM | POA: Diagnosis not present

## 2017-12-31 DIAGNOSIS — Z23 Encounter for immunization: Secondary | ICD-10-CM | POA: Diagnosis not present

## 2017-12-31 DIAGNOSIS — E559 Vitamin D deficiency, unspecified: Secondary | ICD-10-CM | POA: Diagnosis not present

## 2017-12-31 DIAGNOSIS — Z6836 Body mass index (BMI) 36.0-36.9, adult: Secondary | ICD-10-CM | POA: Diagnosis not present

## 2017-12-31 MED FILL — BuPROPion HCL ER (XL) 300 M: 300 | 90 days supply | Qty: 90 | Fill #0

## 2018-01-02 DIAGNOSIS — Z5181 Encounter for therapeutic drug level monitoring: Secondary | ICD-10-CM | POA: Diagnosis not present

## 2018-01-02 DIAGNOSIS — E119 Type 2 diabetes mellitus without complications: Secondary | ICD-10-CM | POA: Diagnosis not present

## 2018-01-02 DIAGNOSIS — Z8249 Family history of ischemic heart disease and other diseases of the circulatory system: Secondary | ICD-10-CM | POA: Diagnosis not present

## 2018-01-02 DIAGNOSIS — Z87442 Personal history of urinary calculi: Secondary | ICD-10-CM | POA: Diagnosis not present

## 2018-01-02 DIAGNOSIS — E282 Polycystic ovarian syndrome: Secondary | ICD-10-CM | POA: Diagnosis not present

## 2018-01-08 DIAGNOSIS — J301 Allergic rhinitis due to pollen: Secondary | ICD-10-CM | POA: Diagnosis not present

## 2018-01-08 DIAGNOSIS — J3081 Allergic rhinitis due to animal (cat) (dog) hair and dander: Secondary | ICD-10-CM | POA: Diagnosis not present

## 2018-01-08 DIAGNOSIS — J45990 Exercise induced bronchospasm: Secondary | ICD-10-CM | POA: Diagnosis not present

## 2018-01-08 DIAGNOSIS — Z91018 Allergy to other foods: Secondary | ICD-10-CM | POA: Diagnosis not present

## 2018-01-08 DIAGNOSIS — J3089 Other allergic rhinitis: Secondary | ICD-10-CM | POA: Diagnosis not present

## 2018-01-08 MED FILL — MONTELUKAST SOD 10 MG TAB: 10 | 30 days supply | Qty: 30 | Fill #0

## 2018-01-08 MED FILL — LEVOCETIRIZINE 5 MG TABLET: 5 | 30 days supply | Qty: 30 | Fill #0

## 2018-01-08 MED FILL — SPIRONOLACTONE 100 MG TAB: 100 | 90 days supply | Qty: 180 | Fill #1

## 2018-01-09 MED FILL — EPINEPHRINE 0.3 MG AUTO-INJ: 0.3 | 30 days supply | Qty: 2 | Fill #0

## 2018-01-09 MED FILL — PROAIR RESPICLICK INHAL PWD: 108 (90 BAS | 17 days supply | Qty: 1 | Fill #0

## 2018-01-16 DIAGNOSIS — J301 Allergic rhinitis due to pollen: Secondary | ICD-10-CM | POA: Diagnosis not present

## 2018-01-16 DIAGNOSIS — J3081 Allergic rhinitis due to animal (cat) (dog) hair and dander: Secondary | ICD-10-CM | POA: Diagnosis not present

## 2018-01-16 DIAGNOSIS — J3089 Other allergic rhinitis: Secondary | ICD-10-CM | POA: Diagnosis not present

## 2018-01-21 DIAGNOSIS — J301 Allergic rhinitis due to pollen: Secondary | ICD-10-CM | POA: Diagnosis not present

## 2018-01-21 DIAGNOSIS — J3081 Allergic rhinitis due to animal (cat) (dog) hair and dander: Secondary | ICD-10-CM | POA: Diagnosis not present

## 2018-01-21 DIAGNOSIS — J3089 Other allergic rhinitis: Secondary | ICD-10-CM | POA: Diagnosis not present

## 2018-01-29 MED FILL — metFORMIN HCL ER 500 MG TB2: 500 | 30 days supply | Qty: 120 | Fill #0

## 2018-01-30 DIAGNOSIS — J301 Allergic rhinitis due to pollen: Secondary | ICD-10-CM | POA: Diagnosis not present

## 2018-01-30 DIAGNOSIS — J3089 Other allergic rhinitis: Secondary | ICD-10-CM | POA: Diagnosis not present

## 2018-01-30 DIAGNOSIS — J3081 Allergic rhinitis due to animal (cat) (dog) hair and dander: Secondary | ICD-10-CM | POA: Diagnosis not present

## 2018-02-02 MED FILL — TRULICITY 0.75 MG/0.5 ML PE: 0.75 | 28 days supply | Qty: 2 | Fill #0

## 2018-02-03 DIAGNOSIS — J3081 Allergic rhinitis due to animal (cat) (dog) hair and dander: Secondary | ICD-10-CM | POA: Diagnosis not present

## 2018-02-03 DIAGNOSIS — J3089 Other allergic rhinitis: Secondary | ICD-10-CM | POA: Diagnosis not present

## 2018-02-03 DIAGNOSIS — J301 Allergic rhinitis due to pollen: Secondary | ICD-10-CM | POA: Diagnosis not present

## 2018-02-06 MED FILL — ATORVASTATIN 40 MG TABLET: 40 | 90 days supply | Qty: 90 | Fill #2

## 2018-02-11 DIAGNOSIS — J301 Allergic rhinitis due to pollen: Secondary | ICD-10-CM | POA: Diagnosis not present

## 2018-02-11 DIAGNOSIS — J3089 Other allergic rhinitis: Secondary | ICD-10-CM | POA: Diagnosis not present

## 2018-02-11 DIAGNOSIS — J3081 Allergic rhinitis due to animal (cat) (dog) hair and dander: Secondary | ICD-10-CM | POA: Diagnosis not present

## 2018-02-17 MED FILL — LEVOCETIRIZINE 5 MG TABLET: 5 | 30 days supply | Qty: 30 | Fill #1

## 2018-02-17 MED FILL — MONTELUKAST SOD 10 MG TAB: 10 | 30 days supply | Qty: 30 | Fill #1

## 2018-02-25 DIAGNOSIS — J301 Allergic rhinitis due to pollen: Secondary | ICD-10-CM | POA: Diagnosis not present

## 2018-02-25 DIAGNOSIS — J3081 Allergic rhinitis due to animal (cat) (dog) hair and dander: Secondary | ICD-10-CM | POA: Diagnosis not present

## 2018-02-25 DIAGNOSIS — J3089 Other allergic rhinitis: Secondary | ICD-10-CM | POA: Diagnosis not present

## 2018-02-25 MED FILL — CITALOPRAM HBR 20 MG TABLET: 20 | 90 days supply | Qty: 90 | Fill #0

## 2018-03-05 MED FILL — TRULICITY 0.75 MG/0.5 ML PE: 0.75 | 28 days supply | Qty: 2 | Fill #1

## 2018-03-05 MED FILL — metFORMIN HCL ER 500 MG TB2: 500 | 30 days supply | Qty: 120 | Fill #1

## 2018-03-06 DIAGNOSIS — J3089 Other allergic rhinitis: Secondary | ICD-10-CM | POA: Diagnosis not present

## 2018-03-06 DIAGNOSIS — J3081 Allergic rhinitis due to animal (cat) (dog) hair and dander: Secondary | ICD-10-CM | POA: Diagnosis not present

## 2018-03-06 DIAGNOSIS — J301 Allergic rhinitis due to pollen: Secondary | ICD-10-CM | POA: Diagnosis not present

## 2018-03-19 DIAGNOSIS — J3081 Allergic rhinitis due to animal (cat) (dog) hair and dander: Secondary | ICD-10-CM | POA: Diagnosis not present

## 2018-03-19 DIAGNOSIS — J3089 Other allergic rhinitis: Secondary | ICD-10-CM | POA: Diagnosis not present

## 2018-03-19 DIAGNOSIS — J301 Allergic rhinitis due to pollen: Secondary | ICD-10-CM | POA: Diagnosis not present

## 2018-03-27 MED FILL — VIT D2 1.25 MG (50,000 UNIT: 1.25 MG | 84 days supply | Qty: 12 | Fill #0

## 2018-03-27 MED FILL — LEVOCETIRIZINE 5 MG TABLET: 5 | 30 days supply | Qty: 30 | Fill #2

## 2018-03-27 MED FILL — MONTELUKAST SOD 10 MG TAB: 10 | 30 days supply | Qty: 30 | Fill #2

## 2018-03-27 MED FILL — NORLYDA 0.35 MG TABS: 0.35 | 84 days supply | Qty: 84 | Fill #2

## 2018-04-06 DIAGNOSIS — J3089 Other allergic rhinitis: Secondary | ICD-10-CM | POA: Diagnosis not present

## 2018-04-06 DIAGNOSIS — J3081 Allergic rhinitis due to animal (cat) (dog) hair and dander: Secondary | ICD-10-CM | POA: Diagnosis not present

## 2018-04-06 DIAGNOSIS — J301 Allergic rhinitis due to pollen: Secondary | ICD-10-CM | POA: Diagnosis not present

## 2018-04-06 MED FILL — TRULICITY 0.75 MG/0.5 ML PE: 0.75 | 28 days supply | Qty: 2 | Fill #2

## 2018-04-13 DIAGNOSIS — J3081 Allergic rhinitis due to animal (cat) (dog) hair and dander: Secondary | ICD-10-CM | POA: Diagnosis not present

## 2018-04-13 DIAGNOSIS — J301 Allergic rhinitis due to pollen: Secondary | ICD-10-CM | POA: Diagnosis not present

## 2018-04-13 DIAGNOSIS — J3089 Other allergic rhinitis: Secondary | ICD-10-CM | POA: Diagnosis not present

## 2018-04-13 MED FILL — METOPROLOL TARTRATE 100 MG: 100 | 90 days supply | Qty: 180 | Fill #0

## 2018-04-15 MED FILL — metFORMIN HCL ER 500 MG TB2: 500 | 30 days supply | Qty: 120 | Fill #2

## 2018-04-21 DIAGNOSIS — Z5181 Encounter for therapeutic drug level monitoring: Secondary | ICD-10-CM | POA: Diagnosis not present

## 2018-04-21 DIAGNOSIS — E119 Type 2 diabetes mellitus without complications: Secondary | ICD-10-CM | POA: Diagnosis not present

## 2018-04-21 DIAGNOSIS — E282 Polycystic ovarian syndrome: Secondary | ICD-10-CM | POA: Diagnosis not present

## 2018-04-21 DIAGNOSIS — J301 Allergic rhinitis due to pollen: Secondary | ICD-10-CM | POA: Diagnosis not present

## 2018-04-21 DIAGNOSIS — J3081 Allergic rhinitis due to animal (cat) (dog) hair and dander: Secondary | ICD-10-CM | POA: Diagnosis not present

## 2018-04-21 DIAGNOSIS — J3089 Other allergic rhinitis: Secondary | ICD-10-CM | POA: Diagnosis not present

## 2018-04-24 ENCOUNTER — Other Ambulatory Visit: Payer: Self-pay

## 2018-04-24 ENCOUNTER — Encounter: Payer: Self-pay | Admitting: Certified Nurse Midwife

## 2018-04-24 ENCOUNTER — Ambulatory Visit: Payer: 59 | Admitting: Certified Nurse Midwife

## 2018-04-24 ENCOUNTER — Other Ambulatory Visit (HOSPITAL_COMMUNITY)
Admission: RE | Admit: 2018-04-24 | Discharge: 2018-04-24 | Disposition: A | Discharge status: Home / Self Care | Payer: 59 | Source: Ambulatory Visit | Attending: Certified Nurse Midwife | Admitting: Certified Nurse Midwife

## 2018-04-24 VITALS — BP 112/62 | HR 60 | Resp 16 | Ht 61.75 in | Wt 194.0 lb

## 2018-04-24 DIAGNOSIS — Z124 Encounter for screening for malignant neoplasm of cervix: Secondary | ICD-10-CM

## 2018-04-24 DIAGNOSIS — Z8742 Personal history of other diseases of the female genital tract: Secondary | ICD-10-CM | POA: Diagnosis not present

## 2018-04-24 DIAGNOSIS — N951 Menopausal and female climacteric states: Secondary | ICD-10-CM | POA: Diagnosis not present

## 2018-04-24 DIAGNOSIS — Z01419 Encounter for gynecological examination (general) (routine) without abnormal findings: Secondary | ICD-10-CM

## 2018-04-24 DIAGNOSIS — Z3041 Encounter for surveillance of contraceptive pills: Secondary | ICD-10-CM

## 2018-04-24 NOTE — Patient Instructions (Signed)

## 2018-04-24 NOTE — Progress Notes (Signed)
53 y.o. [id number] Married  Caucasian Fe here for annual exam. [facility] and some [facility]. Still having some stress incontinence with cough only, working on [employer] , no spontaneous leakage. Sees Endocrinology for glucose/cholesterol/hypertension management. Sees Dr.Lisa Miller for aex only. No vaginal bleeding or vaginal dryness. Some hot vaginal bleeding or vaginal dryness. ? Menopausal. Continues with POP for cycle control, but no bleeding in past year. Had lumpectomy for hyperplasia benign results, relieved. No other concerns today.  No LMP recorded. (Menstrual status: Oral contraceptives).          Sexually active: Yes.    The current method of family planning is oral progesterone-only contraceptive.    Exercising: Yes.    treadmill Smoker:  no  Review of Systems  Constitutional: Negative.   HENT: Negative.   Eyes: Negative.   Respiratory: Negative.   Cardiovascular: Negative.   Gastrointestinal: Negative.   Genitourinary: Negative.   Musculoskeletal: Negative.   Skin: Negative.   Neurological: Negative.   Endo/Heme/Allergies: Negative.   Psychiatric/Behavioral: Negative.     Health Maintenance: Pap:  04-14-14 neg HPV HR neg, 04-23-17 neg HPV HR neg History of Abnormal Pap: yes MMG:  2019 see all mmg reports bilateral & left breast biopsy hyperplasia, benign Self Breast exams: yes Colonoscopy:  2019 f/u64yrs BMD:   2017 with eagle TDaP:  2015 Shingles: not done Pneumonia: not done Hep C and HIV: not done Labs: if needed   reports that she has never smoked. She has never used smokeless tobacco. She reports that she does not drink alcohol or use drugs.  Past Medical History:  Diagnosis Date  . Abnormal mammogram of left breast 09/26/2017  . Abnormal Pap smear of cervix   . Anxiety   . Depression   . Diabetes mellitus without complication ([place])    type 2  . Fracture dislocation of right ankle joint 06-04-2013  . Fracture of distal fibula 06-04-2013  . H/O  bilateral salpingectomy 05/2015  . History of kidney stones    passed stones, no surgery required  . Hypercholesteremia   . Hypertension   . Polycystic ovary disease   . PONV (postoperative nausea and vomiting)   . Seasonal allergies   . Sleep apnea 05/2017  . SVD (spontaneous vaginal delivery)    x 1    Past Surgical History:  Procedure Laterality Date  . BREAST LUMPECTOMY WITH RADIOACTIVE SEED LOCALIZATION Left 09/26/2017   Procedure: LEFT BREAST LUMPECTOMY WITH RADIOACTIVE SEED LOCALIZATION ERAS PATHWAY;  Surgeon: Fanny Skates, MD;  Location: [place] [facility];  Service: General;  Laterality: Left;  . CARPAL TUNNEL RELEASE     bilateral  . CERVICAL CERCLAGE  07/1991; 05/1999   x 2  . CESAREAN SECTION     x 1  . CHOLECYSTECTOMY, LAPAROSCOPIC  1996  . COLONOSCOPY WITH PROPOFOL N/A 06/24/2017   Procedure: COLONOSCOPY WITH PROPOFOL;  Surgeon: Juanita Craver, MD;  [facility];  Service: Endoscopy;  Laterality: N/A;  . CYSTOSCOPY N/A 06/07/2014   Procedure: CYSTOSCOPY;  Surgeon: Megan Salon, MD;  Location: [facility];  Service: Gynecology;  Laterality: N/A;  . LAPAROSCOPIC BILATERAL SALPINGO OOPHERECTOMY Right 06/07/2014   Procedure: LAPAROSCOPIC RIGHT SALPINGO OOPHORECTOMY/COLLECTION OF PELVIC WASHINGS;  Surgeon: Megan Salon, MD;  Location: [facility];  Service: Gynecology;  Laterality: Right;  . LAPAROSCOPIC UNILATERAL SALPINGECTOMY Left 06/07/2014   Procedure: LAPAROSCOPIC UNILATERAL SALPINGECTOMY;  Surgeon: Megan Salon, MD;  Location: [facility];  Service: Gynecology;  Laterality: Left;  .  ORIF ANKLE FRACTURE Right 06/07/2013   Procedure: OPEN REDUCTION INTERNAL FIXATION (ORIF) ANKLE FRACTURE  FIBULA SYNDESMOSIS;  Surgeon: Ninetta Lights, MD;  Location: [facility];  Service: Orthopedics;  Laterality: Right;    Current Outpatient Medications  Medication Sig Dispense Refill  . acetaminophen (TYLENOL) 500 MG tablet Take 1,000 mg by mouth every 6 (six) hours as  needed for headache.    . albuterol (PROVENTIL HFA;VENTOLIN HFA) 108 (90 Base) MCG/ACT inhaler Inhale 1 puff into the lungs every 6 (six) hours as needed for wheezing or shortness of breath.    Marland Kitchen atorvastatin (LIPITOR) 40 MG tablet Take 40 mg by mouth at bedtime.   1  . buPROPion (WELLBUTRIN XL) 300 MG 24 hr tablet Take 300 mg by mouth daily.    . calcium carbonate (TUMS - DOSED IN MG ELEMENTAL CALCIUM) 500 MG chewable tablet Chew 2 tablets by mouth daily as needed for indigestion or heartburn.    . citalopram (CELEXA) 20 MG tablet Take 20 mg by mouth daily.    . ergocalciferol (VITAMIN D2) 50000 units capsule Take 50,000 Units by mouth every Friday.    . fluticasone (FLONASE) 50 MCG/ACT nasal spray Place 1 spray into both nostrils at bedtime.    Marland Kitchen levocetirizine (XYZAL) 5 MG tablet Take 5 mg by mouth daily.     . metFORMIN (GLUCOPHAGE-XR) 500 MG 24 hr tablet     . metoprolol (LOPRESSOR) 100 MG tablet Take 100 mg by mouth 2 (two) times daily.    . montelukast (SINGULAIR) 10 MG tablet Take 10 mg by mouth daily.     . Multiple Vitamins-Minerals (HAIR VITAMINS PO) Take 1 tablet by mouth daily.    . norethindrone (MICRONOR,CAMILA,ERRIN) 0.35 MG tablet TAKE 1 TABLET BY MOUTH DAILY. 84 tablet 2  . phenylephrine (SUDAFED PE) 10 MG TABS tablet Take 10 mg by mouth every 6 (six) hours as needed (for congestion).    Marland Kitchen spironolactone (ALDACTONE) 100 MG tablet One tablet twice daily 180 tablet 3  . TRULICITY 9.50 DT/2.6ZT SOPN     . UNABLE TO FIND Immunotherapy Allergy Shot. Injection to each arm once weekly on various day     No current facility-administered medications for this visit.     Family History  Problem Relation Age of Onset  . Hypertension Mother   . Infertility Mother   . Deep vein thrombosis Mother   . Anxiety disorder Mother   . Depression Mother   . Hepatitis C Mother        blood transfusion  . Aneurysm Mother        [place]  . Cancer Father        gallbladder, renal  .  Hypertension Father   . Anxiety disorder Father   . Depression Father   . Multiple births Sister   . Cancer Sister        thyroid cancer  . Thyroid disease Sister   . Anxiety disorder Sister   . Depression Sister   . Hypertension Brother   . Anxiety disorder Brother   . Depression Brother     ROS:  Pertinent items are noted in HPI.  Otherwise, a comprehensive ROS was negative.  Exam:   BP 112/62   Pulse 60   Resp 16   Ht 5' 1.75" (1.568 m)   Wt 194 lb (88 kg)   BMI 35.77 kg/m  Height: 5' 1.75" (156.8 cm) Ht Readings from Last 3 Encounters:  04/24/18 5' 1.75" (1.568 m)  09/26/17 5' 1.75" (1.568 m)  06/24/17 5\' 2"  (1.575 m)    General appearance: alert, cooperative and appears stated age Head: Normocephalic, without obvious abnormality, atraumatic Neck: no adenopathy, supple, symmetrical, trachea midline and thyroid normal to inspection and palpation Lungs: clear to auscultation bilaterally Breasts: normal appearance, no masses or tenderness, No nipple retraction or dimpling, No nipple discharge or bleeding, No axillary or supraclavicular adenopathy Heart: regular rate and rhythm Abdomen: soft, non-tender; no masses,  no organomegaly Extremities: extremities normal, atraumatic, no cyanosis or edema Skin: Skin color, texture, turgor normal. No rashes or lesions Lymph nodes: Cervical, supraclavicular, and axillary nodes normal. No abnormal inguinal nodes palpated Neurologic: Grossly normal   Pelvic: External genitalia:  no lesions , normal appearance              Urethra:  normal appearing urethra with no masses, tenderness or lesions              Bartholin's and Skene's: normal                 Vagina: normal appearing vagina with normal color and discharge, no lesions              Cervix: no cervical motion tenderness, no lesions and spotting with pap only              Pap taken: Yes.   Bimanual Exam:  Uterus:  normal size, contour, position, consistency, mobility,  non-tender and anteverted              Adnexa: normal adnexa and no mass, fullness, tenderness               Rectovaginal: Confirms               Anus:  normal sphincter tone, no lesions  Chaperone present: yes  A:  Well Woman with normal exam  Contraception POP menopausal symptoms  History of lumpectomy with hyperplasia, benign  PCOS history   Endocrine management of Hyperlipidemia, glucose  Overweight  P:   Reviewed health and wellness pertinent to exam  Discussed lab [facility] to see if POP still needed for cycle control. Patient agreeable  Lab: [facility]  Continue with follow up with mammogram as indicated  Continue follow up with MD as indicated  Continue her exercise and weight loss journey for better health management.  Pap smear: yes   counseled on breast self exam, mammography screening, feminine hygiene, adequate intake of calcium and vitamin D, diet and exercise  return annually or prn  An After Visit Summary was printed and given to the patient.

## 2018-04-25 ENCOUNTER — Other Ambulatory Visit: Payer: Self-pay | Admitting: Certified Nurse Midwife

## 2018-04-25 DIAGNOSIS — Z8742 Personal history of other diseases of the female genital tract: Secondary | ICD-10-CM

## 2018-04-25 LAB — FOLLICLE STIMULATING HORMONE: FSH: 21.5 m[IU]/mL

## 2018-04-25 MED ORDER — NORETHINDRONE 0.35 MG PO TABS
1.0000 | ORAL_TABLET | Freq: Every day | ORAL | 4 refills | Status: DC
Start: 2018-04-25 — End: 2019-06-03

## 2018-04-28 LAB — CYTOLOGY - PAP
Diagnosis: NEGATIVE
HPV: NOT DETECTED

## 2018-04-30 DIAGNOSIS — J301 Allergic rhinitis due to pollen: Secondary | ICD-10-CM | POA: Diagnosis not present

## 2018-04-30 DIAGNOSIS — J3081 Allergic rhinitis due to animal (cat) (dog) hair and dander: Secondary | ICD-10-CM | POA: Diagnosis not present

## 2018-04-30 DIAGNOSIS — J3089 Other allergic rhinitis: Secondary | ICD-10-CM | POA: Diagnosis not present

## 2018-05-06 DIAGNOSIS — M65311 Trigger thumb, right thumb: Secondary | ICD-10-CM | POA: Diagnosis not present

## 2018-05-06 DIAGNOSIS — M65321 Trigger finger, right index finger: Secondary | ICD-10-CM | POA: Diagnosis not present

## 2018-05-06 MED FILL — SPIRONOLACTONE 100 MG TAB: 100 | 90 days supply | Qty: 180 | Fill #2

## 2018-05-06 MED FILL — LEVOCETIRIZINE 5 MG TABLET: 5 | 30 days supply | Qty: 30 | Fill #3

## 2018-05-06 MED FILL — TRULICITY 0.75 MG/0.5 ML PE: 0.75 | 28 days supply | Qty: 2 | Fill #3

## 2018-05-06 MED FILL — MONTELUKAST SOD 10 MG TAB: 10 | 30 days supply | Qty: 30 | Fill #3

## 2018-05-06 MED FILL — buPROPion HCL ER (XL) 300 M: 300 | 90 days supply | Qty: 90 | Fill #1

## 2018-05-08 DIAGNOSIS — J3081 Allergic rhinitis due to animal (cat) (dog) hair and dander: Secondary | ICD-10-CM | POA: Diagnosis not present

## 2018-05-08 DIAGNOSIS — J301 Allergic rhinitis due to pollen: Secondary | ICD-10-CM | POA: Diagnosis not present

## 2018-05-08 DIAGNOSIS — J3089 Other allergic rhinitis: Secondary | ICD-10-CM | POA: Diagnosis not present

## 2018-05-11 DIAGNOSIS — J301 Allergic rhinitis due to pollen: Secondary | ICD-10-CM | POA: Diagnosis not present

## 2018-05-11 DIAGNOSIS — J3081 Allergic rhinitis due to animal (cat) (dog) hair and dander: Secondary | ICD-10-CM | POA: Diagnosis not present

## 2018-05-12 DIAGNOSIS — J3089 Other allergic rhinitis: Secondary | ICD-10-CM | POA: Diagnosis not present

## 2018-05-15 DIAGNOSIS — J301 Allergic rhinitis due to pollen: Secondary | ICD-10-CM | POA: Diagnosis not present

## 2018-05-15 DIAGNOSIS — J3081 Allergic rhinitis due to animal (cat) (dog) hair and dander: Secondary | ICD-10-CM | POA: Diagnosis not present

## 2018-05-15 DIAGNOSIS — J3089 Other allergic rhinitis: Secondary | ICD-10-CM | POA: Diagnosis not present

## 2018-05-25 MED FILL — metFORMIN HCL ER 500 MG TB2: 500 | 30 days supply | Qty: 120 | Fill #3 | Status: TO

## 2018-05-25 MED FILL — ATORVASTATIN 40 MG TABLET: 40 | 90 days supply | Qty: 90 | Fill #3

## 2018-05-27 DIAGNOSIS — J3081 Allergic rhinitis due to animal (cat) (dog) hair and dander: Secondary | ICD-10-CM | POA: Diagnosis not present

## 2018-05-27 DIAGNOSIS — J301 Allergic rhinitis due to pollen: Secondary | ICD-10-CM | POA: Diagnosis not present

## 2018-05-27 DIAGNOSIS — J3089 Other allergic rhinitis: Secondary | ICD-10-CM | POA: Diagnosis not present

## 2018-06-03 DIAGNOSIS — J3089 Other allergic rhinitis: Secondary | ICD-10-CM | POA: Diagnosis not present

## 2018-06-03 DIAGNOSIS — J3081 Allergic rhinitis due to animal (cat) (dog) hair and dander: Secondary | ICD-10-CM | POA: Diagnosis not present

## 2018-06-03 DIAGNOSIS — J301 Allergic rhinitis due to pollen: Secondary | ICD-10-CM | POA: Diagnosis not present

## 2018-06-08 DIAGNOSIS — J301 Allergic rhinitis due to pollen: Secondary | ICD-10-CM | POA: Diagnosis not present

## 2018-06-08 DIAGNOSIS — J3089 Other allergic rhinitis: Secondary | ICD-10-CM | POA: Diagnosis not present

## 2018-06-08 DIAGNOSIS — J3081 Allergic rhinitis due to animal (cat) (dog) hair and dander: Secondary | ICD-10-CM | POA: Diagnosis not present

## 2018-06-10 MED FILL — CITALOPRAM HBR 20 MG TABLET: 20 | 90 days supply | Qty: 90 | Fill #1

## 2018-06-10 MED FILL — NORLYDA 0.35 MG TABS: 0.35 | 84 days supply | Qty: 84 | Fill #0

## 2018-06-10 MED FILL — MONTELUKAST SOD 10 MG TAB: 10 | 30 days supply | Qty: 30 | Fill #4 | Status: TO

## 2018-06-10 MED FILL — TRULICITY 0.75 MG/0.5 ML PE: 0.75 | 28 days supply | Qty: 2 | Fill #0 | Status: TO

## 2018-06-10 MED FILL — LEVOCETIRIZINE 5 MG TABLET: 5 | 30 days supply | Qty: 30 | Fill #4 | Status: TO

## 2018-07-04 MED FILL — MONTELUKAST SOD 10 MG TAB: 10 | 30 days supply | Qty: 30 | Fill #0

## 2018-07-04 MED FILL — TRULICITY 0.75 MG/0.5 ML PE: 0.75 | 28 days supply | Qty: 2 | Fill #0

## 2018-07-04 MED FILL — LEVOCETIRIZINE 5 MG TABLET: 5 | 30 days supply | Qty: 30 | Fill #0

## 2018-07-04 MED FILL — metFORMIN HCL ER 500 MG TB2: 500 | 30 days supply | Qty: 120 | Fill #0

## 2018-07-06 MED FILL — METOPROLOL TARTRATE 100 MG: 100 | 90 days supply | Qty: 180 | Fill #0

## 2018-07-06 MED FILL — VIT D2 1.25 MG (50,000 UNIT: 1.25 MG | 84 days supply | Qty: 12 | Fill #0

## 2018-07-27 MED FILL — TRULICITY 0.75 MG/0.5 ML PE: 0.75 | 28 days supply | Qty: 2 | Fill #1

## 2018-07-28 MED FILL — metFORMIN HCL ER 500 MG TB2: 500 | 30 days supply | Qty: 120 | Fill #1

## 2018-07-28 MED FILL — LEVOCETIRIZINE DIHYDROCHLOR: 5 | 30 days supply | Qty: 30 | Fill #0

## 2018-07-28 MED FILL — MONTELUKAST SOD 10 MG TAB: 10 | 30 days supply | Qty: 30 | Fill #0

## 2018-08-06 MED FILL — SPIRONOLACTONE 100 MG TAB: 100 | 90 days supply | Qty: 180 | Fill #0

## 2018-08-21 MED FILL — LEVOCETIRIZINE 5 MG TABLET: 5 | 30 days supply | Qty: 30 | Fill #0

## 2018-08-23 MED FILL — TRULICITY 0.75 MG/0.5 ML PE: 0.75 | 28 days supply | Qty: 2 | Fill #2

## 2018-08-27 MED FILL — metFORMIN HCL ER 500 MG TB2: 500 | 30 days supply | Qty: 120 | Fill #0

## 2018-09-08 MED FILL — buPROPion HCL ER (XL) 300 M: 300 | 90 days supply | Qty: 90 | Fill #2

## 2018-09-08 MED FILL — ATORVASTATIN 40 MG TABLET: 40 | 90 days supply | Qty: 90 | Fill #0

## 2018-09-21 MED FILL — TRULICITY 0.75 MG/0.5 ML PE: 0.75 | 28 days supply | Qty: 2 | Fill #3

## 2018-09-22 MED FILL — VIT D2 1.25 MG (50,000 UNIT: 1.25 MG | 84 days supply | Qty: 12 | Fill #0

## 2018-09-24 MED FILL — NORLYDA 0.35 MG TABS: 0.35 | 84 days supply | Qty: 84 | Fill #1

## 2018-09-24 MED FILL — CITALOPRAM HBR 20 MG TABLET: 20 | 90 days supply | Qty: 90 | Fill #2

## 2018-09-25 MED FILL — MONTELUKAST SOD 10 MG TAB: 10 | 30 days supply | Qty: 30 | Fill #0

## 2018-09-28 MED FILL — METOPROLOL TARTRATE 100 MG: 100 | 90 days supply | Qty: 180 | Fill #0

## 2018-10-14 MED FILL — metFORMIN HCL ER 500 MG TB2: 500 | 30 days supply | Qty: 120 | Fill #0

## 2018-10-19 MED FILL — TRULICITY 0.75 MG/0.5 ML PE: 0.75 | 28 days supply | Qty: 2 | Fill #4

## 2018-10-20 DIAGNOSIS — Z5181 Encounter for therapeutic drug level monitoring: Secondary | ICD-10-CM | POA: Diagnosis not present

## 2018-10-20 DIAGNOSIS — E119 Type 2 diabetes mellitus without complications: Secondary | ICD-10-CM | POA: Diagnosis not present

## 2018-10-20 DIAGNOSIS — E282 Polycystic ovarian syndrome: Secondary | ICD-10-CM | POA: Diagnosis not present

## 2018-10-28 MED FILL — TRULICITY 0.75 MG/0.5 ML PE: 0.75 | 28 days supply | Qty: 2 | Fill #4 | Status: TO

## 2018-11-02 MED FILL — MONTELUKAST SOD 10 MG TAB: 10 | 30 days supply | Qty: 30 | Fill #1

## 2018-11-02 MED FILL — LEVOCETIRIZINE 5 MG TABLET: 5 | 30 days supply | Qty: 30 | Fill #0

## 2018-11-02 MED FILL — METOPROLOL TARTRATE 100 MG: 100 | 90 days supply | Qty: 180 | Fill #0

## 2018-11-05 MED FILL — TRULICITY 0.75 MG/0.5 ML PE: 0.75 | 28 days supply | Qty: 2 | Fill #0

## 2018-11-17 ENCOUNTER — Other Ambulatory Visit: Payer: Self-pay | Admitting: Certified Nurse Midwife

## 2018-11-17 MED FILL — metFORMIN HCL ER 500 MG TB2: 500 | 30 days supply | Qty: 120 | Fill #1

## 2018-11-17 NOTE — Telephone Encounter (Signed)
Medication refill request: Spironolactone  Last AEX:  04/24/18 DL Next AEX: 04/29/19 Last MMG (if hormonal medication request): see Epic for details Refill authorized: Please advise; order pended #180 w/0 refills if authorized

## 2018-11-18 MED FILL — SPIRONOLACTONE 100 MG TAB: 100 | 90 days supply | Qty: 180 | Fill #0

## 2018-12-03 MED FILL — LEVOCETIRIZINE 5 MG TABLET: 5 | 30 days supply | Qty: 30 | Fill #1

## 2018-12-03 MED FILL — ATORVASTATIN 40 MG TABLET: 40 | 90 days supply | Qty: 90 | Fill #1

## 2018-12-03 MED FILL — MONTELUKAST SOD 10 MG TAB: 10 | 30 days supply | Qty: 30 | Fill #0

## 2018-12-09 DIAGNOSIS — Z91018 Allergy to other foods: Secondary | ICD-10-CM | POA: Diagnosis not present

## 2018-12-09 DIAGNOSIS — J3089 Other allergic rhinitis: Secondary | ICD-10-CM | POA: Diagnosis not present

## 2018-12-09 DIAGNOSIS — J301 Allergic rhinitis due to pollen: Secondary | ICD-10-CM | POA: Diagnosis not present

## 2018-12-09 DIAGNOSIS — J45990 Exercise induced bronchospasm: Secondary | ICD-10-CM | POA: Diagnosis not present

## 2018-12-09 MED FILL — EPINEPHRINE 0.3 MG AUTO-INJ: 0.3 | 30 days supply | Qty: 2 | Fill #0

## 2018-12-10 ENCOUNTER — Other Ambulatory Visit: Payer: Self-pay | Admitting: Family Medicine

## 2018-12-10 DIAGNOSIS — Z1231 Encounter for screening mammogram for malignant neoplasm of breast: Secondary | ICD-10-CM

## 2018-12-15 MED FILL — TRULICITY 0.75 MG/0.5 ML PE: 0.75 | 28 days supply | Qty: 2 | Fill #1

## 2018-12-15 MED FILL — metFORMIN HCL ER 500 MG TB2: 500 | 30 days supply | Qty: 120 | Fill #2

## 2018-12-15 MED FILL — buPROPion HCL ER (XL) 300 M: 300 | 90 days supply | Qty: 90 | Fill #3

## 2018-12-15 MED FILL — NORLYDA 0.35 MG TABS: 0.35 | 84 days supply | Qty: 84 | Fill #2

## 2018-12-17 DIAGNOSIS — J301 Allergic rhinitis due to pollen: Secondary | ICD-10-CM | POA: Diagnosis not present

## 2018-12-17 DIAGNOSIS — J3089 Other allergic rhinitis: Secondary | ICD-10-CM | POA: Diagnosis not present

## 2018-12-17 DIAGNOSIS — J3081 Allergic rhinitis due to animal (cat) (dog) hair and dander: Secondary | ICD-10-CM | POA: Diagnosis not present

## 2018-12-28 DIAGNOSIS — J3089 Other allergic rhinitis: Secondary | ICD-10-CM | POA: Diagnosis not present

## 2018-12-28 DIAGNOSIS — J3081 Allergic rhinitis due to animal (cat) (dog) hair and dander: Secondary | ICD-10-CM | POA: Diagnosis not present

## 2018-12-28 DIAGNOSIS — J301 Allergic rhinitis due to pollen: Secondary | ICD-10-CM | POA: Diagnosis not present

## 2018-12-30 DIAGNOSIS — J301 Allergic rhinitis due to pollen: Secondary | ICD-10-CM | POA: Diagnosis not present

## 2018-12-30 DIAGNOSIS — J3089 Other allergic rhinitis: Secondary | ICD-10-CM | POA: Diagnosis not present

## 2018-12-30 DIAGNOSIS — J3081 Allergic rhinitis due to animal (cat) (dog) hair and dander: Secondary | ICD-10-CM | POA: Diagnosis not present

## 2019-01-01 DIAGNOSIS — J3081 Allergic rhinitis due to animal (cat) (dog) hair and dander: Secondary | ICD-10-CM | POA: Diagnosis not present

## 2019-01-01 DIAGNOSIS — J3089 Other allergic rhinitis: Secondary | ICD-10-CM | POA: Diagnosis not present

## 2019-01-01 DIAGNOSIS — J301 Allergic rhinitis due to pollen: Secondary | ICD-10-CM | POA: Diagnosis not present

## 2019-01-01 MED FILL — VIT D2 1.25 MG (50,000 UNIT: 1.25 MG | 84 days supply | Qty: 12 | Fill #0

## 2019-01-05 DIAGNOSIS — J3089 Other allergic rhinitis: Secondary | ICD-10-CM | POA: Diagnosis not present

## 2019-01-05 DIAGNOSIS — Z23 Encounter for immunization: Secondary | ICD-10-CM | POA: Diagnosis not present

## 2019-01-05 DIAGNOSIS — J3081 Allergic rhinitis due to animal (cat) (dog) hair and dander: Secondary | ICD-10-CM | POA: Diagnosis not present

## 2019-01-05 DIAGNOSIS — J301 Allergic rhinitis due to pollen: Secondary | ICD-10-CM | POA: Diagnosis not present

## 2019-01-06 DIAGNOSIS — E559 Vitamin D deficiency, unspecified: Secondary | ICD-10-CM | POA: Diagnosis not present

## 2019-01-06 DIAGNOSIS — F325 Major depressive disorder, single episode, in full remission: Secondary | ICD-10-CM | POA: Diagnosis not present

## 2019-01-06 MED FILL — CITALOPRAM HBR 20 MG TABLET: 20 | 90 days supply | Qty: 90 | Fill #0

## 2019-01-12 DIAGNOSIS — J3081 Allergic rhinitis due to animal (cat) (dog) hair and dander: Secondary | ICD-10-CM | POA: Diagnosis not present

## 2019-01-12 DIAGNOSIS — J301 Allergic rhinitis due to pollen: Secondary | ICD-10-CM | POA: Diagnosis not present

## 2019-01-12 DIAGNOSIS — J3089 Other allergic rhinitis: Secondary | ICD-10-CM | POA: Diagnosis not present

## 2019-01-12 MED FILL — MONTELUKAST SOD 10 MG TAB: 10 | 30 days supply | Qty: 30 | Fill #0

## 2019-01-12 MED FILL — LEVOCETIRIZINE 5 MG TABLET: 5 | 30 days supply | Qty: 30 | Fill #2

## 2019-01-12 MED FILL — TRULICITY 0.75 MG/0.5 ML PE: 0.75 | 28 days supply | Qty: 2 | Fill #2

## 2019-01-15 DIAGNOSIS — J3081 Allergic rhinitis due to animal (cat) (dog) hair and dander: Secondary | ICD-10-CM | POA: Diagnosis not present

## 2019-01-15 DIAGNOSIS — J3089 Other allergic rhinitis: Secondary | ICD-10-CM | POA: Diagnosis not present

## 2019-01-15 DIAGNOSIS — J301 Allergic rhinitis due to pollen: Secondary | ICD-10-CM | POA: Diagnosis not present

## 2019-01-20 DIAGNOSIS — J3089 Other allergic rhinitis: Secondary | ICD-10-CM | POA: Diagnosis not present

## 2019-01-20 DIAGNOSIS — J3081 Allergic rhinitis due to animal (cat) (dog) hair and dander: Secondary | ICD-10-CM | POA: Diagnosis not present

## 2019-01-20 DIAGNOSIS — J301 Allergic rhinitis due to pollen: Secondary | ICD-10-CM | POA: Diagnosis not present

## 2019-01-22 DIAGNOSIS — J3089 Other allergic rhinitis: Secondary | ICD-10-CM | POA: Diagnosis not present

## 2019-01-22 DIAGNOSIS — J301 Allergic rhinitis due to pollen: Secondary | ICD-10-CM | POA: Diagnosis not present

## 2019-01-22 DIAGNOSIS — J3081 Allergic rhinitis due to animal (cat) (dog) hair and dander: Secondary | ICD-10-CM | POA: Diagnosis not present

## 2019-01-25 ENCOUNTER — Ambulatory Visit
Admission: RE | Admit: 2019-01-25 | Discharge: 2019-01-25 | Disposition: A | Discharge status: Home / Self Care | Payer: 59 | Source: Ambulatory Visit | Attending: Family Medicine | Admitting: Family Medicine

## 2019-01-25 ENCOUNTER — Other Ambulatory Visit: Payer: Self-pay

## 2019-01-25 DIAGNOSIS — Z1231 Encounter for screening mammogram for malignant neoplasm of breast: Secondary | ICD-10-CM | POA: Diagnosis not present

## 2019-01-25 MED FILL — METFORMIN HCL ER 500 MG TB2: 500 | 30 days supply | Qty: 120 | Fill #3

## 2019-01-26 DIAGNOSIS — J3089 Other allergic rhinitis: Secondary | ICD-10-CM | POA: Diagnosis not present

## 2019-01-26 DIAGNOSIS — J3081 Allergic rhinitis due to animal (cat) (dog) hair and dander: Secondary | ICD-10-CM | POA: Diagnosis not present

## 2019-01-26 DIAGNOSIS — J301 Allergic rhinitis due to pollen: Secondary | ICD-10-CM | POA: Diagnosis not present

## 2019-01-29 DIAGNOSIS — J301 Allergic rhinitis due to pollen: Secondary | ICD-10-CM | POA: Diagnosis not present

## 2019-01-29 DIAGNOSIS — J3081 Allergic rhinitis due to animal (cat) (dog) hair and dander: Secondary | ICD-10-CM | POA: Diagnosis not present

## 2019-01-29 DIAGNOSIS — J3089 Other allergic rhinitis: Secondary | ICD-10-CM | POA: Diagnosis not present

## 2019-02-02 DIAGNOSIS — J3089 Other allergic rhinitis: Secondary | ICD-10-CM | POA: Diagnosis not present

## 2019-02-02 DIAGNOSIS — J301 Allergic rhinitis due to pollen: Secondary | ICD-10-CM | POA: Diagnosis not present

## 2019-02-02 DIAGNOSIS — J3081 Allergic rhinitis due to animal (cat) (dog) hair and dander: Secondary | ICD-10-CM | POA: Diagnosis not present

## 2019-02-04 DIAGNOSIS — H5213 Myopia, bilateral: Secondary | ICD-10-CM | POA: Diagnosis not present

## 2019-02-09 DIAGNOSIS — J3081 Allergic rhinitis due to animal (cat) (dog) hair and dander: Secondary | ICD-10-CM | POA: Diagnosis not present

## 2019-02-09 DIAGNOSIS — J301 Allergic rhinitis due to pollen: Secondary | ICD-10-CM | POA: Diagnosis not present

## 2019-02-09 DIAGNOSIS — J3089 Other allergic rhinitis: Secondary | ICD-10-CM | POA: Diagnosis not present

## 2019-02-09 MED FILL — METOPROLOL TARTRATE 100 MG: 100 | 90 days supply | Qty: 180 | Fill #0

## 2019-02-12 DIAGNOSIS — J3081 Allergic rhinitis due to animal (cat) (dog) hair and dander: Secondary | ICD-10-CM | POA: Diagnosis not present

## 2019-02-12 DIAGNOSIS — J301 Allergic rhinitis due to pollen: Secondary | ICD-10-CM | POA: Diagnosis not present

## 2019-02-12 DIAGNOSIS — J3089 Other allergic rhinitis: Secondary | ICD-10-CM | POA: Diagnosis not present

## 2019-02-16 DIAGNOSIS — J3081 Allergic rhinitis due to animal (cat) (dog) hair and dander: Secondary | ICD-10-CM | POA: Diagnosis not present

## 2019-02-16 DIAGNOSIS — J3089 Other allergic rhinitis: Secondary | ICD-10-CM | POA: Diagnosis not present

## 2019-02-16 DIAGNOSIS — J301 Allergic rhinitis due to pollen: Secondary | ICD-10-CM | POA: Diagnosis not present

## 2019-02-18 DIAGNOSIS — J3081 Allergic rhinitis due to animal (cat) (dog) hair and dander: Secondary | ICD-10-CM | POA: Diagnosis not present

## 2019-02-18 DIAGNOSIS — J301 Allergic rhinitis due to pollen: Secondary | ICD-10-CM | POA: Diagnosis not present

## 2019-02-18 DIAGNOSIS — J3089 Other allergic rhinitis: Secondary | ICD-10-CM | POA: Diagnosis not present

## 2019-02-22 ENCOUNTER — Other Ambulatory Visit: Payer: Self-pay | Admitting: Certified Nurse Midwife

## 2019-02-22 DIAGNOSIS — J3089 Other allergic rhinitis: Secondary | ICD-10-CM | POA: Diagnosis not present

## 2019-02-22 DIAGNOSIS — J3081 Allergic rhinitis due to animal (cat) (dog) hair and dander: Secondary | ICD-10-CM | POA: Diagnosis not present

## 2019-02-22 DIAGNOSIS — J301 Allergic rhinitis due to pollen: Secondary | ICD-10-CM | POA: Diagnosis not present

## 2019-02-22 MED FILL — MONTELUKAST SOD 10 MG TAB: 10 | 30 days supply | Qty: 30 | Fill #1

## 2019-02-22 MED FILL — SPIRONOLACTONE 100 MG TAB: 100 | 90 days supply | Qty: 180 | Fill #0

## 2019-02-22 MED FILL — LEVOCETIRIZINE 5 MG TABLET: 5 | 30 days supply | Qty: 30 | Fill #0

## 2019-02-22 MED FILL — TRULICITY 0.75 MG/0.5 ML PE: 0.75 | 28 days supply | Qty: 2 | Fill #3

## 2019-02-22 MED FILL — METFORMIN HCL ER 500 MG TB2: 500 | 30 days supply | Qty: 120 | Fill #4

## 2019-02-22 NOTE — Telephone Encounter (Signed)
Medication refill request: Progestrone Last AEX:  04/24/18 Next AEX: 04/22/19 Last MMG (if hormonal medication request):01/25/19 Bi-rads 1 Neg    Refill authorized:  #180 with 0 RF

## 2019-02-24 DIAGNOSIS — J3081 Allergic rhinitis due to animal (cat) (dog) hair and dander: Secondary | ICD-10-CM | POA: Diagnosis not present

## 2019-02-24 DIAGNOSIS — J301 Allergic rhinitis due to pollen: Secondary | ICD-10-CM | POA: Diagnosis not present

## 2019-03-01 DIAGNOSIS — J3081 Allergic rhinitis due to animal (cat) (dog) hair and dander: Secondary | ICD-10-CM | POA: Diagnosis not present

## 2019-03-01 DIAGNOSIS — J301 Allergic rhinitis due to pollen: Secondary | ICD-10-CM | POA: Diagnosis not present

## 2019-03-01 DIAGNOSIS — J3089 Other allergic rhinitis: Secondary | ICD-10-CM | POA: Diagnosis not present

## 2019-03-04 DIAGNOSIS — J3089 Other allergic rhinitis: Secondary | ICD-10-CM | POA: Diagnosis not present

## 2019-03-04 DIAGNOSIS — J301 Allergic rhinitis due to pollen: Secondary | ICD-10-CM | POA: Diagnosis not present

## 2019-03-04 DIAGNOSIS — J3081 Allergic rhinitis due to animal (cat) (dog) hair and dander: Secondary | ICD-10-CM | POA: Diagnosis not present

## 2019-03-09 DIAGNOSIS — J3089 Other allergic rhinitis: Secondary | ICD-10-CM | POA: Diagnosis not present

## 2019-03-09 DIAGNOSIS — J3081 Allergic rhinitis due to animal (cat) (dog) hair and dander: Secondary | ICD-10-CM | POA: Diagnosis not present

## 2019-03-09 DIAGNOSIS — J301 Allergic rhinitis due to pollen: Secondary | ICD-10-CM | POA: Diagnosis not present

## 2019-03-12 DIAGNOSIS — J301 Allergic rhinitis due to pollen: Secondary | ICD-10-CM | POA: Diagnosis not present

## 2019-03-12 DIAGNOSIS — J3081 Allergic rhinitis due to animal (cat) (dog) hair and dander: Secondary | ICD-10-CM | POA: Diagnosis not present

## 2019-03-12 DIAGNOSIS — J3089 Other allergic rhinitis: Secondary | ICD-10-CM | POA: Diagnosis not present

## 2019-03-16 DIAGNOSIS — J3081 Allergic rhinitis due to animal (cat) (dog) hair and dander: Secondary | ICD-10-CM | POA: Diagnosis not present

## 2019-03-16 DIAGNOSIS — J301 Allergic rhinitis due to pollen: Secondary | ICD-10-CM | POA: Diagnosis not present

## 2019-03-16 DIAGNOSIS — J3089 Other allergic rhinitis: Secondary | ICD-10-CM | POA: Diagnosis not present

## 2019-03-18 DIAGNOSIS — J3089 Other allergic rhinitis: Secondary | ICD-10-CM | POA: Diagnosis not present

## 2019-03-18 DIAGNOSIS — J3081 Allergic rhinitis due to animal (cat) (dog) hair and dander: Secondary | ICD-10-CM | POA: Diagnosis not present

## 2019-03-18 DIAGNOSIS — J301 Allergic rhinitis due to pollen: Secondary | ICD-10-CM | POA: Diagnosis not present

## 2019-03-18 MED FILL — TRULICITY 0.75 MG/0.5 ML PE: 0.75 | 28 days supply | Qty: 2 | Fill #4

## 2019-03-18 MED FILL — ATORVASTATIN 40 MG TABLET: 40 | 90 days supply | Qty: 90 | Fill #2

## 2019-03-18 MED FILL — NORLYDA 0.35 MG TABS: 0.35 | 84 days supply | Qty: 84 | Fill #3

## 2019-03-22 DIAGNOSIS — J301 Allergic rhinitis due to pollen: Secondary | ICD-10-CM | POA: Diagnosis not present

## 2019-03-22 DIAGNOSIS — J3081 Allergic rhinitis due to animal (cat) (dog) hair and dander: Secondary | ICD-10-CM | POA: Diagnosis not present

## 2019-03-22 DIAGNOSIS — J3089 Other allergic rhinitis: Secondary | ICD-10-CM | POA: Diagnosis not present

## 2019-03-31 DIAGNOSIS — J301 Allergic rhinitis due to pollen: Secondary | ICD-10-CM | POA: Diagnosis not present

## 2019-03-31 DIAGNOSIS — J3089 Other allergic rhinitis: Secondary | ICD-10-CM | POA: Diagnosis not present

## 2019-03-31 DIAGNOSIS — J3081 Allergic rhinitis due to animal (cat) (dog) hair and dander: Secondary | ICD-10-CM | POA: Diagnosis not present

## 2019-03-31 MED FILL — METFORMIN HCL ER 500 MG TB2: 500 | 30 days supply | Qty: 120 | Fill #5

## 2019-03-31 MED FILL — buPROPion HCL ER (XL) 300 M: 300 | 90 days supply | Qty: 90 | Fill #0

## 2019-03-31 MED FILL — MONTELUKAST SOD 10 MG TAB: 10 | 30 days supply | Qty: 30 | Fill #2

## 2019-03-31 MED FILL — LEVOCETIRIZINE 5 MG TABLET: 5 | 30 days supply | Qty: 30 | Fill #1

## 2019-03-31 MED FILL — VIT D2 1.25 MG (50,000 UNIT: 1.25 MG | 84 days supply | Qty: 12 | Fill #0

## 2019-04-08 DIAGNOSIS — J3089 Other allergic rhinitis: Secondary | ICD-10-CM | POA: Diagnosis not present

## 2019-04-08 DIAGNOSIS — J301 Allergic rhinitis due to pollen: Secondary | ICD-10-CM | POA: Diagnosis not present

## 2019-04-08 DIAGNOSIS — J3081 Allergic rhinitis due to animal (cat) (dog) hair and dander: Secondary | ICD-10-CM | POA: Diagnosis not present

## 2019-04-14 DIAGNOSIS — J3089 Other allergic rhinitis: Secondary | ICD-10-CM | POA: Diagnosis not present

## 2019-04-14 DIAGNOSIS — J301 Allergic rhinitis due to pollen: Secondary | ICD-10-CM | POA: Diagnosis not present

## 2019-04-14 DIAGNOSIS — J3081 Allergic rhinitis due to animal (cat) (dog) hair and dander: Secondary | ICD-10-CM | POA: Diagnosis not present

## 2019-04-20 DIAGNOSIS — Z7189 Other specified counseling: Secondary | ICD-10-CM | POA: Diagnosis not present

## 2019-04-20 DIAGNOSIS — E559 Vitamin D deficiency, unspecified: Secondary | ICD-10-CM | POA: Diagnosis not present

## 2019-04-20 DIAGNOSIS — E119 Type 2 diabetes mellitus without complications: Secondary | ICD-10-CM | POA: Diagnosis not present

## 2019-04-20 DIAGNOSIS — E282 Polycystic ovarian syndrome: Secondary | ICD-10-CM | POA: Diagnosis not present

## 2019-04-20 DIAGNOSIS — Z79899 Other long term (current) drug therapy: Secondary | ICD-10-CM | POA: Diagnosis not present

## 2019-04-20 MED FILL — TRULICITY 0.75 MG/0.5 ML PE: 0.75 | 28 days supply | Qty: 2 | Fill #5

## 2019-04-20 MED FILL — CITALOPRAM HBR 20 MG TABLET: 20 | 90 days supply | Qty: 90 | Fill #1

## 2019-04-21 DIAGNOSIS — J3081 Allergic rhinitis due to animal (cat) (dog) hair and dander: Secondary | ICD-10-CM | POA: Diagnosis not present

## 2019-04-21 DIAGNOSIS — J3089 Other allergic rhinitis: Secondary | ICD-10-CM | POA: Diagnosis not present

## 2019-04-21 DIAGNOSIS — J301 Allergic rhinitis due to pollen: Secondary | ICD-10-CM | POA: Diagnosis not present

## 2019-04-27 MED FILL — TRULICITY 1.5 MG/0.5 ML PEN: 1.5 | 28 days supply | Qty: 2 | Fill #0

## 2019-04-28 DIAGNOSIS — J3081 Allergic rhinitis due to animal (cat) (dog) hair and dander: Secondary | ICD-10-CM | POA: Diagnosis not present

## 2019-04-28 DIAGNOSIS — J301 Allergic rhinitis due to pollen: Secondary | ICD-10-CM | POA: Diagnosis not present

## 2019-04-28 DIAGNOSIS — J3089 Other allergic rhinitis: Secondary | ICD-10-CM | POA: Diagnosis not present

## 2019-04-28 MED FILL — METFORMIN HCL ER 500 MG TB2: 500 | 30 days supply | Qty: 120 | Fill #6

## 2019-04-28 MED FILL — LEVOCETIRIZINE 5 MG TABLET: 5 | 30 days supply | Qty: 30 | Fill #2

## 2019-04-28 MED FILL — MONTELUKAST SOD 10 MG TAB: 10 | 30 days supply | Qty: 30 | Fill #3

## 2019-04-29 ENCOUNTER — Ambulatory Visit: Payer: 59 | Admitting: Certified Nurse Midwife

## 2019-05-06 DIAGNOSIS — J301 Allergic rhinitis due to pollen: Secondary | ICD-10-CM | POA: Diagnosis not present

## 2019-05-06 DIAGNOSIS — J3089 Other allergic rhinitis: Secondary | ICD-10-CM | POA: Diagnosis not present

## 2019-05-06 DIAGNOSIS — J3081 Allergic rhinitis due to animal (cat) (dog) hair and dander: Secondary | ICD-10-CM | POA: Diagnosis not present

## 2019-05-13 DIAGNOSIS — J3081 Allergic rhinitis due to animal (cat) (dog) hair and dander: Secondary | ICD-10-CM | POA: Diagnosis not present

## 2019-05-13 DIAGNOSIS — J301 Allergic rhinitis due to pollen: Secondary | ICD-10-CM | POA: Diagnosis not present

## 2019-05-13 DIAGNOSIS — J3089 Other allergic rhinitis: Secondary | ICD-10-CM | POA: Diagnosis not present

## 2019-05-13 MED FILL — METOPROLOL TARTRATE 100 MG: 100 | 90 days supply | Qty: 180 | Fill #1

## 2019-05-26 ENCOUNTER — Other Ambulatory Visit: Payer: Self-pay

## 2019-05-26 ENCOUNTER — Ambulatory Visit: Payer: 59 | Attending: Internal Medicine

## 2019-05-26 DIAGNOSIS — Z20822 Contact with and (suspected) exposure to covid-19: Secondary | ICD-10-CM | POA: Diagnosis not present

## 2019-05-27 LAB — NOVEL CORONAVIRUS, NAA: SARS-CoV-2, NAA: NOT DETECTED

## 2019-05-28 DIAGNOSIS — J301 Allergic rhinitis due to pollen: Secondary | ICD-10-CM | POA: Diagnosis not present

## 2019-05-28 DIAGNOSIS — J3081 Allergic rhinitis due to animal (cat) (dog) hair and dander: Secondary | ICD-10-CM | POA: Diagnosis not present

## 2019-05-28 DIAGNOSIS — J3089 Other allergic rhinitis: Secondary | ICD-10-CM | POA: Diagnosis not present

## 2019-06-03 ENCOUNTER — Other Ambulatory Visit: Payer: Self-pay | Admitting: Certified Nurse Midwife

## 2019-06-03 DIAGNOSIS — I10 Essential (primary) hypertension: Secondary | ICD-10-CM

## 2019-06-03 DIAGNOSIS — Z8742 Personal history of other diseases of the female genital tract: Secondary | ICD-10-CM

## 2019-06-03 MED FILL — MONTELUKAST SOD 10 MG TAB: 10 | 30 days supply | Qty: 30 | Fill #4

## 2019-06-03 MED FILL — METFORMIN HCL ER 500 MG TB2: 500 | 30 days supply | Qty: 120 | Fill #7

## 2019-06-03 MED FILL — LEVOCETIRIZINE DIHYDROCHLOR: 5 | 30 days supply | Qty: 30 | Fill #3

## 2019-06-03 NOTE — Telephone Encounter (Signed)
Medication refill request: Aldactone  Last AEX:  04/24/2018 Next AEX: none scheduled at this time  Last MMG (if hormonal medication request): 01/25/19  Bi-rads 1 neg  Refill authorized: 180 with 0 RF    Medication refill request: norlyda  Last AEX:  04/24/2018 Next AEX: none scheduled at this time  Last MMG (if hormonal medication request): 01/25/19  Bi-rads 1 neg  Refill authorized: #84 with 0 RF

## 2019-06-03 NOTE — Telephone Encounter (Signed)
Patient needs to schedule appointment for aex. If schedule will refill just until that time

## 2019-06-04 DIAGNOSIS — J301 Allergic rhinitis due to pollen: Secondary | ICD-10-CM | POA: Diagnosis not present

## 2019-06-04 DIAGNOSIS — J3089 Other allergic rhinitis: Secondary | ICD-10-CM | POA: Diagnosis not present

## 2019-06-04 DIAGNOSIS — J3081 Allergic rhinitis due to animal (cat) (dog) hair and dander: Secondary | ICD-10-CM | POA: Diagnosis not present

## 2019-06-04 MED FILL — SPIRONOLACTONE 100 MG TAB: 100 | 90 days supply | Qty: 180 | Fill #0

## 2019-06-04 MED FILL — NORLYDA 0.35 MG TABS: 0.35 | 84 days supply | Qty: 84 | Fill #0

## 2019-06-04 NOTE — Telephone Encounter (Signed)
Spoke to pt. Pt agreeable to schedule AEX for Rx refills.Pt scheduled with Dr Quincy Simmonds due to Debbi's retirement. Will send in Rx today with 0RF on both until Dr Quincy Simmonds can do refills at AEX appt. Pt agreeable.   Routing to [employer], CNM for review and FYI.  Dr Quincy Simmonds for review and encounter closed.

## 2019-06-07 ENCOUNTER — Other Ambulatory Visit: Payer: Self-pay

## 2019-06-07 NOTE — Progress Notes (Signed)
54 y.o. [id number] Married Caucasian female here for annual exam.    No bleeding with her current POPs.  She had an [facility] of 21.5 last year on 04/24/18 while on her POPs.  She has hot flashes and night sweats.  She is taking Spironolactone for hair loss on her head and increased hair growth on her face.   70 year old daughter at home.  PCP: [name], MD   Endocrinology:  Delrae Rend, MD  No LMP recorded. (Menstrual status: Oral contraceptives).           Sexually active: Yes.    The current method of family planning is oral progesterone-only contraceptive.   Vasectomy.  Status post bilateral salpingectomy.  Exercising: No.  The patient does not participate in regular exercise at present. Smoker:  no  Health Maintenance: Pap: 04-14-14 Neg:Neg HR HPV, 04-23-17 Neg:Neg HR HPV History of abnormal Pap: Yes, remote past.  FU was normal. MMG: 01-25-19 3D/Neg/density B/BiRads1 Colonoscopy: 05/2017 polyps;next 5 years BMD: 2017  Result :Normal with [facility] TDaP:  2015 Gardasil:   no HIV:no Hep C:no Screening Labs:  PCP.   reports that she has never smoked. She has never used smokeless tobacco. She reports that she does not drink alcohol or use drugs.  Past Medical History:  Diagnosis Date  . Abnormal mammogram of left breast 09/26/2017  . Abnormal Pap smear of cervix   . Anxiety   . Depression   . Diabetes mellitus without complication ([place])    type 2  . Fracture dislocation of right ankle joint 06-04-2013  . Fracture of distal fibula 06-04-2013  . H/O bilateral salpingectomy 05/2015  . History of kidney stones    passed stones, no surgery required  . Hypercholesteremia   . Hypertension   . Polycystic ovary disease   . PONV (postoperative nausea and vomiting)   . Seasonal allergies   . Sleep apnea 05/2017  . SVD (spontaneous vaginal delivery)    x 1    Past Surgical History:  Procedure Laterality Date  . BREAST LUMPECTOMY WITH RADIOACTIVE SEED LOCALIZATION Left 09/26/2017    Procedure: LEFT BREAST LUMPECTOMY WITH RADIOACTIVE SEED LOCALIZATION ERAS PATHWAY;  Surgeon: Fanny Skates, MD;  Location: [place];  Service: General;  Laterality: Left;  . CARPAL TUNNEL RELEASE     bilateral  . CERVICAL CERCLAGE  07/1991; 05/1999   x 2  . CESAREAN SECTION     x 1  . CHOLECYSTECTOMY, LAPAROSCOPIC  1996  . COLONOSCOPY WITH PROPOFOL N/A 06/24/2017   Procedure: COLONOSCOPY WITH PROPOFOL;  Surgeon: Juanita Craver, MD;  [facility];  Service: Endoscopy;  Laterality: N/A;  . CYSTOSCOPY N/A 06/07/2014   Procedure: CYSTOSCOPY;  Surgeon: Megan Salon, MD;  Location: [facility];  Service: Gynecology;  Laterality: N/A;  . LAPAROSCOPIC BILATERAL SALPINGO OOPHERECTOMY Right 06/07/2014   Procedure: LAPAROSCOPIC RIGHT SALPINGO OOPHORECTOMY/COLLECTION OF PELVIC WASHINGS;  Surgeon: Megan Salon, MD;  Location: [facility];  Service: Gynecology;  Laterality: Right;  . LAPAROSCOPIC UNILATERAL SALPINGECTOMY Left 06/07/2014   Procedure: LAPAROSCOPIC UNILATERAL SALPINGECTOMY;  Surgeon: Megan Salon, MD;  Location: [facility];  Service: Gynecology;  Laterality: Left;  . ORIF ANKLE FRACTURE Right 06/07/2013   Procedure: OPEN REDUCTION INTERNAL FIXATION (ORIF) ANKLE FRACTURE  FIBULA SYNDESMOSIS;  Surgeon: Ninetta Lights, MD;  Location: [place];  Service: Orthopedics;  Laterality: Right;    Current Outpatient Medications  Medication Sig Dispense Refill  . acetaminophen (TYLENOL) 500 MG tablet Take 1,000  mg by mouth every 6 (six) hours as needed for headache.    . albuterol (PROVENTIL HFA;VENTOLIN HFA) 108 (90 Base) MCG/ACT inhaler Inhale 1 puff into the lungs every 6 (six) hours as needed for wheezing or shortness of breath.    Marland Kitchen atorvastatin (LIPITOR) 40 MG tablet Take 40 mg by mouth at bedtime.   1  . buPROPion (WELLBUTRIN XL) 300 MG 24 hr tablet Take 300 mg by mouth daily.    . Cholecalciferol (VITAMIN D3) 1.25 MG (50000 UT) TABS Take 1 tablet by mouth once a week.     . citalopram (CELEXA) 20 MG tablet Take 20 mg by mouth daily.    Marland Kitchen levocetirizine (XYZAL) 5 MG tablet Take 5 mg by mouth daily.     . metFORMIN (GLUCOPHAGE-XR) 500 MG 24 hr tablet     . metoprolol (LOPRESSOR) 100 MG tablet Take 100 mg by mouth 2 (two) times daily.    . montelukast (SINGULAIR) 10 MG tablet Take 10 mg by mouth daily.     . Multiple Vitamins-Minerals (HAIR VITAMINS PO) Take 1 tablet by mouth daily.    . NORLYDA 0.35 MG tablet TAKE 1 TABLET (0.35 MG TOTAL) BY MOUTH DAILY. 84 tablet 0  . phenylephrine (SUDAFED PE) 10 MG TABS tablet Take 10 mg by mouth every 6 (six) hours as needed (for congestion).    Marland Kitchen spironolactone (ALDACTONE) 100 MG tablet TAKE 1 TABLET BY MOUTH TWICE DAILY 99991111 tablet 0  . TRULICITY 1.5 0000000 SOPN     . UNABLE TO FIND Immunotherapy Allergy Shot. Injection to each arm once weekly on various day     No current facility-administered medications for this visit.    Family History  Problem Relation Age of Onset  . Hypertension Mother   . Infertility Mother   . Deep vein thrombosis Mother   . Anxiety disorder Mother   . Depression Mother   . Hepatitis C Mother        blood transfusion  . Aneurysm Mother        [place]  . Cancer Father        gallbladder, renal  . Hypertension Father   . Anxiety disorder Father   . Depression Father   . Multiple births Sister   . Cancer Sister        thyroid cancer  . Thyroid disease Sister   . Anxiety disorder Sister   . Depression Sister   . Hypertension Brother   . Anxiety disorder Brother   . Depression Brother     Review of Systems  All other systems reviewed and are negative.   Exam:   BP 114/70 (Cuff Size: Large)   Pulse 72   Temp (!) 97.1 F (36.2 C) (Temporal)   Resp 18   Ht 5\' 2"  (1.575 m)   Wt 199 lb 12.8 oz (90.6 kg)   BMI 36.54 kg/m     General appearance: alert, cooperative and appears stated age Head: normocephalic, without obvious abnormality, atraumatic Neck: no adenopathy,  supple, symmetrical, trachea midline and thyroid normal to inspection and palpation Lungs: clear to auscultation bilaterally Breasts: normal appearance, no masses or tenderness, No nipple retraction or dimpling, No nipple discharge or bleeding, No axillary adenopathy Heart: regular rate and rhythm Abdomen: soft, non-tender; no masses, no organomegaly Extremities: extremities normal, atraumatic, no cyanosis or edema Skin: skin color, texture, turgor normal. No rashes or lesions Lymph nodes: cervical, supraclavicular, and axillary nodes normal. Neurologic: grossly normal  Pelvic: External genitalia:  no lesions              No abnormal inguinal nodes palpated.              Urethra:  normal appearing urethra with no masses, tenderness or lesions              Bartholins and Skenes: normal                 Vagina: normal appearing vagina with normal color and discharge, no lesions              Cervix: no lesions              Pap taken: Yes.   Bimanual Exam:  Uterus:  normal size, contour, position, consistency, mobility, non-tender              Adnexa: no mass, fullness, tenderness              Rectal exam: Yes.  .  Confirms.              Anus:  normal sphincter tone, no lesions  Chaperone was present for exam.  Assessment:   Well woman visit with normal exam. Hx PCOS.  Status post bilateral salpingectomy and right oophorectomy.  On POPs.  Hx left breast lumpectomy with atypical hyperplasia.  Depression and anxiety.  On Wellbutrin XL and Citalopram.   Plan: Mammogram screening discussed. Self breast awareness reviewed. Pap and HR HPV as above. Guidelines for Calcium, Vitamin D, regular exercise program including cardiovascular and weight bearing exercise. Refill of Spironolactone 100 mg po bid.  #180, RF 3.  Stop Micronor and return for [facility] and estradiol after off Micronor for 2 weeks.  I do not recommend HRT due to her breast hyperplasia.  We discussed SSRIs and SNRIs and  Gabapentin.  She has a soy allergy and food allergies, so herbal treatment of menopause may be more difficult.   Follow up annually and prn.   After visit summary provided.

## 2019-06-08 ENCOUNTER — Other Ambulatory Visit (HOSPITAL_COMMUNITY)
Admission: RE | Admit: 2019-06-08 | Discharge: 2019-06-08 | Disposition: A | Discharge status: Home / Self Care | Payer: 59 | Source: Ambulatory Visit | Attending: Obstetrics and Gynecology | Admitting: Obstetrics and Gynecology

## 2019-06-08 ENCOUNTER — Encounter: Payer: Self-pay | Admitting: Obstetrics and Gynecology

## 2019-06-08 ENCOUNTER — Other Ambulatory Visit: Payer: Self-pay | Admitting: Obstetrics and Gynecology

## 2019-06-08 ENCOUNTER — Ambulatory Visit (INDEPENDENT_AMBULATORY_CARE_PROVIDER_SITE_OTHER): Payer: 59 | Admitting: Obstetrics and Gynecology

## 2019-06-08 VITALS — BP 114/70 | HR 72 | Temp 97.1°F | Resp 18 | Ht 62.0 in | Wt 199.8 lb

## 2019-06-08 DIAGNOSIS — N951 Menopausal and female climacteric states: Secondary | ICD-10-CM | POA: Diagnosis not present

## 2019-06-08 DIAGNOSIS — Z01419 Encounter for gynecological examination (general) (routine) without abnormal findings: Secondary | ICD-10-CM | POA: Insufficient documentation

## 2019-06-08 DIAGNOSIS — I10 Essential (primary) hypertension: Secondary | ICD-10-CM

## 2019-06-08 DIAGNOSIS — R87612 Low grade squamous intraepithelial lesion on cytologic smear of cervix (LGSIL): Secondary | ICD-10-CM | POA: Diagnosis not present

## 2019-06-08 MED ORDER — SPIRONOLACTONE 100 MG PO TABS: 100.0000 mg | ORAL_TABLET | Freq: Two times a day (BID) | ORAL | 3 refills | Status: DC

## 2019-06-08 NOTE — Patient Instructions (Signed)

## 2019-06-10 DIAGNOSIS — J301 Allergic rhinitis due to pollen: Secondary | ICD-10-CM | POA: Diagnosis not present

## 2019-06-10 DIAGNOSIS — J3089 Other allergic rhinitis: Secondary | ICD-10-CM | POA: Diagnosis not present

## 2019-06-10 DIAGNOSIS — J3081 Allergic rhinitis due to animal (cat) (dog) hair and dander: Secondary | ICD-10-CM | POA: Diagnosis not present

## 2019-06-10 LAB — CYTOLOGY - PAP
Comment: NEGATIVE
High risk HPV: NEGATIVE

## 2019-06-16 MED FILL — VIT D2 1.25 MG (50,000 UNIT: 1.25 MG | 84 days supply | Qty: 12 | Fill #1

## 2019-06-16 MED FILL — TRULICITY 1.5 MG/0.5 ML PEN: 1.5 | 28 days supply | Qty: 2 | Fill #1

## 2019-06-16 MED FILL — ATORVASTATIN 40 MG TABLET: 40 | 90 days supply | Qty: 90 | Fill #3

## 2019-06-21 ENCOUNTER — Encounter: Payer: Self-pay | Admitting: Certified Nurse Midwife

## 2019-06-22 ENCOUNTER — Other Ambulatory Visit: Payer: Self-pay

## 2019-06-22 ENCOUNTER — Other Ambulatory Visit (INDEPENDENT_AMBULATORY_CARE_PROVIDER_SITE_OTHER): Payer: 59

## 2019-06-22 DIAGNOSIS — N951 Menopausal and female climacteric states: Secondary | ICD-10-CM

## 2019-06-23 LAB — ESTRADIOL: Estradiol: 10.6 pg/mL

## 2019-06-23 LAB — FOLLICLE STIMULATING HORMONE: FSH: 42.4 m[IU]/mL

## 2019-06-24 DIAGNOSIS — J3089 Other allergic rhinitis: Secondary | ICD-10-CM | POA: Diagnosis not present

## 2019-06-24 DIAGNOSIS — J3081 Allergic rhinitis due to animal (cat) (dog) hair and dander: Secondary | ICD-10-CM | POA: Diagnosis not present

## 2019-06-24 DIAGNOSIS — J301 Allergic rhinitis due to pollen: Secondary | ICD-10-CM | POA: Diagnosis not present

## 2019-06-28 DIAGNOSIS — Z23 Encounter for immunization: Secondary | ICD-10-CM | POA: Diagnosis not present

## 2019-07-07 ENCOUNTER — Ambulatory Visit: Payer: 59 | Attending: Internal Medicine

## 2019-07-07 DIAGNOSIS — Z20822 Contact with and (suspected) exposure to covid-19: Secondary | ICD-10-CM | POA: Diagnosis not present

## 2019-07-08 LAB — NOVEL CORONAVIRUS, NAA: SARS-CoV-2, NAA: NOT DETECTED

## 2019-07-08 LAB — SARS-COV-2, NAA 2 DAY TAT

## 2019-07-12 DIAGNOSIS — J3081 Allergic rhinitis due to animal (cat) (dog) hair and dander: Secondary | ICD-10-CM | POA: Diagnosis not present

## 2019-07-12 DIAGNOSIS — J3089 Other allergic rhinitis: Secondary | ICD-10-CM | POA: Diagnosis not present

## 2019-07-12 DIAGNOSIS — J301 Allergic rhinitis due to pollen: Secondary | ICD-10-CM | POA: Diagnosis not present

## 2019-07-12 MED FILL — METFORMIN HCL ER 500 MG TB2: 500 | 30 days supply | Qty: 120 | Fill #8

## 2019-07-12 MED FILL — TRULICITY 1.5 MG/0.5 ML PEN: 1.5 | 28 days supply | Qty: 2 | Fill #2

## 2019-07-12 MED FILL — LEVOCETIRIZINE 5 MG TABLET: 5 | 30 days supply | Qty: 30 | Fill #0

## 2019-07-12 MED FILL — MONTELUKAST SOD 10 MG TAB: 10 | 30 days supply | Qty: 30 | Fill #5

## 2019-07-12 MED FILL — BUPROPION HCL ER (XL) 300 M: 300 | 90 days supply | Qty: 90 | Fill #1

## 2019-07-20 DIAGNOSIS — J3081 Allergic rhinitis due to animal (cat) (dog) hair and dander: Secondary | ICD-10-CM | POA: Diagnosis not present

## 2019-07-20 DIAGNOSIS — J3089 Other allergic rhinitis: Secondary | ICD-10-CM | POA: Diagnosis not present

## 2019-07-20 DIAGNOSIS — J301 Allergic rhinitis due to pollen: Secondary | ICD-10-CM | POA: Diagnosis not present

## 2019-07-22 DIAGNOSIS — J301 Allergic rhinitis due to pollen: Secondary | ICD-10-CM | POA: Diagnosis not present

## 2019-07-22 DIAGNOSIS — J3081 Allergic rhinitis due to animal (cat) (dog) hair and dander: Secondary | ICD-10-CM | POA: Diagnosis not present

## 2019-07-23 DIAGNOSIS — Z23 Encounter for immunization: Secondary | ICD-10-CM | POA: Diagnosis not present

## 2019-07-23 DIAGNOSIS — J3089 Other allergic rhinitis: Secondary | ICD-10-CM | POA: Diagnosis not present

## 2019-07-28 DIAGNOSIS — J3089 Other allergic rhinitis: Secondary | ICD-10-CM | POA: Diagnosis not present

## 2019-07-28 DIAGNOSIS — J301 Allergic rhinitis due to pollen: Secondary | ICD-10-CM | POA: Diagnosis not present

## 2019-07-28 DIAGNOSIS — J3081 Allergic rhinitis due to animal (cat) (dog) hair and dander: Secondary | ICD-10-CM | POA: Diagnosis not present

## 2019-07-28 MED FILL — CITALOPRAM HBR 20 MG TABLET: 20 | 90 days supply | Qty: 90 | Fill #2

## 2019-08-06 DIAGNOSIS — J3089 Other allergic rhinitis: Secondary | ICD-10-CM | POA: Diagnosis not present

## 2019-08-06 DIAGNOSIS — J3081 Allergic rhinitis due to animal (cat) (dog) hair and dander: Secondary | ICD-10-CM | POA: Diagnosis not present

## 2019-08-06 DIAGNOSIS — J301 Allergic rhinitis due to pollen: Secondary | ICD-10-CM | POA: Diagnosis not present

## 2019-08-11 DIAGNOSIS — J3081 Allergic rhinitis due to animal (cat) (dog) hair and dander: Secondary | ICD-10-CM | POA: Diagnosis not present

## 2019-08-11 DIAGNOSIS — J301 Allergic rhinitis due to pollen: Secondary | ICD-10-CM | POA: Diagnosis not present

## 2019-08-11 DIAGNOSIS — J3089 Other allergic rhinitis: Secondary | ICD-10-CM | POA: Diagnosis not present

## 2019-08-16 MED FILL — LEVOCETIRIZINE 5 MG TABLET: 5 | 30 days supply | Qty: 30 | Fill #1

## 2019-08-16 MED FILL — METOPROLOL TARTRATE 100 MG: 100 | 90 days supply | Qty: 180 | Fill #0

## 2019-08-16 MED FILL — TRULICITY 1.5 MG/0.5 ML PEN: 1.5 | 28 days supply | Qty: 2 | Fill #3

## 2019-08-16 MED FILL — MONTELUKAST SOD 10 MG TAB: 10 | 30 days supply | Qty: 30 | Fill #0

## 2019-08-23 MED FILL — METFORMIN HCL ER 500 MG TB2: 500 | 30 days supply | Qty: 120 | Fill #9

## 2019-08-25 DIAGNOSIS — J301 Allergic rhinitis due to pollen: Secondary | ICD-10-CM | POA: Diagnosis not present

## 2019-08-25 DIAGNOSIS — J3089 Other allergic rhinitis: Secondary | ICD-10-CM | POA: Diagnosis not present

## 2019-08-25 DIAGNOSIS — J3081 Allergic rhinitis due to animal (cat) (dog) hair and dander: Secondary | ICD-10-CM | POA: Diagnosis not present

## 2019-08-27 DIAGNOSIS — J3089 Other allergic rhinitis: Secondary | ICD-10-CM | POA: Diagnosis not present

## 2019-08-27 DIAGNOSIS — J301 Allergic rhinitis due to pollen: Secondary | ICD-10-CM | POA: Diagnosis not present

## 2019-08-27 DIAGNOSIS — J3081 Allergic rhinitis due to animal (cat) (dog) hair and dander: Secondary | ICD-10-CM | POA: Diagnosis not present

## 2019-09-01 DIAGNOSIS — J3089 Other allergic rhinitis: Secondary | ICD-10-CM | POA: Diagnosis not present

## 2019-09-01 DIAGNOSIS — J3081 Allergic rhinitis due to animal (cat) (dog) hair and dander: Secondary | ICD-10-CM | POA: Diagnosis not present

## 2019-09-01 DIAGNOSIS — J301 Allergic rhinitis due to pollen: Secondary | ICD-10-CM | POA: Diagnosis not present

## 2019-09-07 DIAGNOSIS — J3081 Allergic rhinitis due to animal (cat) (dog) hair and dander: Secondary | ICD-10-CM | POA: Diagnosis not present

## 2019-09-07 DIAGNOSIS — J3089 Other allergic rhinitis: Secondary | ICD-10-CM | POA: Diagnosis not present

## 2019-09-07 DIAGNOSIS — J301 Allergic rhinitis due to pollen: Secondary | ICD-10-CM | POA: Diagnosis not present

## 2019-09-24 DIAGNOSIS — J301 Allergic rhinitis due to pollen: Secondary | ICD-10-CM | POA: Diagnosis not present

## 2019-09-24 DIAGNOSIS — J3089 Other allergic rhinitis: Secondary | ICD-10-CM | POA: Diagnosis not present

## 2019-09-24 DIAGNOSIS — J3081 Allergic rhinitis due to animal (cat) (dog) hair and dander: Secondary | ICD-10-CM | POA: Diagnosis not present

## 2019-09-29 ENCOUNTER — Other Ambulatory Visit (HOSPITAL_COMMUNITY): Payer: Self-pay | Admitting: Internal Medicine

## 2019-09-29 DIAGNOSIS — J3081 Allergic rhinitis due to animal (cat) (dog) hair and dander: Secondary | ICD-10-CM | POA: Diagnosis not present

## 2019-09-29 DIAGNOSIS — J301 Allergic rhinitis due to pollen: Secondary | ICD-10-CM | POA: Diagnosis not present

## 2019-09-29 DIAGNOSIS — J3089 Other allergic rhinitis: Secondary | ICD-10-CM | POA: Diagnosis not present

## 2019-09-29 MED FILL — ATORVASTATIN 40 MG TABLET: 40 | 90 days supply | Qty: 90 | Fill #0

## 2019-10-08 ENCOUNTER — Other Ambulatory Visit (HOSPITAL_COMMUNITY): Payer: Self-pay | Admitting: Internal Medicine

## 2019-10-08 DIAGNOSIS — J3089 Other allergic rhinitis: Secondary | ICD-10-CM | POA: Diagnosis not present

## 2019-10-08 DIAGNOSIS — J3081 Allergic rhinitis due to animal (cat) (dog) hair and dander: Secondary | ICD-10-CM | POA: Diagnosis not present

## 2019-10-08 DIAGNOSIS — J301 Allergic rhinitis due to pollen: Secondary | ICD-10-CM | POA: Diagnosis not present

## 2019-10-08 MED FILL — SPIRONOLACTONE 100 MG TAB: 100 | 30 days supply | Qty: 60 | Fill #1

## 2019-10-08 MED FILL — TRULICITY 1.5 MG/0.5 ML PEN: 1.5 | 28 days supply | Qty: 2 | Fill #4

## 2019-10-08 MED FILL — METFORMIN HCL ER 500 MG TB2: 500 | 30 days supply | Qty: 120 | Fill #0

## 2019-10-12 DIAGNOSIS — J3081 Allergic rhinitis due to animal (cat) (dog) hair and dander: Secondary | ICD-10-CM | POA: Diagnosis not present

## 2019-10-12 DIAGNOSIS — J3089 Other allergic rhinitis: Secondary | ICD-10-CM | POA: Diagnosis not present

## 2019-10-12 DIAGNOSIS — J301 Allergic rhinitis due to pollen: Secondary | ICD-10-CM | POA: Diagnosis not present

## 2019-10-18 DIAGNOSIS — E282 Polycystic ovarian syndrome: Secondary | ICD-10-CM | POA: Diagnosis not present

## 2019-10-18 DIAGNOSIS — E119 Type 2 diabetes mellitus without complications: Secondary | ICD-10-CM | POA: Diagnosis not present

## 2019-10-18 DIAGNOSIS — Z7984 Long term (current) use of oral hypoglycemic drugs: Secondary | ICD-10-CM | POA: Diagnosis not present

## 2019-10-18 DIAGNOSIS — Z8639 Personal history of other endocrine, nutritional and metabolic disease: Secondary | ICD-10-CM | POA: Diagnosis not present

## 2019-10-18 MED FILL — MONTELUKAST SOD 10 MG TAB: 10 | 30 days supply | Qty: 30 | Fill #2

## 2019-10-18 MED FILL — LEVOCETIRIZINE 5 MG TABLET: 5 | 30 days supply | Qty: 30 | Fill #3

## 2019-10-18 MED FILL — BUPROPION HCL ER (XL) 300 M: 300 | 90 days supply | Qty: 90 | Fill #2

## 2019-10-21 DIAGNOSIS — J3089 Other allergic rhinitis: Secondary | ICD-10-CM | POA: Diagnosis not present

## 2019-10-21 DIAGNOSIS — J301 Allergic rhinitis due to pollen: Secondary | ICD-10-CM | POA: Diagnosis not present

## 2019-10-21 DIAGNOSIS — J3081 Allergic rhinitis due to animal (cat) (dog) hair and dander: Secondary | ICD-10-CM | POA: Diagnosis not present

## 2019-10-28 DIAGNOSIS — J301 Allergic rhinitis due to pollen: Secondary | ICD-10-CM | POA: Diagnosis not present

## 2019-10-28 DIAGNOSIS — J3081 Allergic rhinitis due to animal (cat) (dog) hair and dander: Secondary | ICD-10-CM | POA: Diagnosis not present

## 2019-10-28 DIAGNOSIS — J3089 Other allergic rhinitis: Secondary | ICD-10-CM | POA: Diagnosis not present

## 2019-11-01 ENCOUNTER — Other Ambulatory Visit (HOSPITAL_COMMUNITY): Payer: Self-pay | Admitting: Internal Medicine

## 2019-11-01 MED FILL — JARDIANCE 10 MG TABLET: 10 | 30 days supply | Qty: 30 | Fill #0

## 2019-11-05 MED FILL — METFORMIN HCL ER 500 MG TB2: 500 | 90 days supply | Qty: 360 | Fill #1

## 2019-11-05 MED FILL — TRULICITY 1.5 MG/0.5 ML PEN: 1.5 | 28 days supply | Qty: 2 | Fill #5

## 2019-11-05 MED FILL — SPIRONOLACTONE 100 MG TAB: 100 | 90 days supply | Qty: 180 | Fill #2

## 2019-11-10 DIAGNOSIS — J3089 Other allergic rhinitis: Secondary | ICD-10-CM | POA: Diagnosis not present

## 2019-11-10 DIAGNOSIS — J3081 Allergic rhinitis due to animal (cat) (dog) hair and dander: Secondary | ICD-10-CM | POA: Diagnosis not present

## 2019-11-10 DIAGNOSIS — J301 Allergic rhinitis due to pollen: Secondary | ICD-10-CM | POA: Diagnosis not present

## 2019-11-17 DIAGNOSIS — J3089 Other allergic rhinitis: Secondary | ICD-10-CM | POA: Diagnosis not present

## 2019-11-17 DIAGNOSIS — J3081 Allergic rhinitis due to animal (cat) (dog) hair and dander: Secondary | ICD-10-CM | POA: Diagnosis not present

## 2019-11-17 DIAGNOSIS — J301 Allergic rhinitis due to pollen: Secondary | ICD-10-CM | POA: Diagnosis not present

## 2019-11-17 MED FILL — METOPROLOL TARTRATE 100 MG: 100 | 90 days supply | Qty: 180 | Fill #0

## 2019-11-17 MED FILL — MONTELUKAST SOD 10 MG TAB: 10 | 30 days supply | Qty: 30 | Fill #0

## 2019-11-17 MED FILL — LEVOCETIRIZINE 5 MG TABLET: 5 | 30 days supply | Qty: 30 | Fill #4

## 2019-11-17 MED FILL — CITALOPRAM HBR 20 MG TABLET: 20 | 90 days supply | Qty: 90 | Fill #3

## 2019-11-24 DIAGNOSIS — J3089 Other allergic rhinitis: Secondary | ICD-10-CM | POA: Diagnosis not present

## 2019-11-24 DIAGNOSIS — J301 Allergic rhinitis due to pollen: Secondary | ICD-10-CM | POA: Diagnosis not present

## 2019-11-24 DIAGNOSIS — J3081 Allergic rhinitis due to animal (cat) (dog) hair and dander: Secondary | ICD-10-CM | POA: Diagnosis not present

## 2019-12-01 DIAGNOSIS — J301 Allergic rhinitis due to pollen: Secondary | ICD-10-CM | POA: Diagnosis not present

## 2019-12-01 DIAGNOSIS — J3081 Allergic rhinitis due to animal (cat) (dog) hair and dander: Secondary | ICD-10-CM | POA: Diagnosis not present

## 2019-12-01 DIAGNOSIS — J3089 Other allergic rhinitis: Secondary | ICD-10-CM | POA: Diagnosis not present

## 2019-12-08 DIAGNOSIS — J3081 Allergic rhinitis due to animal (cat) (dog) hair and dander: Secondary | ICD-10-CM | POA: Diagnosis not present

## 2019-12-08 DIAGNOSIS — J301 Allergic rhinitis due to pollen: Secondary | ICD-10-CM | POA: Diagnosis not present

## 2019-12-08 DIAGNOSIS — J3089 Other allergic rhinitis: Secondary | ICD-10-CM | POA: Diagnosis not present

## 2019-12-08 MED FILL — TRULICITY 1.5 MG/0.5 ML PEN: 1.5 | 28 days supply | Qty: 2 | Fill #6

## 2019-12-08 MED FILL — VIT D2 1.25 MG (50,000 UNIT: 1.25 MG | 84 days supply | Qty: 12 | Fill #3

## 2019-12-08 MED FILL — JARDIANCE 10 MG TABLET: 10 | 30 days supply | Qty: 30 | Fill #1

## 2019-12-13 DIAGNOSIS — Z91018 Allergy to other foods: Secondary | ICD-10-CM | POA: Diagnosis not present

## 2019-12-13 DIAGNOSIS — J3089 Other allergic rhinitis: Secondary | ICD-10-CM | POA: Diagnosis not present

## 2019-12-13 DIAGNOSIS — J45990 Exercise induced bronchospasm: Secondary | ICD-10-CM | POA: Diagnosis not present

## 2019-12-13 DIAGNOSIS — J3081 Allergic rhinitis due to animal (cat) (dog) hair and dander: Secondary | ICD-10-CM | POA: Diagnosis not present

## 2019-12-13 DIAGNOSIS — J301 Allergic rhinitis due to pollen: Secondary | ICD-10-CM | POA: Diagnosis not present

## 2019-12-13 MED FILL — AZELASTINE HCL 137 MCG SPRY: 0.1 | 50 days supply | Qty: 30 | Fill #0

## 2019-12-13 MED FILL — MONTELUKAST SOD 10 MG TAB: 10 | 30 days supply | Qty: 30 | Fill #0

## 2019-12-13 MED FILL — ALBUTEROL SULFATE HFA 108 (: 108 (90 BAS | 17 days supply | Qty: 18 | Fill #0

## 2019-12-22 DIAGNOSIS — J301 Allergic rhinitis due to pollen: Secondary | ICD-10-CM | POA: Diagnosis not present

## 2019-12-22 DIAGNOSIS — J3081 Allergic rhinitis due to animal (cat) (dog) hair and dander: Secondary | ICD-10-CM | POA: Diagnosis not present

## 2019-12-22 DIAGNOSIS — J3089 Other allergic rhinitis: Secondary | ICD-10-CM | POA: Diagnosis not present

## 2019-12-30 DIAGNOSIS — J301 Allergic rhinitis due to pollen: Secondary | ICD-10-CM | POA: Diagnosis not present

## 2019-12-30 DIAGNOSIS — J3081 Allergic rhinitis due to animal (cat) (dog) hair and dander: Secondary | ICD-10-CM | POA: Diagnosis not present

## 2019-12-30 DIAGNOSIS — J3089 Other allergic rhinitis: Secondary | ICD-10-CM | POA: Diagnosis not present

## 2020-01-05 ENCOUNTER — Other Ambulatory Visit (HOSPITAL_COMMUNITY): Payer: Self-pay | Admitting: Internal Medicine

## 2020-01-05 MED FILL — TRULICITY 1.5 MG/0.5 ML PEN: 1.5 | 28 days supply | Qty: 2 | Fill #0

## 2020-01-05 MED FILL — ATORVASTATIN 40 MG TABLET: 40 | 90 days supply | Qty: 90 | Fill #1

## 2020-01-05 MED FILL — JARDIANCE 10 MG TABLET: 10 | 30 days supply | Qty: 30 | Fill #2

## 2020-01-07 DIAGNOSIS — J3081 Allergic rhinitis due to animal (cat) (dog) hair and dander: Secondary | ICD-10-CM | POA: Diagnosis not present

## 2020-01-07 DIAGNOSIS — J301 Allergic rhinitis due to pollen: Secondary | ICD-10-CM | POA: Diagnosis not present

## 2020-01-07 DIAGNOSIS — J3089 Other allergic rhinitis: Secondary | ICD-10-CM | POA: Diagnosis not present

## 2020-01-14 DIAGNOSIS — Z23 Encounter for immunization: Secondary | ICD-10-CM | POA: Diagnosis not present

## 2020-01-14 DIAGNOSIS — J3081 Allergic rhinitis due to animal (cat) (dog) hair and dander: Secondary | ICD-10-CM | POA: Diagnosis not present

## 2020-01-14 DIAGNOSIS — J301 Allergic rhinitis due to pollen: Secondary | ICD-10-CM | POA: Diagnosis not present

## 2020-01-14 DIAGNOSIS — J3089 Other allergic rhinitis: Secondary | ICD-10-CM | POA: Diagnosis not present

## 2020-01-17 ENCOUNTER — Other Ambulatory Visit: Payer: Self-pay | Admitting: Family Medicine

## 2020-01-17 DIAGNOSIS — Z1231 Encounter for screening mammogram for malignant neoplasm of breast: Secondary | ICD-10-CM

## 2020-01-19 DIAGNOSIS — J3081 Allergic rhinitis due to animal (cat) (dog) hair and dander: Secondary | ICD-10-CM | POA: Diagnosis not present

## 2020-01-19 DIAGNOSIS — J3089 Other allergic rhinitis: Secondary | ICD-10-CM | POA: Diagnosis not present

## 2020-01-19 DIAGNOSIS — J301 Allergic rhinitis due to pollen: Secondary | ICD-10-CM | POA: Diagnosis not present

## 2020-01-28 ENCOUNTER — Ambulatory Visit
Admission: RE | Admit: 2020-01-28 | Discharge: 2020-01-28 | Disposition: A | Discharge status: Home / Self Care | Payer: 59 | Source: Ambulatory Visit | Attending: Family Medicine | Admitting: Family Medicine

## 2020-01-28 ENCOUNTER — Other Ambulatory Visit: Payer: Self-pay

## 2020-01-28 DIAGNOSIS — Z1231 Encounter for screening mammogram for malignant neoplasm of breast: Secondary | ICD-10-CM | POA: Diagnosis not present

## 2020-02-04 ENCOUNTER — Other Ambulatory Visit: Payer: Self-pay | Admitting: Family Medicine

## 2020-02-04 DIAGNOSIS — N63 Unspecified lump in unspecified breast: Secondary | ICD-10-CM

## 2020-02-04 DIAGNOSIS — J3081 Allergic rhinitis due to animal (cat) (dog) hair and dander: Secondary | ICD-10-CM | POA: Diagnosis not present

## 2020-02-04 DIAGNOSIS — J301 Allergic rhinitis due to pollen: Secondary | ICD-10-CM | POA: Diagnosis not present

## 2020-02-04 DIAGNOSIS — J3089 Other allergic rhinitis: Secondary | ICD-10-CM | POA: Diagnosis not present

## 2020-02-09 DIAGNOSIS — J301 Allergic rhinitis due to pollen: Secondary | ICD-10-CM | POA: Diagnosis not present

## 2020-02-09 DIAGNOSIS — J3081 Allergic rhinitis due to animal (cat) (dog) hair and dander: Secondary | ICD-10-CM | POA: Diagnosis not present

## 2020-02-09 MED FILL — buPROPion HCL ER (XL) 300 M: 300 | 90 days supply | Qty: 90 | Fill #0

## 2020-02-09 MED FILL — TRULICITY 1.5 MG/0.5 ML PEN: 1.5 | 28 days supply | Qty: 2 | Fill #1

## 2020-02-10 DIAGNOSIS — J3089 Other allergic rhinitis: Secondary | ICD-10-CM | POA: Diagnosis not present

## 2020-02-12 ENCOUNTER — Other Ambulatory Visit: Payer: Self-pay

## 2020-02-12 ENCOUNTER — Ambulatory Visit
Admission: RE | Admit: 2020-02-12 | Discharge: 2020-02-12 | Disposition: A | Discharge status: Home / Self Care | Payer: 59 | Source: Ambulatory Visit | Attending: Family Medicine | Admitting: Family Medicine

## 2020-02-12 ENCOUNTER — Other Ambulatory Visit: Payer: Self-pay | Admitting: Family Medicine

## 2020-02-12 DIAGNOSIS — R922 Inconclusive mammogram: Secondary | ICD-10-CM | POA: Diagnosis not present

## 2020-02-12 DIAGNOSIS — N63 Unspecified lump in unspecified breast: Secondary | ICD-10-CM

## 2020-02-12 DIAGNOSIS — N6011 Diffuse cystic mastopathy of right breast: Secondary | ICD-10-CM | POA: Diagnosis not present

## 2020-02-16 MED FILL — JARDIANCE 10 MG TABLET: 10 | 30 days supply | Qty: 30 | Fill #3

## 2020-02-16 MED FILL — SPIRONOLACTONE 100 MG TAB: 100 | 90 days supply | Qty: 180 | Fill #3

## 2020-02-17 DIAGNOSIS — J3089 Other allergic rhinitis: Secondary | ICD-10-CM | POA: Diagnosis not present

## 2020-02-17 DIAGNOSIS — J3081 Allergic rhinitis due to animal (cat) (dog) hair and dander: Secondary | ICD-10-CM | POA: Diagnosis not present

## 2020-02-17 DIAGNOSIS — J301 Allergic rhinitis due to pollen: Secondary | ICD-10-CM | POA: Diagnosis not present

## 2020-02-21 DIAGNOSIS — J3089 Other allergic rhinitis: Secondary | ICD-10-CM | POA: Diagnosis not present

## 2020-02-21 DIAGNOSIS — J301 Allergic rhinitis due to pollen: Secondary | ICD-10-CM | POA: Diagnosis not present

## 2020-02-21 DIAGNOSIS — J3081 Allergic rhinitis due to animal (cat) (dog) hair and dander: Secondary | ICD-10-CM | POA: Diagnosis not present

## 2020-02-23 MED FILL — METFORMIN HCL ER 500 MG TB2: 500 | 90 days supply | Qty: 360 | Fill #2

## 2020-02-29 ENCOUNTER — Ambulatory Visit
Admission: RE | Admit: 2020-02-29 | Discharge: 2020-02-29 | Disposition: A | Discharge status: Home / Self Care | Payer: 59 | Source: Ambulatory Visit | Attending: Family Medicine | Admitting: Family Medicine

## 2020-02-29 ENCOUNTER — Other Ambulatory Visit: Payer: Self-pay

## 2020-02-29 DIAGNOSIS — C50411 Malignant neoplasm of upper-outer quadrant of right female breast: Secondary | ICD-10-CM | POA: Diagnosis not present

## 2020-02-29 DIAGNOSIS — N6311 Unspecified lump in the right breast, upper outer quadrant: Secondary | ICD-10-CM | POA: Diagnosis not present

## 2020-02-29 DIAGNOSIS — N63 Unspecified lump in unspecified breast: Secondary | ICD-10-CM

## 2020-02-29 HISTORY — PX: BREAST BIOPSY: SHX20

## 2020-03-01 ENCOUNTER — Other Ambulatory Visit: Payer: Self-pay | Admitting: Family Medicine

## 2020-03-01 DIAGNOSIS — R921 Mammographic calcification found on diagnostic imaging of breast: Secondary | ICD-10-CM

## 2020-03-02 ENCOUNTER — Other Ambulatory Visit (HOSPITAL_COMMUNITY): Payer: Self-pay | Admitting: Family Medicine

## 2020-03-02 MED FILL — VIT D2 1.25 MG (50,000 UNIT: 1.25 MG | 84 days supply | Qty: 12 | Fill #0

## 2020-03-02 MED FILL — CITALOPRAM HBR 20 MG TABLET: 20 | 90 days supply | Qty: 90 | Fill #0

## 2020-03-02 MED FILL — METOPROLOL TARTRATE 100 MG: 100 | 90 days supply | Qty: 180 | Fill #0

## 2020-03-07 ENCOUNTER — Telehealth: Payer: Self-pay | Admitting: Obstetrics and Gynecology

## 2020-03-07 NOTE — Telephone Encounter (Signed)
Personal phone call to patient in response to her new diagnosis of right breast cancer.   She is scheduled for an additional biopsy this week and then surgical consultation with Dr. [name] on 03/14/20.  Continue in mammogram hold.   I invited her to call if she needs anything from this office.

## 2020-03-07 NOTE — Telephone Encounter (Signed)
Patient placed in MMG hold.   Encounter closed.

## 2020-03-07 NOTE — Telephone Encounter (Signed)
Routing to [place], Therapist, sports for [employer]

## 2020-03-08 DIAGNOSIS — J301 Allergic rhinitis due to pollen: Secondary | ICD-10-CM | POA: Diagnosis not present

## 2020-03-08 DIAGNOSIS — J3081 Allergic rhinitis due to animal (cat) (dog) hair and dander: Secondary | ICD-10-CM | POA: Diagnosis not present

## 2020-03-08 DIAGNOSIS — J3089 Other allergic rhinitis: Secondary | ICD-10-CM | POA: Diagnosis not present

## 2020-03-09 ENCOUNTER — Ambulatory Visit
Admission: RE | Admit: 2020-03-09 | Discharge: 2020-03-09 | Disposition: A | Discharge status: Home / Self Care | Payer: 59 | Source: Ambulatory Visit | Attending: Family Medicine | Admitting: Family Medicine

## 2020-03-09 ENCOUNTER — Other Ambulatory Visit: Payer: Self-pay

## 2020-03-09 DIAGNOSIS — R921 Mammographic calcification found on diagnostic imaging of breast: Secondary | ICD-10-CM

## 2020-03-09 DIAGNOSIS — N62 Hypertrophy of breast: Secondary | ICD-10-CM | POA: Diagnosis not present

## 2020-03-09 MED FILL — TRULICITY 1.5 MG/0.5 ML PEN: 1.5 | 28 days supply | Qty: 2 | Fill #2

## 2020-03-14 ENCOUNTER — Ambulatory Visit: Payer: Self-pay | Admitting: General Surgery

## 2020-03-14 DIAGNOSIS — C50411 Malignant neoplasm of upper-outer quadrant of right female breast: Secondary | ICD-10-CM | POA: Diagnosis not present

## 2020-03-14 DIAGNOSIS — Z170 Estrogen receptor positive status [ER+]: Secondary | ICD-10-CM

## 2020-03-15 DIAGNOSIS — J3081 Allergic rhinitis due to animal (cat) (dog) hair and dander: Secondary | ICD-10-CM | POA: Diagnosis not present

## 2020-03-15 DIAGNOSIS — J3089 Other allergic rhinitis: Secondary | ICD-10-CM | POA: Diagnosis not present

## 2020-03-15 DIAGNOSIS — J301 Allergic rhinitis due to pollen: Secondary | ICD-10-CM | POA: Diagnosis not present

## 2020-03-16 ENCOUNTER — Other Ambulatory Visit: Payer: Self-pay | Admitting: General Surgery

## 2020-03-16 ENCOUNTER — Telehealth: Payer: Self-pay | Admitting: Genetic Counselor

## 2020-03-16 DIAGNOSIS — C50411 Malignant neoplasm of upper-outer quadrant of right female breast: Secondary | ICD-10-CM

## 2020-03-16 NOTE — Telephone Encounter (Signed)
Received referrals from dr. [name] for both med onc and genetics for breast cancer. Nicole Khan has been cld and scheduled to see Dr. Lindi Adie on 12/21 at 4pm and for genetics w/Cari on 1/10 at 11am. Pt aware to arrive 30 minutes early.

## 2020-03-20 NOTE — Progress Notes (Signed)
Location of Breast Cancer: Right Breast UOQ  Did patient present with symptoms (if so, please note symptoms) or was this found on screening mammography?: Routine Mammogram  Mammogram 02/12/2020: 6 mm area of distortion in the upper outer quadrant of the right breast with clinically negative lymph nodes.  There was also some calcification posterior to this.  Histology per Pathology Report: Right Breast 02/29/2020  Receptor Status: ER(+95%), PR (+95%), Her2-neu (-), Ki-67(5%)    Past/Anticipated interventions by surgeon, if any: Dr. [name] 03/14/2020 - She appears to have a small stage I cancer in the UOQ of the right breast. - She favors breast conservation which I feel is a very reasonable way of treating her cancer. - She would be a good candidate for sentinel node biopsy at the same time. -I will go ahead and refer her to medical and radiation oncology to discuss adjuvant therapy. -Given her family history, I will also refer her for genetic counseling. -I will also send her for preoperative lymphedema testing.  -Right Breast lumpectomy with radioactive seed and SLN biopsy 04/20/2020.   Past/Anticipated interventions by medical oncology, if any: Chemotherapy  Dr. Lindi Adie 03/21/2020 4 pm   Lymphedema issues, if any: No   Pain issues, if any:  No  SAFETY ISSUES:  Prior radiation? No  Pacemaker/ICD? No  Possible current pregnancy? Postmenopausal  Is the patient on methotrexate? No  Current Complaints / other details:   -Left breast lumpectomy with radioactive seed localization ERAS pathway 09/26/2017     Cori Razor, RN 03/20/2020,12:40 PM

## 2020-03-20 NOTE — Progress Notes (Signed)
[facility] CONSULT NOTE  Patient Care Team: [name], MD as PCP - General (Family Medicine)  CHIEF COMPLAINTS/PURPOSE OF CONSULTATION:  Newly diagnosed breast cancer  HISTORY OF PRESENTING ILLNESS:  Nicole Khan 54 y.o. female is here because of recent diagnosis of invasive ductal carcinoma of the right breast. Screening mammogram on 01/28/20 showed a possible right breast mass. Diagnostic mammogram on 02/12/20 showed a 0.6cm mass at the 10 o'clock position in the right breast with multiple benign cysts. Biopsy on 02/29/20 showed invasive ductal carcinoma with DCIS, grade 1, HER-2 negative (1+), ER/PR >95%, Ki 67 5%. She presents to the clinic today for initial evaluation and discussion of treatment options.    I reviewed her records extensively and collaborated the history with the patient.  SUMMARY OF ONCOLOGIC HISTORY: Oncology History  Malignant neoplasm of upper-outer quadrant of right breast in female, estrogen receptor positive ([place])  02/29/2020 Initial Diagnosis   Screening mammogram detected right breast mass. 0.6cm mass at the 10 o'clock position with multiple benign cysts. Biopsy on 02/29/20 showed invasive ductal carcinoma with DCIS, grade 1, HER-2 negative (1+), ER/PR >95%, Ki67: 5%.    03/21/2020 Cancer Staging   Staging form: Breast, AJCC 8th Edition - Clinical stage from 03/21/2020: Stage IA (cT1b, cN0, cM0, G1, ER+, PR+, HER2-) - Signed by Nicholas Lose, MD on 03/21/2020     MEDICAL HISTORY:  Past Medical History:  Diagnosis Date  . Abnormal mammogram of left breast 09/26/2017  . Abnormal Pap smear of cervix   . Anxiety   . Depression   . Diabetes mellitus without complication ([place])    type 2  . Fracture dislocation of right ankle joint 06-04-2013  . Fracture of distal fibula 06-04-2013  . H/O bilateral salpingectomy 05/2015  . History of kidney stones    passed stones, no surgery required  . Hypercholesteremia   . Hypertension   .  Polycystic ovary disease   . PONV (postoperative nausea and vomiting)   . Seasonal allergies   . Sleep apnea 05/2017  . SVD (spontaneous vaginal delivery)    x 1    SURGICAL HISTORY: Past Surgical History:  Procedure Laterality Date  . BREAST BIOPSY Right 02/29/2020  . BREAST EXCISIONAL BIOPSY Left 2019   Benign  . BREAST LUMPECTOMY WITH RADIOACTIVE SEED LOCALIZATION Left 09/26/2017   Procedure: LEFT BREAST LUMPECTOMY WITH RADIOACTIVE SEED LOCALIZATION ERAS PATHWAY;  Surgeon: Fanny Skates, MD;  Location: [facility];  Service: General;  Laterality: Left;  . CARPAL TUNNEL RELEASE     bilateral  . CERVICAL CERCLAGE  07/1991; 05/1999   x 2  . CESAREAN SECTION     x 1  . CHOLECYSTECTOMY, LAPAROSCOPIC  1996  . COLONOSCOPY WITH PROPOFOL N/A 06/24/2017   Procedure: COLONOSCOPY WITH PROPOFOL;  Surgeon: Juanita Craver, MD;  Location: [facility];  Service: Endoscopy;  Laterality: N/A;  . CYSTOSCOPY N/A 06/07/2014   Procedure: CYSTOSCOPY;  Surgeon: Megan Salon, MD;  Location: [facility];  Service: Gynecology;  Laterality: N/A;  . LAPAROSCOPIC BILATERAL SALPINGO OOPHERECTOMY Right 06/07/2014   Procedure: LAPAROSCOPIC RIGHT SALPINGO OOPHORECTOMY/COLLECTION OF PELVIC WASHINGS;  Surgeon: Megan Salon, MD;  Location: [facility];  Service: Gynecology;  Laterality: Right;  . LAPAROSCOPIC UNILATERAL SALPINGECTOMY Left 06/07/2014   Procedure: LAPAROSCOPIC UNILATERAL SALPINGECTOMY;  Surgeon: Megan Salon, MD;  Location: [facility];  Service: Gynecology;  Laterality: Left;  . ORIF ANKLE FRACTURE Right 06/07/2013   Procedure: OPEN REDUCTION INTERNAL FIXATION (ORIF) ANKLE FRACTURE  FIBULA SYNDESMOSIS;  Surgeon: Ninetta Lights, MD;  Location: [facility];  Service: Orthopedics;  Laterality: Right;    SOCIAL HISTORY: Social History   Socioeconomic History  . Marital status: Married    Spouse name: Not on file  . Number of children: 2  . Years of education: Not on file  . Highest  education level: Not on file  Occupational History  . Not on file  Tobacco Use  . Smoking status: Never Smoker  . Smokeless tobacco: Never Used  Vaping Use  . Vaping Use: Never used  Substance and Sexual Activity  . Alcohol use: No    Alcohol/week: 0.0 standard drinks  . Drug use: No  . Sexual activity: Yes    Partners: Male    Birth control/protection: Pill    Comment: Micronor  Other Topics Concern  . Not on file  Social History Narrative  . Not on file   Social Determinants of Health   Financial Resource Strain: Not on file  Food Insecurity: Not on file  Transportation Needs: Not on file  Physical Activity: Not on file  Stress: Not on file  Social Connections: Not on file  Intimate Partner Violence: Not on file    FAMILY HISTORY: Family History  Problem Relation Age of Onset  . Hypertension Mother   . Infertility Mother   . Deep vein thrombosis Mother   . Anxiety disorder Mother   . Depression Mother   . Hepatitis C Mother        blood transfusion  . Aneurysm Mother        [place]  . Cancer Father        gallbladder, renal  . Hypertension Father   . Anxiety disorder Father   . Depression Father   . Multiple births Sister   . Cancer Sister        thyroid cancer  . Thyroid disease Sister   . Anxiety disorder Sister   . Depression Sister   . Breast cancer Sister   . Hypertension Brother   . Anxiety disorder Brother   . Depression Brother     ALLERGIES:  is allergic to soy allergy, celery oil, eggs or egg-derived products, hazel tree pollen [corylus], and tape.  MEDICATIONS:  Current Outpatient Medications  Medication Sig Dispense Refill  . acetaminophen (TYLENOL) 500 MG tablet Take 1,000 mg by mouth every 6 (six) hours as needed for headache.    . albuterol (PROVENTIL HFA;VENTOLIN HFA) 108 (90 Base) MCG/ACT inhaler Inhale 1 puff into the lungs every 6 (six) hours as needed for wheezing or shortness of breath.    Marland Kitchen atorvastatin (LIPITOR) 40 MG  tablet Take 40 mg by mouth at bedtime.   1  . buPROPion (WELLBUTRIN XL) 300 MG 24 hr tablet Take 300 mg by mouth daily.    . Cholecalciferol (VITAMIN D3) 1.25 MG (50000 UT) TABS Take 1 tablet by mouth once a week.    . citalopram (CELEXA) 20 MG tablet Take 20 mg by mouth daily.    Marland Kitchen JARDIANCE 10 MG TABS tablet Take 10 mg by mouth daily.    Marland Kitchen levocetirizine (XYZAL) 5 MG tablet Take 5 mg by mouth daily.     . metFORMIN (GLUCOPHAGE-XR) 500 MG 24 hr tablet     . metoprolol (LOPRESSOR) 100 MG tablet Take 100 mg by mouth 2 (two) times daily.    . montelukast (SINGULAIR) 10 MG tablet Take 10 mg by mouth daily.  (Patient not taking: Reported  on 03/21/2020)    . Multiple Vitamins-Minerals (HAIR VITAMINS PO) Take 1 tablet by mouth daily.    . phenylephrine (SUDAFED PE) 10 MG TABS tablet Take 10 mg by mouth every 6 (six) hours as needed (for congestion).    Marland Kitchen spironolactone (ALDACTONE) 100 MG tablet Take 1 tablet (100 mg total) by mouth 2 (two) times daily. 180 tablet 3  . TRULICITY 1.5 GE/3.6OQ SOPN     . UNABLE TO FIND Immunotherapy Allergy Shot. Injection to each arm once weekly on various day     No current facility-administered medications for this visit.    REVIEW OF SYSTEMS:   Constitutional: Denies fevers, chills or abnormal night sweats   All other systems were reviewed with the patient and are negative.  PHYSICAL EXAMINATION: ECOG PERFORMANCE STATUS: 0 - Asymptomatic  Vitals:   03/21/20 1540  BP: (!) 108/59  Pulse: 83  Resp: 17  Temp: 97.7 F (36.5 C)  SpO2: 98%   Filed Weights   03/21/20 1540  Weight: 200 lb 1.6 oz (90.8 kg)       LABORATORY DATA:  I have reviewed the data as listed Lab Results  Component Value Date   WBC 8.3 09/24/2017   HGB 15.8 (H) 09/24/2017   HCT 49.5 (H) 09/24/2017   MCV 95.4 09/24/2017   PLT 313 09/24/2017   Lab Results  Component Value Date   NA 139 09/24/2017   K 4.3 09/24/2017   CL 107 09/24/2017   CO2 24 09/24/2017    RADIOGRAPHIC  STUDIES: I have personally reviewed the radiological reports and agreed with the findings in the report.  ASSESSMENT AND PLAN:  Malignant neoplasm of upper-outer quadrant of right breast in female, estrogen receptor positive ([place]) 02/29/2020: Screening detected right breast cancer 0.6 cm at 10 o'clock position with multiple benign cysts.  Biopsy 02/29/2020: Grade 1 IDC with DCIS ER/PR greater than 95%, Ki-67: 5%, HER-2 negative T1BN0 stage Ia  Pathology and radiology counseling:Discussed with the patient, the details of pathology including the type of breast cancer,the clinical staging, the significance of ER, PR and HER-2/neu receptors and the implications for treatment. After reviewing the pathology in detail, we proceeded to discuss the different treatment options between surgery, radiation, chemotherapy, antiestrogen therapies.  Recommendations: 1. Breast conserving surgery followed by 2. Oncotype DX testing will probably not need to be done since the tumor size is small and it is grade 1. 3. Adjuvant radiation therapy followed by 4. Adjuvant antiestrogen therapy  Return to clinic after surgery to discuss final pathology report and then determine if Oncotype DX testing will need to be sent.  All questions were answered. The patient knows to call the clinic with any problems, questions or concerns.   Rulon Eisenmenger, MD, MPH 03/21/2020    I, Molly Dorshimer, am acting as scribe for Nicholas Lose, MD.  I have reviewed the above documentation for accuracy and completeness, and I agree with the above.

## 2020-03-21 ENCOUNTER — Inpatient Hospital Stay: Payer: 59 | Attending: Hematology and Oncology | Admitting: Hematology and Oncology

## 2020-03-21 ENCOUNTER — Ambulatory Visit
Admission: RE | Admit: 2020-03-21 | Discharge: 2020-03-21 | Disposition: A | Discharge status: Home / Self Care | Payer: 59 | Source: Ambulatory Visit | Attending: Radiation Oncology | Admitting: Radiation Oncology

## 2020-03-21 ENCOUNTER — Other Ambulatory Visit: Payer: Self-pay

## 2020-03-21 ENCOUNTER — Encounter: Payer: Self-pay | Admitting: Radiation Oncology

## 2020-03-21 VITALS — BP 105/54 | HR 79 | Temp 97.2°F | Resp 20 | Wt 199.0 lb

## 2020-03-21 DIAGNOSIS — E119 Type 2 diabetes mellitus without complications: Secondary | ICD-10-CM | POA: Insufficient documentation

## 2020-03-21 DIAGNOSIS — F418 Other specified anxiety disorders: Secondary | ICD-10-CM | POA: Insufficient documentation

## 2020-03-21 DIAGNOSIS — Z803 Family history of malignant neoplasm of breast: Secondary | ICD-10-CM | POA: Insufficient documentation

## 2020-03-21 DIAGNOSIS — N6001 Solitary cyst of right breast: Secondary | ICD-10-CM | POA: Insufficient documentation

## 2020-03-21 DIAGNOSIS — G4730 Sleep apnea, unspecified: Secondary | ICD-10-CM | POA: Insufficient documentation

## 2020-03-21 DIAGNOSIS — C50411 Malignant neoplasm of upper-outer quadrant of right female breast: Secondary | ICD-10-CM

## 2020-03-21 DIAGNOSIS — Z170 Estrogen receptor positive status [ER+]: Secondary | ICD-10-CM | POA: Insufficient documentation

## 2020-03-21 DIAGNOSIS — E7800 Pure hypercholesterolemia, unspecified: Secondary | ICD-10-CM | POA: Insufficient documentation

## 2020-03-21 DIAGNOSIS — Z808 Family history of malignant neoplasm of other organs or systems: Secondary | ICD-10-CM | POA: Diagnosis not present

## 2020-03-21 DIAGNOSIS — I10 Essential (primary) hypertension: Secondary | ICD-10-CM | POA: Insufficient documentation

## 2020-03-21 DIAGNOSIS — I471 Supraventricular tachycardia: Secondary | ICD-10-CM | POA: Diagnosis not present

## 2020-03-21 DIAGNOSIS — Z87442 Personal history of urinary calculi: Secondary | ICD-10-CM | POA: Insufficient documentation

## 2020-03-21 DIAGNOSIS — Z79899 Other long term (current) drug therapy: Secondary | ICD-10-CM | POA: Insufficient documentation

## 2020-03-21 NOTE — Progress Notes (Signed)
Radiation Oncology         [phone] ________________________________  Name: Nicole Khan        MRN: [id number]  Date of Service: 03/21/2020 DOB: 07-12-1965  ZD:GLOVFI, Lattie Haw, MD  Jovita Kussmaul, MD     REFERRING PHYSICIAN: [name], MD   DIAGNOSIS: The encounter diagnosis was Malignant neoplasm of upper-outer quadrant of right breast in female, estrogen receptor positive ([place]).   HISTORY OF PRESENT ILLNESS: Nicole Khan is a 54 y.o. female with a new diagnosis of right breast cancer. The patient was noted to have a screening detected mass in the right breast. This was noted with further diagnostic work-up at the 10 o'clock position, and by ultrasound measured 6 mm in greatest dimension with associated architectural distortion, multiple cysts were seen in the inner right breast, and the axilla was negative for adenopathy.  She underwent a biopsy on 02/29/2020 which revealed a grade 1 invasive ductal carcinoma.  Her tumor was ER/PR positive, HER-2 was negative, and Ki-67 was 5%.  Additional core biopsy at the 10 o'clock position on 03/09/2020 revealed columnar cell hyperplasia with calcifications.  She is scheduled to undergo lumpectomy of the right breast with sentinel lymph node biopsy on 04/20/2020.  She is scheduled to meet with Dr. Lindi Adie as well today.  She's seen to discuss options of adjuvant radiotherapy as well.   PREVIOUS RADIATION THERAPY: No   PAST MEDICAL HISTORY:  Past Medical History:  Diagnosis Date  . Abnormal mammogram of left breast 09/26/2017  . Abnormal Pap smear of cervix   . Anxiety   . Depression   . Diabetes mellitus without complication ([place])    type 2  . Fracture dislocation of right ankle joint 06-04-2013  . Fracture of distal fibula 06-04-2013  . H/O bilateral salpingectomy 05/2015  . History of kidney stones    passed stones, no surgery required  . Hypercholesteremia   . Hypertension   . Polycystic ovary disease   . PONV  (postoperative nausea and vomiting)   . Seasonal allergies   . Sleep apnea 05/2017  . SVD (spontaneous vaginal delivery)    x 1       PAST SURGICAL HISTORY: Past Surgical History:  Procedure Laterality Date  . BREAST BIOPSY Right 02/29/2020  . BREAST EXCISIONAL BIOPSY Left 2019   Benign  . BREAST LUMPECTOMY WITH RADIOACTIVE SEED LOCALIZATION Left 09/26/2017   Procedure: LEFT BREAST LUMPECTOMY WITH RADIOACTIVE SEED LOCALIZATION ERAS PATHWAY;  Surgeon: Fanny Skates, MD;  Location: [facility];  Service: General;  Laterality: Left;  . CARPAL TUNNEL RELEASE     bilateral  . CERVICAL CERCLAGE  07/1991; 05/1999   x 2  . CESAREAN SECTION     x 1  . CHOLECYSTECTOMY, LAPAROSCOPIC  1996  . COLONOSCOPY WITH PROPOFOL N/A 06/24/2017   Procedure: COLONOSCOPY WITH PROPOFOL;  Surgeon: Juanita Craver, MD;  Location: [facility];  Service: Endoscopy;  Laterality: N/A;  . CYSTOSCOPY N/A 06/07/2014   Procedure: CYSTOSCOPY;  Surgeon: Megan Salon, MD;  Location: [facility];  Service: Gynecology;  Laterality: N/A;  . LAPAROSCOPIC BILATERAL SALPINGO OOPHERECTOMY Right 06/07/2014   Procedure: LAPAROSCOPIC RIGHT SALPINGO OOPHORECTOMY/COLLECTION OF PELVIC WASHINGS;  Surgeon: Megan Salon, MD;  Location: [facility];  Service: Gynecology;  Laterality: Right;  . LAPAROSCOPIC UNILATERAL SALPINGECTOMY Left 06/07/2014   Procedure: LAPAROSCOPIC UNILATERAL SALPINGECTOMY;  Surgeon: Megan Salon, MD;  Location: [facility];  Service: Gynecology;  Laterality: Left;  . ORIF ANKLE FRACTURE  Right 06/07/2013   Procedure: OPEN REDUCTION INTERNAL FIXATION (ORIF) ANKLE FRACTURE  FIBULA SYNDESMOSIS;  Surgeon: Ninetta Lights, MD;  Location: [facility];  Service: Orthopedics;  Laterality: Right;     FAMILY HISTORY:  Family History  Problem Relation Age of Onset  . Hypertension Mother   . Infertility Mother   . Deep vein thrombosis Mother   . Anxiety disorder Mother   . Depression Mother   . Hepatitis C Mother         blood transfusion  . Aneurysm Mother        [place]  . Cancer Father        gallbladder, renal  . Hypertension Father   . Anxiety disorder Father   . Depression Father   . Multiple births Sister   . Cancer Sister        thyroid cancer  . Thyroid disease Sister   . Anxiety disorder Sister   . Depression Sister   . Breast cancer Sister   . Hypertension Brother   . Anxiety disorder Brother   . Depression Brother      SOCIAL HISTORY:  reports that she has never smoked. She has never used smokeless tobacco. She reports that she does not drink alcohol and does not use drugs.  The patient is married.  She lives in [place], she has a 10 year old child.  She is a Agricultural engineer.   ALLERGIES: Soy allergy, Celery oil, Eggs or egg-derived products, Hazel tree pollen [corylus], and Tape   MEDICATIONS:  Current Outpatient Medications  Medication Sig Dispense Refill  . acetaminophen (TYLENOL) 500 MG tablet Take 1,000 mg by mouth every 6 (six) hours as needed for headache.    . albuterol (PROVENTIL HFA;VENTOLIN HFA) 108 (90 Base) MCG/ACT inhaler Inhale 1 puff into the lungs every 6 (six) hours as needed for wheezing or shortness of breath.    Marland Kitchen atorvastatin (LIPITOR) 40 MG tablet Take 40 mg by mouth at bedtime.   1  . buPROPion (WELLBUTRIN XL) 300 MG 24 hr tablet Take 300 mg by mouth daily.    . Cholecalciferol (VITAMIN D3) 1.25 MG (50000 UT) TABS Take 1 tablet by mouth once a week.    . citalopram (CELEXA) 20 MG tablet Take 20 mg by mouth daily.    Marland Kitchen JARDIANCE 10 MG TABS tablet Take 10 mg by mouth daily.    Marland Kitchen levocetirizine (XYZAL) 5 MG tablet Take 5 mg by mouth daily.     . metFORMIN (GLUCOPHAGE-XR) 500 MG 24 hr tablet     . metoprolol (LOPRESSOR) 100 MG tablet Take 100 mg by mouth 2 (two) times daily.    . Multiple Vitamins-Minerals (HAIR VITAMINS PO) Take 1 tablet by mouth daily.    . phenylephrine (SUDAFED PE) 10 MG TABS tablet Take 10 mg by mouth every 6 (six) hours as needed  (for congestion).    Marland Kitchen spironolactone (ALDACTONE) 100 MG tablet Take 1 tablet (100 mg total) by mouth 2 (two) times daily. 180 tablet 3  . TRULICITY 1.5 IO/9.6EX SOPN     . UNABLE TO FIND Immunotherapy Allergy Shot. Injection to each arm once weekly on various day    . montelukast (SINGULAIR) 10 MG tablet Take 10 mg by mouth daily.  (Patient not taking: Reported on 03/21/2020)     No current facility-administered medications for this encounter.     REVIEW OF SYSTEMS: On review of systems, the patient reports that she is doing well overall. She reports that she  has been sore since her biopsy but denies any other significant complaints about the breast.  She states that she has been anxious and stressed out both by the holidays, her cancer diagnosis, her husband fell and injured his leg, and parenting a child who has been diagnosed with bipolar disorder.    PHYSICAL EXAM:  Wt Readings from Last 3 Encounters:  03/21/20 199 lb (90.3 kg)  06/08/19 199 lb 12.8 oz (90.6 kg)  04/24/18 194 lb (88 kg)   Temp Readings from Last 3 Encounters:  03/21/20 (!) 97.2 F (36.2 C) (Tympanic)  06/08/19 (!) 97.1 F (36.2 C) (Temporal)  09/26/17 97.9 F (36.6 C)   BP Readings from Last 3 Encounters:  03/21/20 (!) 105/54  06/08/19 114/70  04/24/18 112/62   Pulse Readings from Last 3 Encounters:  03/21/20 79  06/08/19 72  04/24/18 60    In general this is a well appearing caucasian female in no acute distress. She's alert and oriented x4 and appropriate throughout the examination. Cardiopulmonary assessment is negative for acute distress and she exhibits normal effort. Bilateral breast exam is deferred.    ECOG = 0  0 - Asymptomatic (Fully active, able to carry on all predisease activities without restriction)  1 - Symptomatic but completely ambulatory (Restricted in physically strenuous activity but ambulatory and able to carry out work of a light or sedentary nature. For example, light  housework, office work)  2 - Symptomatic, <50% in bed during the day (Ambulatory and capable of all self care but unable to carry out any work activities. Up and about more than 50% of waking hours)  3 - Symptomatic, >50% in bed, but not bedbound (Capable of only limited self-care, confined to bed or chair 50% or more of waking hours)  4 - Bedbound (Completely disabled. Cannot carry on any self-care. Totally confined to bed or chair)  5 - Death   Eustace Pen MM, Creech RH, Tormey DC, et al. [phone]). "Toxicity and response criteria of the [facility]". [place] Oncol. 5 (6): 649-55    LABORATORY DATA:  Lab Results  Component Value Date   WBC 8.3 09/24/2017   HGB 15.8 (H) 09/24/2017   HCT 49.5 (H) 09/24/2017   MCV 95.4 09/24/2017   PLT 313 09/24/2017   Lab Results  Component Value Date   NA 139 09/24/2017   K 4.3 09/24/2017   CL 107 09/24/2017   CO2 24 09/24/2017   Lab Results  Component Value Date   ALT 60 (H) 09/24/2017   AST 38 09/24/2017   ALKPHOS 71 09/24/2017   BILITOT 1.2 09/24/2017      RADIOGRAPHY: MM CLIP PLACEMENT RIGHT  Result Date: 03/09/2020 CLINICAL DATA:  Status post stereotactic biopsy for indeterminate calcifications in the upper-outer quadrant RIGHT breast EXAM: DIAGNOSTIC RIGHT MAMMOGRAM POST STEREOTACTIC BIOPSY COMPARISON:  Previous exam(s). FINDINGS: Mammographic images were obtained following stereotactic guided biopsy of indeterminate calcifications within the upper-outer quadrant of the RIGHT breast. The biopsy marking clip is in expected position at the site of biopsy. IMPRESSION: Appropriate positioning of the coil shaped biopsy marking clip at the site of biopsy in the upper-outer quadrant the RIGHT breast at posterior depth. Final Assessment: Post Procedure Mammograms for Marker Placement Electronically Signed   By: Franki Cabot M.D.   On: 03/09/2020 12:12   MM CLIP PLACEMENT RIGHT  Result Date: 02/29/2020 CLINICAL DATA:   Confirmation of clip placement after ultrasound-guided core needle biopsy of a 6 mm mass involving  the UPPER OUTER QUADRANT of the RIGHT breast at 10 o'clock position approximately 5 cm from nipple. EXAM: 2D AND TOMOSYNTHESIS DIAGNOSTIC RIGHT MAMMOGRAM POST ULTRASOUND BIOPSY COMPARISON:  Previous exam(s). FINDINGS: Tomosynthesis and synthesized full field CC and mediolateral images were obtained following ultrasound guided biopsy of a mass involving the RIGHT breast. The ribbon shaped tissue marking clip is appropriately position at the MEDIAL margin of the biopsied mass. I note that there are fine pleomorphic calcifications which extend in a segmental distribution approximately 3 6 cm posterosuperior to the biopsied mass and clip. Expected post biopsy changes are present without evidence of hematoma. IMPRESSION: 1. Appropriate positioning of the ribbon shaped tissue marking clip at the MEDIAL margin of the biopsied mass in the UPPER OUTER QUADRANT of the RIGHT breast. 2. Fine pleomorphic calcifications in a somewhat segmental distribution which extend approximately 3.6 cm posterosuperior to the biopsied mass and clip. Final Assessment: Post Procedure Mammograms for Marker Placement Electronically Signed   By: Evangeline Dakin M.D.   On: 02/29/2020 13:57   MM RT BREAST BX W LOC DEV 1ST LESION IMAGE BX SPEC STEREO GUIDE  Addendum Date: 03/12/2020   ADDENDUM REPORT: 03/10/2020 14:06 ADDENDUM: Pathology revealed COLUMNAR CELL HYPERPLASIA WITH CALCIFICATIONS of the Right breast, posterior, 10 o'clock. This was found to be concordant by Dr. Franki Cabot. Pathology results were discussed with the patient by telephone. The patient reported doing well after the biopsy with tenderness at the site. Post biopsy instructions and care were reviewed and questions were answered. The patient was encouraged to call [facility] for any additional concerns. My direct phone number was provided. The  patient has a recent diagnosis of Right breast cancer and is scheduled to see Dr. Autumn Messing at [facility] on March 14, 2020. Pathology results reported by Terie Purser, RN on 03/10/2020. Electronically Signed   By: Franki Cabot M.D.   On: 03/10/2020 14:06   Result Date: 03/12/2020 CLINICAL DATA:  Recent biopsy-proven IDC and DCIS within the RIGHT breast at the 10 o'clock axis. On the post clip film, additional calcifications were identified in a segmental distribution extending 3.6 cm posterior-superior to the biopsied mass/clip. Patient presents today for stereotactic biopsy of these calcifications. EXAM: RIGHT BREAST STEREOTACTIC CORE NEEDLE BIOPSY COMPARISON:  Previous exams. FINDINGS: The patient and I discussed the procedure of stereotactic-guided biopsy including benefits and alternatives. We discussed the high likelihood of a successful procedure. We discussed the risks of the procedure including infection, bleeding, tissue injury, clip migration, and inadequate sampling. Informed written consent was given. The usual time out protocol was performed immediately prior to the procedure. Using sterile technique and 1% Lidocaine as local anesthetic, under stereotactic guidance, a 9 gauge vacuum assisted device was used to perform core needle biopsy of calcifications in the upper outer quadrant of the RIGHT breast using a lateral approach. Specimen radiograph was performed showing calcification. Specimens with calcifications are identified for pathology. Lesion quadrant: Upper outer quadrant At the conclusion of the procedure, coil shaped tissue marker clip was deployed into the biopsy cavity. Follow-up 2-view mammogram was performed and dictated separately. IMPRESSION: Stereotactic-guided biopsy of indeterminate calcifications within the upper-outer quadrant of the RIGHT breast. No apparent complications. Electronically Signed: By: Franki Cabot M.D. On: 03/09/2020 12:04   [place] RT BREAST BX W  LOC DEV 1ST LESION IMG BX SPEC US GUIDE  Addendum Date: 03/02/2020   ADDENDUM REPORT: 03/02/2020 08:47 ADDENDUM: Pathology revealed GRADE I INVASIVE DUCTAL CARCINOMA, DUCTAL  CARCINOMA IN SITU of the RIGHT breast, 10 o'clock, 5cmfn, upper outer quadrant, ribbon clip. This was found to be concordant by Dr. Peggye Fothergill. Pathology results were discussed with the patient by telephone. The patient reported doing well after the biopsy with tenderness at the site. Post biopsy instructions and care were reviewed and questions were answered. The patient was encouraged to call [facility] for any additional concerns. Surgical consultation has been arranged with Dr. Autumn Messing at [facility] on March 14, 2020. There are fine heterogeneous calcifications in a somewhat segmental distribution that extend 3.6 cm posterosuperior to the biopsied mass/clip. Patient is scheduled for RIGHT breast stereo-guided biopsy of these calcifications on March 09, 2020 at 11:30. Pathology results reported by Stacie Acres RN on 03/02/2020. Electronically Signed   By: Evangeline Dakin M.D.   On: 03/02/2020 08:47   Result Date: 03/02/2020 CLINICAL DATA:  54 year old with a screening detected suspicious 6 mm mass involving the UPPER OUTER QUADRANT of the RIGHT breast at 10 o'clock position 5 cm from the nipple. EXAM: ULTRASOUND GUIDED RIGHT BREAST CORE NEEDLE BIOPSY COMPARISON:  Previous exam(s). PROCEDURE: I met with the patient and we discussed the procedure of ultrasound-guided biopsy, including benefits and alternatives. We discussed the high likelihood of a successful procedure. We discussed the risks of the procedure, including infection, bleeding, tissue injury, clip migration, and inadequate sampling. Informed written consent was given. The usual time-out protocol was performed immediately prior to the procedure. Lesion quadrant: UPPER OUTER QUADRANT. Using sterile technique with chlorhexidine  as skin antisepsis 1% lidocaine and 1% lidocaine with epinephrine as local anesthetic, under direct ultrasound visualization, a 12 gauge Bard Marquee core needle device placed through an 11 gauge introducer needle was used to perform biopsy of the mass in the UPPER OUTER QUADRANT of the RIGHT breast using a lateral approach. At the conclusion of the procedure a ribbon shaped tissue marker clip was deployed into the biopsy cavity. Follow up 2 view mammogram was performed and dictated separately. IMPRESSION: Ultrasound guided biopsy of a mass involving the UPPER OUTER QUADRANT of the RIGHT breast. No apparent complications. Electronically Signed: By: Evangeline Dakin M.D. On: 02/29/2020 13:50       IMPRESSION/PLAN: 1. StageIA, [id number] grade 1, ER/PR positive invasive ductal carcinoma of the right breast. Dr. Lisbeth Renshaw discusses the pathology findings and reviews the nature of right breast disease. The consensus from the breast conference includes breast conservation with lumpectomy with  sentinel node biopsy. Depending on the size of the final tumor measurements rendered by pathology, the tumor may be tested for Oncotype Dx score to determine a role for systemic therapy. Provided that chemotherapy is not indicated, the patient's course would then be followed by external radiotherapy to the breast  to reduce risks of local recurrence followed by antiestrogen therapy. We discussed the risks, benefits, short, and long term effects of radiotherapy, as well as the curative intent, and the patient is interested in proceeding. Dr. Lisbeth Renshaw discusses the delivery and logistics of radiotherapy and anticipates a course of 6 1/2 weeks of radiotherapy. We will see her back a few weeks after surgery to discuss the simulation process and anticipate we starting radiotherapy about 4-6 weeks after surgery.   In a visit lasting 60 minutes, greater than 50% of the time was spent face to face reviewing her case, as well as in  preparation of, discussing, and coordinating the patient's care.  The above documentation reflects my direct findings during  this shared patient visit. Please see the separate note by Dr. Lisbeth Renshaw on this date for the remainder of the patient's plan of care.    Carola Rhine, PAC

## 2020-03-21 NOTE — Assessment & Plan Note (Signed)
02/29/2020: Screening detected right breast cancer 0.6 cm at 10 o'clock position with multiple benign cysts.  Biopsy 02/29/2020: Grade 1 IDC with DCIS ER/PR greater than 95%, Ki-67: 5%, HER-2 negative T1BN0 stage Ia  Pathology and radiology counseling:Discussed with the patient, the details of pathology including the type of breast cancer,the clinical staging, the significance of ER, PR and HER-2/neu receptors and the implications for treatment. After reviewing the pathology in detail, we proceeded to discuss the different treatment options between surgery, radiation, chemotherapy, antiestrogen therapies.  Recommendations: 1. Breast conserving surgery followed by 2. Oncotype DX testing will probably not need to be done since the tumor size is small and it is grade 1. 3. Adjuvant radiation therapy followed by 4. Adjuvant antiestrogen therapy   Return to clinic after surgery to discuss final pathology report and then determine if Oncotype DX testing will need to be sent.

## 2020-03-22 ENCOUNTER — Encounter: Payer: Self-pay | Admitting: *Deleted

## 2020-03-22 ENCOUNTER — Telehealth: Payer: Self-pay | Admitting: *Deleted

## 2020-03-22 ENCOUNTER — Telehealth: Payer: Self-pay | Admitting: Hematology and Oncology

## 2020-03-22 DIAGNOSIS — J301 Allergic rhinitis due to pollen: Secondary | ICD-10-CM | POA: Diagnosis not present

## 2020-03-22 DIAGNOSIS — J3089 Other allergic rhinitis: Secondary | ICD-10-CM | POA: Diagnosis not present

## 2020-03-22 DIAGNOSIS — J3081 Allergic rhinitis due to animal (cat) (dog) hair and dander: Secondary | ICD-10-CM | POA: Diagnosis not present

## 2020-03-22 MED FILL — JARDIANCE 10 MG TABLET: 10 | 30 days supply | Qty: 30 | Fill #4

## 2020-03-22 MED FILL — ATORVASTATIN 40 MG TABLET: 40 | 90 days supply | Qty: 90 | Fill #2

## 2020-03-22 NOTE — Telephone Encounter (Signed)
Scheduled follow-up appointment. Patient is aware. 

## 2020-03-22 NOTE — Telephone Encounter (Signed)
Called and spoke with patient to follow up from her new patient appt and discuss navigation resources. Denies any questions or concerns at this time. Encouraged her to call should anything arise. Contact information given.

## 2020-03-27 ENCOUNTER — Encounter: Payer: Self-pay | Admitting: Licensed Clinical Social Worker

## 2020-03-27 ENCOUNTER — Ambulatory Visit: Payer: 59 | Attending: General Surgery | Admitting: Physical Therapy

## 2020-03-27 ENCOUNTER — Encounter: Payer: Self-pay | Admitting: Physical Therapy

## 2020-03-27 ENCOUNTER — Other Ambulatory Visit: Payer: Self-pay

## 2020-03-27 DIAGNOSIS — R293 Abnormal posture: Secondary | ICD-10-CM | POA: Diagnosis not present

## 2020-03-27 NOTE — Therapy (Signed)
[facility] [place], [place], [address] Phone: [phone]   Fax:  [phone]  Physical Therapy Evaluation  Patient Details  Name: Nicole Khan MRN: [id number] Date of Birth: 31-Jul-1965 Referring Provider (PT): Dr. [name]   Encounter Date: 03/27/2020   PT End of Session - 03/27/20 1314    Visit Number 1    Number of Visits 3    Date for PT Re-Evaluation 05/29/20    PT Start Time 0915    PT Stop Time 0945    PT Time Calculation (min) 30 min    Activity Tolerance Patient tolerated treatment well    Behavior During Therapy [facility] for tasks assessed/performed           Past Medical History:  Diagnosis Date  . Abnormal mammogram of left breast 09/26/2017  . Abnormal Pap smear of cervix   . Anxiety   . Depression   . Diabetes mellitus without complication ([place])    type 2  . Fracture dislocation of right ankle joint 06-04-2013  . Fracture of distal fibula 06-04-2013  . H/O bilateral salpingectomy 05/2015  . History of kidney stones    passed stones, no surgery required  . Hypercholesteremia   . Hypertension   . Polycystic ovary disease   . PONV (postoperative nausea and vomiting)   . Seasonal allergies   . Sleep apnea 05/2017  . SVD (spontaneous vaginal delivery)    x 1    Past Surgical History:  Procedure Laterality Date  . BREAST BIOPSY Right 02/29/2020  . BREAST EXCISIONAL BIOPSY Left 2019   Benign  . BREAST LUMPECTOMY WITH RADIOACTIVE SEED LOCALIZATION Left 09/26/2017   Procedure: LEFT BREAST LUMPECTOMY WITH RADIOACTIVE SEED LOCALIZATION ERAS PATHWAY;  Surgeon: Fanny Skates, MD;  Location: [facility];  Service: General;  Laterality: Left;  . CARPAL TUNNEL RELEASE     bilateral  . CERVICAL CERCLAGE  07/1991; 05/1999   x 2  . CESAREAN SECTION     x 1  . CHOLECYSTECTOMY, LAPAROSCOPIC  1996  . COLONOSCOPY WITH PROPOFOL N/A 06/24/2017   Procedure: COLONOSCOPY WITH PROPOFOL;   Surgeon: Juanita Craver, MD;  Location: [facility];  Service: Endoscopy;  Laterality: N/A;  . CYSTOSCOPY N/A 06/07/2014   Procedure: CYSTOSCOPY;  Surgeon: Megan Salon, MD;  Location: [facility];  Service: Gynecology;  Laterality: N/A;  . LAPAROSCOPIC BILATERAL SALPINGO OOPHERECTOMY Right 06/07/2014   Procedure: LAPAROSCOPIC RIGHT SALPINGO OOPHORECTOMY/COLLECTION OF PELVIC WASHINGS;  Surgeon: Megan Salon, MD;  Location: [facility];  Service: Gynecology;  Laterality: Right;  . LAPAROSCOPIC UNILATERAL SALPINGECTOMY Left 06/07/2014   Procedure: LAPAROSCOPIC UNILATERAL SALPINGECTOMY;  Surgeon: Megan Salon, MD;  Location: [facility];  Service: Gynecology;  Laterality: Left;  . ORIF ANKLE FRACTURE Right 06/07/2013   Procedure: OPEN REDUCTION INTERNAL FIXATION (ORIF) ANKLE FRACTURE  FIBULA SYNDESMOSIS;  Surgeon: Ninetta Lights, MD;  Location: [facility];  Service: Orthopedics;  Laterality: Right;    There were no vitals filed for this visit.    Subjective Assessment - 03/27/20 1302    Subjective Pt is her for preop assessment prior to right lumpectomy and sentinel node biopsy scheduled for Jan 20    Pertinent History right breast cancer diagnosed with biopsy on 02/29/20 showed invasive ductal carcinoma with DCIS, grade 1, HER-2 negative (1+), ER/PR >95%, Ki 67 5%. Surgery is scheduled for Jan 20 and she will have radiation.  Past history includes, DM, sleep apnea, bilaterl carpal tunnel surgery(  2006) and right trigger finger surgery ( 2011)    Currently in Pain? No/denies              [facility] PT Assessment - 03/27/20 0001      Assessment   Medical Diagnosis right breast cancer    Referring Provider (PT) Dr. [name]    Onset Date/Surgical Date 02/29/20    Hand Dominance Right    Prior Therapy none      Precautions   Precautions Other (comment)    Precaution Comments active cancer      Restrictions   Weight Bearing Restrictions No      Balance Screen   Has the patient fallen in the past  6 months No    Has the patient had a decrease in activity level because of a fear of falling?  No    Is the patient reluctant to leave their home because of a fear of falling?  No      Home Environment   Living Environment Private residence    Living Arrangements Spouse/significant other;Children    Available Help at Discharge Available 24 hours/day      Prior Function   Level of Independence Independent    Vocation Unemployed    Leisure does not exercise, but wants to do yoga      Cognition   Overall Cognitive Status Within Functional Limits for tasks assessed      Observation/Other Assessments   Observations pt walks into clinic without device      Coordination   Gross Motor Movements are Fluid and Coordinated Yes      Posture/Postural Control   Posture/Postural Control Postural limitations    Postural Limitations Rounded Shoulders;Forward head    Posture Comments large abdomen body habitus      ROM / Strength   AROM / PROM / Strength AROM;Strength      AROM   AROM Assessment Site Shoulder    Right/Left Shoulder Right;Left    Right Shoulder Extension 55 Degrees    Right Shoulder Flexion 160 Degrees    Right Shoulder ABduction 170 Degrees    Right Shoulder Internal Rotation 60 Degrees    Right Shoulder External Rotation 90 Degrees    Left Shoulder Extension 55 Degrees    Left Shoulder Flexion 160 Degrees    Left Shoulder ABduction 170 Degrees    Left Shoulder Internal Rotation 60 Degrees    Left Shoulder External Rotation 90 Degrees      Strength   Overall Strength Within functional limits for tasks performed             LYMPHEDEMA/ONCOLOGY QUESTIONNAIRE - 03/27/20 0001      Type   Cancer Type right breast      Right Upper Extremity Lymphedema   10 cm Proximal to Olecranon Process 36 cm    Olecranon Process 26.5 cm    15 cm Proximal to Ulnar Styloid Process 28.8 cm    Just Proximal to Ulnar Styloid Process 16 cm    Across Hand at [employer] 19.5 cm     At [place] of 2nd Digit 6.5 cm      Left Upper Extremity Lymphedema   10 cm Proximal to Olecranon Process 37 cm    Olecranon Process 27 cm    15 cm Proximal to Ulnar Styloid Process 28.3 cm    Just Proximal to Ulnar Styloid Process 17 cm    Across Hand at [employer] 19.5 cm    At  Base of 2nd Digit 6.5 cm           L-DEX FLOWSHEETS - 03/27/20 1300      L-DEX LYMPHEDEMA SCREENING   Measurement Type Unilateral    L-DEX MEASUREMENT EXTREMITY Upper Extremity    POSITION  Standing    DOMINANT SIDE Right    At Risk Side Right    BASELINE SCORE (UNILATERAL) 1.8                  Objective measurements completed on examination: See above findings.                     Breast Clinic Goals - 03/27/20 1319      Patient will be able to verbalize understanding of pertinent lymphedema risk reduction practices relevant to her diagnosis specifically related to skin care.   Time 2    Period Months    Status New      Patient will be able to return demonstrate and/or verbalize understanding of the post-op home exercise program related to regaining shoulder range of motion.   Time 2    Period Months    Status New      Patient will be able to verbalize understanding of the importance of attending the postoperative After Breast Cancer Class for further lymphedema risk reduction education and therapeutic exercise.   Time 2    Period Days    Status Achieved                 Plan - 03/27/20 1315    Clinical Impression Statement Pt comes to PT for preop screening to get baselines for shoulder ROM and L-Dex scores.  She was instructed in post op exericses and about ABC class. She was scheduled for a post op visit 3 weeks after surgery and follow up [facility] screens  She understood all exercises amd will call if she has futher questions.    Personal Factors and Comorbidities Comorbidity 2    Comorbidities DM, previous hand surgery    Stability/Clinical Decision  Making Stable/Uncomplicated    Clinical Decision Making Low    Rehab Potential Excellent    PT Frequency Other (comment)   2 visits and will reassess post op   PT Treatment/Interventions Therapeutic exercise;ADLs/Self Care Home Management;Patient/family education;Manual techniques    PT Next Visit Plan reassess post op for shoulder ROM and lymphedema    PT Home Exercise Plan post op exericse    Consulted and Agree with Plan of Care Patient           Patient will follow up at outpatient cancer rehab 3-4 weeks following surgery.  If the patient requires physical therapy at that time, a specific plan will be dictated and sent to the referring physician for approval. The patient was educated today on appropriate basic range of motion exercises to begin post operatively and the importance of attending the After Breast Cancer class following surgery.  Patient was educated today on lymphedema risk reduction practices as it pertains to recommendations that will benefit the patient immediately following surgery.  She verbalized good understanding.    The patient was assessed using the L-Dex machine today to produce a lymphedema index baseline score. The patient will be reassessed on a regular basis (typically every 3 months) to obtain new L-Dex scores. If the score is > 6.5 points away from his/her baseline score indicating onset of subclinical lymphedema, it will be recommended to wear a compression garment for 4  weeks, 12 hours per day and then be reassessed. If the score continues to be > 6.5 points from baseline at reassessment, we will initiate lymphedema treatment. Assessing in this manner has a 95% rate of preventing clinically significant lymphedema. Patient will benefit from skilled therapeutic intervention in order to improve the following deficits and impairments:     Visit Diagnosis: Abnormal posture - Plan: PT plan of care cert/re-cert     Problem List Patient Active Problem List    Diagnosis Date Noted  . Malignant neoplasm of upper-outer quadrant of right breast in female, estrogen receptor positive ([place]) 03/21/2020  . Abnormal mammogram of left breast 09/26/2017  . Bilateral ovarian cysts 05/23/2014  . Polycystic ovary disease   . Fracture of distal fibula 06/04/2013  . HTN (hypertension) 04/07/2013  . PCOS (polycystic ovarian syndrome) 04/07/2013  . Diabetes ([place]) 04/07/2013  . Renal calculi 04/07/2013  . Adjustment disorder with mixed anxiety and depressed mood 04/07/2013   Donato Heinz. Owens Shark PT  Norwood Levo 03/27/2020, 1:23 PM  [place], [place], [address] Phone: [phone]   Fax:  [phone]  Name: Nicole Khan MRN: [id number] Date of Birth: 25-Sep-1965

## 2020-03-27 NOTE — Patient Instructions (Signed)

## 2020-03-27 NOTE — Progress Notes (Signed)
[facility] Clinical Social Work  Initial Assessment   Neri Vieyra Flaharty is a 54 y.o. year old female contacted by phone. Clinical Social Work was referred by new patient protocol for assessment of psychosocial needs.   SDOH (Social Determinants of Health) assessments performed: Yes SDOH Interventions   Flowsheet Row Most Recent Value  SDOH Interventions   Food Insecurity Interventions Intervention Not Indicated  Financial Strain Interventions Intervention Not Indicated  Housing Interventions Intervention Not Indicated  Transportation Interventions Intervention Not Indicated      Distress Screen completed: No No flowsheet data found.    Family/Social Information:  . Housing Arrangement: patient lives with husband and daughter . Older daughter lives in [place] . Family members/support persons in your life? Family (husband, daughters), sister, friends . Transportation concerns: no  . Employment: Futures trader. Income source: Spouse's income . Financial concerns: No o Type of concern: None . Food access concerns: no . Services Currently in place:  n/a  Coping/ Adjustment to diagnosis: . Patient understands treatment plan and what happens next? yes . Concerns about diagnosis and/or treatment: General adjustment to cancer and stress this time of year . Patient reported stressors: Adjusting to my illness . Patient enjoys sewing/ knitting, reading and baking . Current coping skills/ strengths: Capable of independent living, Motivation for treatment/growth, Special hobby/interest and Supportive family/friends    SUMMARY: Current SDOH Barriers:  . No significant SDOH barriers noted today  Clinical Social Work Clinical Goal(s):  Marland Kitchen No social work goals at this time  Interventions: . Discussed the importance of support during treatment . Informed patient of the support team roles and support services at [facility] . Provided CSW contact information and encouraged patient to call with any  questions or concerns   Follow Up Plan: Patient will contact CSW with any support or resource needs Patient verbalizes understanding of plan: Yes    Merlyn Albert , LCSW

## 2020-04-06 DIAGNOSIS — J3089 Other allergic rhinitis: Secondary | ICD-10-CM | POA: Diagnosis not present

## 2020-04-06 DIAGNOSIS — J3081 Allergic rhinitis due to animal (cat) (dog) hair and dander: Secondary | ICD-10-CM | POA: Diagnosis not present

## 2020-04-06 DIAGNOSIS — J301 Allergic rhinitis due to pollen: Secondary | ICD-10-CM | POA: Diagnosis not present

## 2020-04-06 DIAGNOSIS — H5213 Myopia, bilateral: Secondary | ICD-10-CM | POA: Diagnosis not present

## 2020-04-10 ENCOUNTER — Other Ambulatory Visit: Payer: Self-pay | Admitting: Genetic Counselor

## 2020-04-10 ENCOUNTER — Other Ambulatory Visit: Payer: Self-pay

## 2020-04-10 ENCOUNTER — Inpatient Hospital Stay: Payer: 59 | Attending: Hematology and Oncology | Admitting: Genetic Counselor

## 2020-04-10 ENCOUNTER — Inpatient Hospital Stay: Payer: 59

## 2020-04-10 DIAGNOSIS — Z803 Family history of malignant neoplasm of breast: Secondary | ICD-10-CM

## 2020-04-10 DIAGNOSIS — Z170 Estrogen receptor positive status [ER+]: Secondary | ICD-10-CM

## 2020-04-10 DIAGNOSIS — C50411 Malignant neoplasm of upper-outer quadrant of right female breast: Secondary | ICD-10-CM

## 2020-04-10 DIAGNOSIS — Z808 Family history of malignant neoplasm of other organs or systems: Secondary | ICD-10-CM | POA: Diagnosis not present

## 2020-04-10 DIAGNOSIS — J3081 Allergic rhinitis due to animal (cat) (dog) hair and dander: Secondary | ICD-10-CM | POA: Diagnosis not present

## 2020-04-10 DIAGNOSIS — Z8042 Family history of malignant neoplasm of prostate: Secondary | ICD-10-CM | POA: Diagnosis not present

## 2020-04-10 DIAGNOSIS — J301 Allergic rhinitis due to pollen: Secondary | ICD-10-CM | POA: Diagnosis not present

## 2020-04-10 DIAGNOSIS — J3089 Other allergic rhinitis: Secondary | ICD-10-CM | POA: Diagnosis not present

## 2020-04-10 LAB — GENETIC SCREENING ORDER

## 2020-04-11 ENCOUNTER — Encounter: Payer: Self-pay | Admitting: Genetic Counselor

## 2020-04-11 DIAGNOSIS — Z808 Family history of malignant neoplasm of other organs or systems: Secondary | ICD-10-CM

## 2020-04-11 DIAGNOSIS — E1169 Type 2 diabetes mellitus with other specified complication: Secondary | ICD-10-CM | POA: Diagnosis not present

## 2020-04-11 DIAGNOSIS — E559 Vitamin D deficiency, unspecified: Secondary | ICD-10-CM | POA: Diagnosis not present

## 2020-04-11 DIAGNOSIS — I10 Essential (primary) hypertension: Secondary | ICD-10-CM | POA: Diagnosis not present

## 2020-04-11 DIAGNOSIS — F325 Major depressive disorder, single episode, in full remission: Secondary | ICD-10-CM | POA: Diagnosis not present

## 2020-04-11 DIAGNOSIS — Z8042 Family history of malignant neoplasm of prostate: Secondary | ICD-10-CM

## 2020-04-11 DIAGNOSIS — Z6836 Body mass index (BMI) 36.0-36.9, adult: Secondary | ICD-10-CM | POA: Diagnosis not present

## 2020-04-11 DIAGNOSIS — C50911 Malignant neoplasm of unspecified site of right female breast: Secondary | ICD-10-CM | POA: Diagnosis not present

## 2020-04-11 DIAGNOSIS — Z803 Family history of malignant neoplasm of breast: Secondary | ICD-10-CM

## 2020-04-11 HISTORY — DX: Family history of malignant neoplasm of other organs or systems: Z80.8

## 2020-04-11 HISTORY — DX: Family history of malignant neoplasm of prostate: Z80.42

## 2020-04-11 HISTORY — DX: Family history of malignant neoplasm of breast: Z80.3

## 2020-04-11 NOTE — Progress Notes (Signed)
REFERRING PROVIDER: Jovita Kussmaul, MD [address],  [place] [id number]  PRIMARY PROVIDER:  [name], MD  PRIMARY REASON FOR VISIT:  1. Malignant neoplasm of upper-outer quadrant of right breast in female, estrogen receptor positive ([place])   2. Family history of breast cancer   3. Family history of prostate cancer   4. Family history of thyroid cancer     HISTORY OF PRESENT ILLNESS:   Nicole Khan, a 55 y.o. female, was seen for a [facility] cancer genetics consultation at the request of Dr. [name] due to a personal and family history of cancer.  Nicole Khan presents to clinic today with her husband to discuss the possibility of a hereditary predisposition to cancer, to discuss genetic testing, and to further clarify her future cancer risks, as well as potential cancer risks for family members.   In November 2021, at the age of 46, Nicole Khan was diagnosed with invasive ductal carcinoma with ductal carcinoma in situ of the right breast (ER+/PR+/HER2-). The preliminary treatment plan includes lumpectomy, Oncotype to determine potential benefit of chemotherapy, adjuvant radiation, and adjuvant anti-estrogens.   CANCER HISTORY:  Oncology History  Malignant neoplasm of upper-outer quadrant of right breast in female, estrogen receptor positive ([place])  02/29/2020 Initial Diagnosis   Screening mammogram detected right breast mass. 0.6cm mass at the 10 o'clock position with multiple benign cysts. Biopsy on 02/29/20 showed invasive ductal carcinoma with DCIS, grade 1, HER-2 negative (1+), ER/PR >95%, Ki67: 5%.    03/21/2020 Cancer Staging   Staging form: Breast, AJCC 8th Edition - Clinical stage from 03/21/2020: Stage IA (cT1b, cN0, cM0, G1, ER+, PR+, HER2-) - Signed by Nicholas Lose, MD on 03/21/2020    RISK FACTORS:  Menarche was at age 55.  First live birth at age 42.  OCP use for approximately 10-15 years.  Ovaries intact: Left ovary intact; Right ovary and right and  left fallopian tube removed due to cysts.  Hysterectomy: no.  Menopausal status: postmenopausal.  HRT use: progesterone only for 4-5 years; stopped approximately 1 year ago years. Colonoscopy: yes; most recent in 2019; 4 polyps detected; to follow-up in 5 years. Mammogram within the last year: yes. Number of breast biopsies: 2. Up to date with pelvic exams: yes. Any excessive radiation exposure in the past: no  Past Medical History:  Diagnosis Date  . Abnormal mammogram of left breast 09/26/2017  . Abnormal Pap smear of cervix   . Anxiety   . Depression   . Diabetes mellitus without complication ([place])    type 2  . Family history of breast cancer 04/11/2020  . Family history of prostate cancer 04/11/2020  . Family history of thyroid cancer 04/11/2020  . Fracture dislocation of right ankle joint 06-04-2013  . Fracture of distal fibula 06-04-2013  . H/O bilateral salpingectomy 05/2015  . History of kidney stones    passed stones, no surgery required  . Hypercholesteremia   . Hypertension   . Polycystic ovary disease   . PONV (postoperative nausea and vomiting)   . Seasonal allergies   . Sleep apnea 05/2017  . SVD (spontaneous vaginal delivery)    x 1    Past Surgical History:  Procedure Laterality Date  . BREAST BIOPSY Right 02/29/2020  . BREAST EXCISIONAL BIOPSY Left 2019   Benign  . BREAST LUMPECTOMY WITH RADIOACTIVE SEED LOCALIZATION Left 09/26/2017   Procedure: LEFT BREAST LUMPECTOMY WITH RADIOACTIVE SEED LOCALIZATION ERAS PATHWAY;  Surgeon: Fanny Skates, MD;  Location:  [facility];  Service: General;  Laterality: Left;  . CARPAL TUNNEL RELEASE     bilateral  . CERVICAL CERCLAGE  07/1991; 05/1999   x 2  . CESAREAN SECTION     x 1  . CHOLECYSTECTOMY, LAPAROSCOPIC  1996  . COLONOSCOPY WITH PROPOFOL N/A 06/24/2017   Procedure: COLONOSCOPY WITH PROPOFOL;  Surgeon: Juanita Craver, MD;  Location: [facility];  Service: Endoscopy;  Laterality: N/A;  . CYSTOSCOPY N/A  06/07/2014   Procedure: CYSTOSCOPY;  Surgeon: Megan Salon, MD;  Location: [facility];  Service: Gynecology;  Laterality: N/A;  . LAPAROSCOPIC BILATERAL SALPINGO OOPHERECTOMY Right 06/07/2014   Procedure: LAPAROSCOPIC RIGHT SALPINGO OOPHORECTOMY/COLLECTION OF PELVIC WASHINGS;  Surgeon: Megan Salon, MD;  Location: [facility];  Service: Gynecology;  Laterality: Right;  . LAPAROSCOPIC UNILATERAL SALPINGECTOMY Left 06/07/2014   Procedure: LAPAROSCOPIC UNILATERAL SALPINGECTOMY;  Surgeon: Megan Salon, MD;  Location: [facility];  Service: Gynecology;  Laterality: Left;  . ORIF ANKLE FRACTURE Right 06/07/2013   Procedure: OPEN REDUCTION INTERNAL FIXATION (ORIF) ANKLE FRACTURE  FIBULA SYNDESMOSIS;  Surgeon: Ninetta Lights, MD;  Location: [facility];  Service: Orthopedics;  Laterality: Right;    Social History   Socioeconomic History  . Marital status: Married    Spouse name: Not on file  . Number of children: 2  . Years of education: Not on file  . Highest education level: Not on file  Occupational History  . Not on file  Tobacco Use  . Smoking status: Never Smoker  . Smokeless tobacco: Never Used  Vaping Use  . Vaping Use: Never used  Substance and Sexual Activity  . Alcohol use: No    Alcohol/week: 0.0 standard drinks  . Drug use: No  . Sexual activity: Yes    Partners: Male    Birth control/protection: Pill    Comment: [employer]ronor  Other Topics Concern  . Not on file  Social History Narrative  . Not on file   Social Determinants of Health   Financial Resource Strain: Low Risk   . Difficulty of Paying Living Expenses: Not hard at all  Food Insecurity: No Food Insecurity  . Worried About Charity fundraiser in the Last Year: Never true  . Ran Out of Food in the Last Year: Never true  Transportation Needs: No Transportation Needs  . Lack of Transportation (Medical): No  . Lack of Transportation (Non-Medical): No  Physical Activity: Not on file  Stress: Not on file  Social  Connections: Not on file     FAMILY HISTORY:  We obtained a detailed, 4-generation family history.  Significant diagnoses are listed below: Family History  Problem Relation Age of Onset  . Cancer Father        Gallbladder; dx 78s  . Breast cancer Sister 59  . Thyroid cancer Sister 64       unknown type  . Prostate cancer Paternal Uncle        dx 9s  . Lung cancer Half-Sister 15       maternal half sister; no smoking hx    Nicole Khan has two daughters, ages 64 and 24.  Nicole Khan has one full brother, one full sister, and one maternal half sister.  Her full sister was diagnosed with thyroid cancer at age 30 and breast cancer at age 31.  She reportedly had negative genetic testing (no report available for review today).  Nicole Khan maternal half sister was diagnosed with lung cancer at age  52.   Nicole Khan mother passed away at age 53 and did not have cancer.  No maternal family history of cancer was reported.  Nicole Khan reported that her maternal family had exposures related to the [place].   Nicole Khan father is 82 years old and has a history of gallbladder cancer diagnosed in his 75s.  Nicole Khan paternal uncle, age 79, was diagnosed with prostate cancer in his 12s. No other paternal family history of cancer was reported.   Nicole Khan is unaware of previous family history of genetic testing for hereditary cancer risks besides that mentioned above. Patient's maternal ancestors are of [place] descent, and paternal ancestors are of [place] descent. There is no reported Ashkenazi Jewish ancestry. There is no known consanguinity.  GENETIC COUNSELING ASSESSMENT: Nicole Khan is a 55 y.o. female with a personal and family history of cancer which is somewhat suggestive of a hereditary cancer syndrome and predisposition to cancer given the related diagnoses in her family. We, therefore, discussed and recommended the following at today's visit.   DISCUSSION: We  discussed that 5 - 10% of cancer is hereditary, with most cases of hereditary breast cancer associated with mutations in BRCA1/2.  There are other genes that can be associated with hereditary breast and prostate cancer syndromes.  Type of cancer risk and level of risk are gene-specific.  We discussed that testing is beneficial for several reasons including knowing how to follow individuals after completing their treatment, identifying whether potential treatment options would be beneficial, and understanding if other family members could be at risk for cancer and allowing them to undergo genetic testing.   We reviewed the characteristics, features and inheritance patterns of hereditary cancer syndromes. We also discussed genetic testing, including the appropriate family members to test, the process of testing, insurance coverage and turn-around-time for results. We discussed the implications of a negative, positive and/or variant of uncertain significant result. In order to get genetic test results in a timely manner so that Nicole Khan can use these genetic test results for surgical decisions, we recommended Nicole Khan pursue genetic testing for the [facility] Breast Cancer STAT Panel.  The STAT Breast cancer panel offered by [facility] includes sequencing and rearrangement analysis for the following 9 genes:  ATM, BRCA1, BRCA2, CDH1, CHEK2, PALB2, PTEN, STK11 and TP53.    Once complete, we recommend Nicole Khan pursue reflex genetic testing a more comprehensive panel.   Nicole Khan  was offered a common hereditary cancer panel (48 genes) and an expanded pan-cancer panel (85 genes). Nicole Khan was informed of the benefits and limitations of each panel, including that expanded pan-cancer panels contain genes that do not have clear management guidelines at this point in time.  We also discussed that as the number of genes included on a panel increases, the chances of variants of uncertain significance increases.   After considering the benefits and limitations of each gene panel, Nicole Khan  elected to have an expanded Radio broadcast assistant through [facility].  The Multi-Cancer Panel offered by [facility] includes sequencing and/or deletion duplication testing of the following 85 genes: AIP, ALK, APC, ATM, AXIN2,BAP1,  BARD1, BLM, BMPR1A, BRCA1, BRCA2, BRIP1, CASR, CDC73, CDH1, CDK4, CDKN1B, CDKN1C, CDKN2A (p14ARF), CDKN2A (p16INK4a), CEBPA, CHEK2, CTNNA1, DICER1, DIS3L2, EGFR (c.2369C>T, p.Thr790Met variant only), EPCAM (Deletion/duplication testing only), FH, FLCN, GATA2, GPC3, GREM1 (Promoter region deletion/duplication testing only), HOXB13 (c.251G>A, p.Gly84Glu), HRAS, KIT, MAX, MEN1, MET, MITF (c.952G>A, p.Glu318Lys variant only), MLH1, MSH2, MSH3, MSH6, MUTYH, NBN, NF1,  NF2, NTHL1, PALB2, PDGFRA, PHOX2B, PMS2, POLD1, POLE, POT1, PRKAR1A, PTCH1, PTEN, RAD50, RAD51C, RAD51D, RB1, RECQL4, RET, RNF43, RUNX1, SDHAF2, SDHA (sequence changes only), SDHB, SDHC, SDHD, SMAD4, SMARCA4, SMARCB1, SMARCE1, STK11, SUFU, TERC, TERT, TMEM127, TP53, TSC1, TSC2, VHL, WRN and WT1.   Based on Ms. Weber's personal and family history of cancer, she meets medical criteria for genetic testing. Despite that she meets criteria, she may still have an out of pocket cost. We discussed that if she has an out of pocket cost associated with testing, the laboratory will contact her to confirm whether she wants to proceed with testing.  If she does not respond to the laboratory, they will proceed with testing through insurance.   PLAN: After considering the risks, benefits, and limitations, Ms. Snodgrass provided informed consent to pursue genetic testing and the blood sample was sent to [facility] for analysis of the STAT+Multi-Cancer Panel. Results should be available within approximately 1-2 weeks' time, at which point they will be disclosed by telephone to Ms. Cotrell, as will any additional recommendations warranted by these results. Ms.  Khan will receive a summary of her genetic counseling visit and a copy of her results once available. This information will also be available in Epic.   Lastly, we encouraged Ms. Valtierra to remain in contact with cancer genetics annually so that we can continuously update the family history and inform her of any changes in cancer genetics and testing that may be of benefit for this family.   Ms. Pitt questions were answered to her satisfaction today. Our contact information was provided should additional questions or concerns arise. Thank you for the referral and allowing [place] to share in the care of your patient.   Zollie Clemence M. Joette Catching, [place], [facility] Genetic Counselor [email or web address] (P) [phone]  The patient was seen for a total of 35 minutes in face-to-face genetic counseling.  Drs. Magrinat, Lindi Adie and/or Burr Medico were available to discuss this case as needed.    _______________________________________________________________________ For Office Staff:  Number of people involved in session: 1 Was an Intern/ student involved with case: no

## 2020-04-12 MED FILL — TRULICITY 1.5 MG/0.5 ML PEN: 1.5 | 28 days supply | Qty: 2 | Fill #3

## 2020-04-13 ENCOUNTER — Ambulatory Visit: Payer: 59 | Attending: Internal Medicine

## 2020-04-13 DIAGNOSIS — Z23 Encounter for immunization: Secondary | ICD-10-CM

## 2020-04-13 NOTE — Progress Notes (Signed)
Left patient a VM regarding COVID testing date change from 04/17/20 to 04/18/20 due to possible inclement weather.

## 2020-04-13 NOTE — Progress Notes (Signed)
   Covid-19 Vaccination Clinic  Name:  Nicole Khan    MRN: [id number] DOB: 18-Jul-1965  04/13/2020  [name] was observed post Covid-19 immunization for 15 minutes without incident. She was provided with Vaccine Information Sheet and instruction to access the V-Safe system.   [name] was instructed to call 911 with any severe reactions post vaccine: Marland Kitchen Difficulty breathing  . Swelling of face and throat  . A fast heartbeat  . A bad rash all over body  . Dizziness and weakness   Immunizations Administered    Name Date Dose VIS Date Route   Pfizer COVID-19 Vaccine 04/13/2020  3:33 PM 0.3 mL 01/19/2020 Intramuscular   Manufacturer: [place]   Lot: [id number]   NDC: [address]-8

## 2020-04-17 ENCOUNTER — Encounter: Payer: Self-pay | Admitting: Genetic Counselor

## 2020-04-17 ENCOUNTER — Other Ambulatory Visit: Payer: Self-pay

## 2020-04-17 ENCOUNTER — Telehealth: Payer: Self-pay | Admitting: Genetic Counselor

## 2020-04-17 ENCOUNTER — Other Ambulatory Visit (HOSPITAL_COMMUNITY)
Admission: RE | Admit: 2020-04-17 | Discharge: 2020-04-17 | Disposition: A | Discharge status: Home / Self Care | Payer: 59 | Source: Ambulatory Visit | Attending: General Surgery | Admitting: General Surgery

## 2020-04-17 ENCOUNTER — Encounter (HOSPITAL_COMMUNITY)
Admission: RE | Admit: 2020-04-17 | Discharge: 2020-04-17 | Disposition: A | Discharge status: Home / Self Care | Payer: 59 | Source: Ambulatory Visit | Attending: General Surgery | Admitting: General Surgery

## 2020-04-17 ENCOUNTER — Encounter (HOSPITAL_COMMUNITY): Payer: Self-pay

## 2020-04-17 DIAGNOSIS — Z01818 Encounter for other preprocedural examination: Secondary | ICD-10-CM | POA: Diagnosis not present

## 2020-04-17 DIAGNOSIS — E119 Type 2 diabetes mellitus without complications: Secondary | ICD-10-CM | POA: Diagnosis not present

## 2020-04-17 DIAGNOSIS — Z0000 Encounter for general adult medical examination without abnormal findings: Secondary | ICD-10-CM | POA: Insufficient documentation

## 2020-04-17 DIAGNOSIS — Z20822 Contact with and (suspected) exposure to covid-19: Secondary | ICD-10-CM | POA: Insufficient documentation

## 2020-04-17 DIAGNOSIS — Z01812 Encounter for preprocedural laboratory examination: Secondary | ICD-10-CM | POA: Insufficient documentation

## 2020-04-17 DIAGNOSIS — Z1379 Encounter for other screening for genetic and chromosomal anomalies: Secondary | ICD-10-CM | POA: Insufficient documentation

## 2020-04-17 HISTORY — DX: Malignant (primary) neoplasm, unspecified: C80.1

## 2020-04-17 HISTORY — DX: Unspecified asthma, uncomplicated: J45.909

## 2020-04-17 LAB — BASIC METABOLIC PANEL
Anion gap: 10 (ref 5–15)
BUN: 14 mg/dL (ref 6–20)
CO2: 24 mmol/L (ref 22–32)
Calcium: 9.4 mg/dL (ref 8.9–10.3)
Chloride: 104 mmol/L (ref 98–111)
Creatinine, Ser: 1 mg/dL (ref 0.44–1.00)
GFR, Estimated: >60 mL/min (ref >60)
Glucose, Bld: 140 mg/dL — ABNORMAL HIGH (ref 70–99)
Potassium: 4.4 mmol/L (ref 3.5–5.1)
Sodium: 138 mmol/L (ref 135–145)

## 2020-04-17 LAB — CBC
HCT: 52 % — ABNORMAL HIGH (ref 36.0–46.0)
Hemoglobin: 16.7 g/dL — ABNORMAL HIGH (ref 12.0–15.0)
MCH: 31 pg (ref 26.0–34.0)
MCHC: 32.1 g/dL (ref 30.0–36.0)
MCV: 96.7 fL (ref 80.0–100.0)
Platelets: 307 10*3/uL (ref 150–400)
RBC: 5.38 MIL/uL — ABNORMAL HIGH (ref 3.87–5.11)
RDW: 13.2 % (ref 11.5–15.5)
WBC: 7.8 10*3/uL (ref 4.0–10.5)
nRBC: 0 % (ref 0.0–0.2)

## 2020-04-17 LAB — GLUCOSE, CAPILLARY: Glucose-Capillary: 156 mg/dL — ABNORMAL HIGH (ref 70–99)

## 2020-04-17 LAB — SARS CORONAVIRUS 2 (TAT 6-24 HRS): SARS Coronavirus 2: NEGATIVE

## 2020-04-17 NOTE — Progress Notes (Addendum)
PCP - Dr. [name]  EKG - today  CPAP - yes  CBG - 157 Checks Blood Sugar __0___ times a day Most recent A1C was 7.3 on 04/11/20  ERAS Protcol - no drink ordered  COVID TEST-  Scheduled for today   Anesthesia review: N0  Patient denies shortness of breath, fever, cough and chest pain at PAT appointment   All instructions explained to the patient, with a verbal understanding of the material. Patient agrees to go over the instructions while at home for a better understanding. Patient also instructed to self quarantine after being tested for COVID-19. The opportunity to ask questions was provided.

## 2020-04-17 NOTE — Telephone Encounter (Signed)
Contacted patient in attempt to disclose results of genetic testing.  LVM with contact information requesting a call back.  

## 2020-04-17 NOTE — Pre-Procedure Instructions (Signed)
Nicole Khan  04/17/2020    Your procedure is scheduled on Thursday, April 20, 2020 at 3:15 PM.   Report to [facility] Entrance "A" Admitting Office at 1:15 PM.   Call this number if you have problems the morning of surgery: [phone]   Questions prior to day of surgery, please call [phone] between 8 & 4 PM.   Remember:  Do not eat food after midnight Wednesday, 04/19/20.  You may drink clear liquids until 12:15 PM.  Clear liquids allowed are: Water, Juice (non-citric and without pulp - diabetics please choose diet or no sugar options), Carbonated beverages - (diabetics please choose diet or no sugar options), Clear Tea, Black Coffee only (no creamer, milk or cream including half and half) and Gatorade (diabetics please choose diet or no sugar options)    Take these medicines the morning of surgery with A SIP OF WATER: Bupropion (Wellbutrin), Citalopram (Celexa), Levocetirizine (Claritin), Metoprolol (Lopressor), Acetaminophen (Tylenol) - if needed, Azelastine (Astelin) nasal spray - if needed, Epinephrine (Epi-pen) - if needed  Stop Vitamins as of today. Do not use Aspirin containing products, NSAIDS (Ibuprofen, Aleve, etc), Fish Oil or Herbal medications prior to surgery.   WHAT DO I DO ABOUT MY DIABETES MEDICATION?  Hold your Jardiance Wednesday and Thursday. Do not take Metformin the day of surgery.  HOW TO MANAGE YOUR DIABETES BEFORE AND AFTER SURGERY  Why is it important to control my blood sugar before and after surgery? . Improving blood sugar levels before and after surgery helps healing and can limit problems. . A way of improving blood sugar control is eating a healthy diet by: o  Eating less sugar and carbohydrates o  Increasing activity/exercise o  Talking with your doctor about reaching your blood sugar goals . High blood sugars (greater than 180 mg/dL) can raise your risk of infections and slow your recovery, so you will need to focus on  controlling your diabetes during the weeks before surgery. . Make sure that the doctor who takes care of your diabetes knows about your planned surgery including the date and location.  How do I manage my blood sugar before surgery? . Check your blood sugar at least 4 times a day, starting 2 days before surgery, to make sure that the level is not too high or low. . Check your blood sugar the morning of your surgery when you wake up and every 2 hours until you get to the Short Stay unit. o If your blood sugar is less than 70 mg/dL, you will need to treat for low blood sugar: - Do not take insulin. - Treat a low blood sugar (less than 70 mg/dL) with  cup of clear juice (cranberry or apple), 4 glucose tablets, OR glucose gel. - Recheck blood sugar in 15 minutes after treatment (to make sure it is greater than 70 mg/dL). If your blood sugar is not greater than 70 mg/dL on recheck, call [phone] for further instructions. . Report your blood sugar to the short stay nurse when you get to Short Stay.  . If you are admitted to the hospital after surgery: o Your blood sugar will be checked by the staff and you will probably be given insulin after surgery (instead of oral diabetes medicines) to make sure you have good blood sugar levels. o The goal for blood sugar control after surgery is 80-180 mg/dL.    Do not wear jewelry, make-up or nail polish.  Do not wear lotions, powders, perfumes or  deodorant.  Do not shave 48 hours prior to surgery.    Do not bring valuables to the hospital.  [facility] is not responsible for any belongings or valuables.  Contacts, dentures or bridgework may not be worn into surgery.  Leave your suitcase in the car.  After surgery it may be brought to your room.  For patients admitted to the hospital, discharge time will be determined by your treatment team.  Patients discharged the day of surgery will not be allowed to drive home.   [facility] - Preparing for  Surgery  Before surgery, you can play an important role.  Because skin is not sterile, your skin needs to be as free of germs as possible.  You can reduce the number of germs on you skin by washing with CHG (chlorahexidine gluconate) soap before surgery.  CHG is an antiseptic cleaner which kills germs and bonds with the skin to continue killing germs even after washing.  Oral Hygiene is also important in reducing the risk of infection.  Remember to brush your teeth with your regular toothpaste the morning of surgery.  Please DO NOT use if you have an allergy to CHG or antibacterial soaps.  If your skin becomes reddened/irritated stop using the CHG and inform your nurse when you arrive at Short Stay.  Do not shave (including legs and underarms) for at least 48 hours prior to the first CHG shower.  You may shave your face.  Please follow these instructions carefully:   1.  Shower with CHG Soap the night before surgery and the morning of Surgery.  2.  If you choose to wash your hair, wash your hair first as usual with your normal shampoo.  3.  After you shampoo, rinse your hair and body thoroughly to remove the shampoo. 4.  Use CHG as you would any other liquid soap.  You can apply chg directly to the skin and wash gently with a      scrungie or washcloth.           5.  Apply the CHG Soap to your body ONLY FROM THE NECK DOWN.   Do not use on open wounds or open sores. Avoid contact with your eyes, ears, mouth and genitals (private parts).  Wash genitals (private parts) with your normal soap - do this prior to using CHG soap.  6.  Wash thoroughly, paying special attention to the area where your surgery will be performed.  7.  Thoroughly rinse your body with warm water from the neck down.  8.  DO NOT shower/wash with your normal soap after using and rinsing off the CHG Soap.  9.  Pat yourself dry with a clean towel.            10.  Wear clean pajamas.            11.  Place clean sheets on your bed  the night of your first shower and do not sleep with pets.  Day of Surgery  Shower as above. Do not apply any lotions/deodorants the morning of surgery.   Please wear clean clothes to the hospital. Remember to brush your teeth with toothpaste.  Please read over the following fact sheets that you were given.

## 2020-04-19 ENCOUNTER — Ambulatory Visit
Admission: RE | Admit: 2020-04-19 | Discharge: 2020-04-19 | Disposition: A | Discharge status: Home / Self Care | Payer: 59 | Source: Ambulatory Visit | Attending: General Surgery | Admitting: General Surgery

## 2020-04-19 ENCOUNTER — Other Ambulatory Visit: Payer: Self-pay

## 2020-04-19 DIAGNOSIS — Z170 Estrogen receptor positive status [ER+]: Secondary | ICD-10-CM

## 2020-04-19 DIAGNOSIS — C50411 Malignant neoplasm of upper-outer quadrant of right female breast: Secondary | ICD-10-CM | POA: Diagnosis not present

## 2020-04-20 ENCOUNTER — Ambulatory Visit (HOSPITAL_COMMUNITY)
Admission: RE | Admit: 2020-04-20 | Discharge: 2020-04-20 | Disposition: A | Discharge status: Home / Self Care | Payer: 59 | Attending: General Surgery | Admitting: General Surgery

## 2020-04-20 ENCOUNTER — Ambulatory Visit (HOSPITAL_COMMUNITY): Payer: 59 | Admitting: Physician Assistant

## 2020-04-20 ENCOUNTER — Other Ambulatory Visit: Payer: Self-pay

## 2020-04-20 ENCOUNTER — Encounter (HOSPITAL_COMMUNITY)
Admission: RE | Admit: 2020-04-20 | Discharge: 2020-04-20 | Disposition: A | Discharge status: Home / Self Care | Payer: 59 | Source: Ambulatory Visit | Attending: General Surgery | Admitting: General Surgery

## 2020-04-20 ENCOUNTER — Ambulatory Visit (HOSPITAL_COMMUNITY): Payer: 59

## 2020-04-20 ENCOUNTER — Ambulatory Visit (HOSPITAL_COMMUNITY): Payer: 59 | Admitting: Certified Registered"

## 2020-04-20 ENCOUNTER — Encounter (HOSPITAL_COMMUNITY): Payer: Self-pay | Admitting: General Surgery

## 2020-04-20 ENCOUNTER — Ambulatory Visit
Admission: RE | Admit: 2020-04-20 | Discharge: 2020-04-20 | Disposition: A | Discharge status: Home / Self Care | Payer: 59 | Source: Ambulatory Visit | Attending: General Surgery | Admitting: General Surgery

## 2020-04-20 ENCOUNTER — Encounter (HOSPITAL_COMMUNITY)
Admission: RE | Disposition: A | Discharge status: Home / Self Care | Payer: Self-pay | Source: Home / Self Care | Attending: General Surgery

## 2020-04-20 DIAGNOSIS — C50411 Malignant neoplasm of upper-outer quadrant of right female breast: Secondary | ICD-10-CM | POA: Insufficient documentation

## 2020-04-20 DIAGNOSIS — N6091 Unspecified benign mammary dysplasia of right breast: Secondary | ICD-10-CM | POA: Diagnosis not present

## 2020-04-20 DIAGNOSIS — Z803 Family history of malignant neoplasm of breast: Secondary | ICD-10-CM | POA: Insufficient documentation

## 2020-04-20 DIAGNOSIS — N6489 Other specified disorders of breast: Secondary | ICD-10-CM | POA: Insufficient documentation

## 2020-04-20 DIAGNOSIS — Z79899 Other long term (current) drug therapy: Secondary | ICD-10-CM | POA: Diagnosis not present

## 2020-04-20 DIAGNOSIS — C50911 Malignant neoplasm of unspecified site of right female breast: Secondary | ICD-10-CM | POA: Diagnosis not present

## 2020-04-20 DIAGNOSIS — N6011 Diffuse cystic mastopathy of right breast: Secondary | ICD-10-CM | POA: Diagnosis not present

## 2020-04-20 DIAGNOSIS — Z7984 Long term (current) use of oral hypoglycemic drugs: Secondary | ICD-10-CM | POA: Insufficient documentation

## 2020-04-20 DIAGNOSIS — I10 Essential (primary) hypertension: Secondary | ICD-10-CM | POA: Diagnosis not present

## 2020-04-20 DIAGNOSIS — Z170 Estrogen receptor positive status [ER+]: Secondary | ICD-10-CM | POA: Diagnosis not present

## 2020-04-20 DIAGNOSIS — G8918 Other acute postprocedural pain: Secondary | ICD-10-CM | POA: Diagnosis not present

## 2020-04-20 HISTORY — PX: BREAST LUMPECTOMY WITH RADIOACTIVE SEED AND SENTINEL LYMPH NODE BIOPSY: SHX6550

## 2020-04-20 LAB — GLUCOSE, CAPILLARY
Glucose-Capillary: 146 mg/dL — ABNORMAL HIGH (ref 70–99)
Glucose-Capillary: 107 mg/dL — ABNORMAL HIGH (ref 70–99)

## 2020-04-20 SURGERY — BREAST LUMPECTOMY WITH RADIOACTIVE SEED AND SENTINEL LYMPH NODE BIOPSY
Anesthesia: Regional | Site: Breast | Laterality: Right

## 2020-04-20 MED ORDER — OXYCODONE HCL 5 MG/5ML PO SOLN: 5.0000 mg | Freq: Once | ORAL | Status: DC | PRN

## 2020-04-20 MED ORDER — PROPOFOL 10 MG/ML IV BOLUS
INTRAVENOUS | Status: DC | PRN
  Administered 2020-04-20: 200 mg via INTRAVENOUS

## 2020-04-20 MED ORDER — ACETAMINOPHEN 500 MG PO TABS
1000.0000 mg | ORAL_TABLET | ORAL | Status: AC
  Administered 2020-04-20: 1000 mg via ORAL
  Filled 2020-04-20: qty 2

## 2020-04-20 MED ORDER — LACTATED RINGERS IV SOLN: INTRAVENOUS | Status: DC

## 2020-04-20 MED ORDER — DEXAMETHASONE SODIUM PHOSPHATE 10 MG/ML IJ SOLN
INTRAMUSCULAR | Status: AC
  Filled 2020-04-20: qty 1

## 2020-04-20 MED ORDER — DEXAMETHASONE SODIUM PHOSPHATE 10 MG/ML IJ SOLN
INTRAMUSCULAR | Status: DC | PRN
  Administered 2020-04-20: 5 mg via INTRAVENOUS

## 2020-04-20 MED ORDER — CHLORHEXIDINE GLUCONATE 0.12 % MT SOLN
15.0000 mL | Freq: Once | OROMUCOSAL | Status: AC
  Administered 2020-04-20: 15 mL via OROMUCOSAL
  Filled 2020-04-20: qty 15

## 2020-04-20 MED ORDER — PHENYLEPHRINE 40 MCG/ML (10ML) SYRINGE FOR IV PUSH (FOR BLOOD PRESSURE SUPPORT)
PREFILLED_SYRINGE | INTRAVENOUS | Status: AC
  Filled 2020-04-20: qty 10

## 2020-04-20 MED ORDER — FENTANYL CITRATE (PF) 100 MCG/2ML IJ SOLN
INTRAMUSCULAR | Status: AC
  Filled 2020-04-20: qty 2

## 2020-04-20 MED ORDER — ONDANSETRON HCL 4 MG/2ML IJ SOLN
INTRAMUSCULAR | Status: AC
  Filled 2020-04-20: qty 2

## 2020-04-20 MED ORDER — BUPIVACAINE HCL (PF) 0.25 % IJ SOLN
INTRAMUSCULAR | Status: AC
  Filled 2020-04-20: qty 30

## 2020-04-20 MED ORDER — BUPIVACAINE HCL (PF) 0.25 % IJ SOLN
INTRAMUSCULAR | Status: DC | PRN
  Administered 2020-04-20: 25 mL

## 2020-04-20 MED ORDER — ONDANSETRON HCL 4 MG/2ML IJ SOLN
INTRAMUSCULAR | Status: DC | PRN
  Administered 2020-04-20: 4 mg via INTRAVENOUS

## 2020-04-20 MED ORDER — TECHNETIUM TC 99M TILMANOCEPT KIT
1.0000 | PACK | Freq: Once | INTRAVENOUS | Status: AC | PRN
  Administered 2020-04-20: 1 via INTRADERMAL

## 2020-04-20 MED ORDER — OXYCODONE HCL 5 MG PO TABS
5.0000 mg | ORAL_TABLET | Freq: Once | ORAL | Status: DC | PRN
Start: 2020-04-20 — End: 2020-04-20

## 2020-04-20 MED ORDER — LIDOCAINE 2% (20 MG/ML) 5 ML SYRINGE
INTRAMUSCULAR | Status: DC | PRN
  Administered 2020-04-20: 40 mg via INTRAVENOUS

## 2020-04-20 MED ORDER — MIDAZOLAM HCL 2 MG/2ML IJ SOLN: 2.0000 mg | Freq: Once | INTRAMUSCULAR | Status: AC

## 2020-04-20 MED ORDER — PHENYLEPHRINE 40 MCG/ML (10ML) SYRINGE FOR IV PUSH (FOR BLOOD PRESSURE SUPPORT)
PREFILLED_SYRINGE | INTRAVENOUS | Status: DC | PRN
  Administered 2020-04-20: 120 ug via INTRAVENOUS
  Administered 2020-04-20 (×2): 80 ug via INTRAVENOUS
  Administered 2020-04-20: 120 ug via INTRAVENOUS
  Administered 2020-04-20: 160 ug via INTRAVENOUS

## 2020-04-20 MED ORDER — CELECOXIB 200 MG PO CAPS
200.0000 mg | ORAL_CAPSULE | ORAL | Status: AC
  Administered 2020-04-20: 200 mg via ORAL
  Filled 2020-04-20: qty 1

## 2020-04-20 MED ORDER — GABAPENTIN 300 MG PO CAPS
300.0000 mg | ORAL_CAPSULE | ORAL | Status: AC
  Administered 2020-04-20: 300 mg via ORAL
  Filled 2020-04-20: qty 1

## 2020-04-20 MED ORDER — CHLORHEXIDINE GLUCONATE CLOTH 2 % EX PADS: 6.0000 | MEDICATED_PAD | Freq: Once | CUTANEOUS | Status: DC

## 2020-04-20 MED ORDER — HYDROCODONE-ACETAMINOPHEN 5-325 MG PO TABS: 1.0000 | ORAL_TABLET | Freq: Four times a day (QID) | ORAL | 0 refills | Status: DC | PRN

## 2020-04-20 MED ORDER — ROPIVACAINE HCL 7.5 MG/ML IJ SOLN
INTRAMUSCULAR | Status: DC | PRN
  Administered 2020-04-20: 20 mL via PERINEURAL

## 2020-04-20 MED ORDER — LIDOCAINE 2% (20 MG/ML) 5 ML SYRINGE
INTRAMUSCULAR | Status: AC
  Filled 2020-04-20: qty 5

## 2020-04-20 MED ORDER — ORAL CARE MOUTH RINSE: 15.0000 mL | Freq: Once | OROMUCOSAL | Status: AC

## 2020-04-20 MED ORDER — EPHEDRINE SULFATE-NACL 50-0.9 MG/10ML-% IV SOSY
PREFILLED_SYRINGE | INTRAVENOUS | Status: DC | PRN
  Administered 2020-04-20: 15 mg via INTRAVENOUS
  Administered 2020-04-20: 10 mg via INTRAVENOUS
  Administered 2020-04-20: 15 mg via INTRAVENOUS
  Administered 2020-04-20: 10 mg via INTRAVENOUS

## 2020-04-20 MED ORDER — FENTANYL CITRATE (PF) 250 MCG/5ML IJ SOLN
INTRAMUSCULAR | Status: AC
  Filled 2020-04-20: qty 5

## 2020-04-20 MED ORDER — MIDAZOLAM HCL 2 MG/2ML IJ SOLN
INTRAMUSCULAR | Status: AC
  Administered 2020-04-20: 2 mg via INTRAVENOUS
  Filled 2020-04-20: qty 2

## 2020-04-20 MED ORDER — PROPOFOL 10 MG/ML IV BOLUS
INTRAVENOUS | Status: AC
  Filled 2020-04-20: qty 20

## 2020-04-20 MED ORDER — FENTANYL CITRATE (PF) 100 MCG/2ML IJ SOLN: 25.0000 ug | INTRAMUSCULAR | Status: DC | PRN

## 2020-04-20 MED ORDER — FENTANYL CITRATE (PF) 250 MCG/5ML IJ SOLN
INTRAMUSCULAR | Status: DC | PRN
  Administered 2020-04-20: 50 ug via INTRAVENOUS

## 2020-04-20 MED ORDER — CEFAZOLIN SODIUM-DEXTROSE 2-4 GM/100ML-% IV SOLN
2.0000 g | INTRAVENOUS | Status: AC
  Administered 2020-04-20: 2 g via INTRAVENOUS
  Filled 2020-04-20: qty 100

## 2020-04-20 MED ORDER — EPHEDRINE 5 MG/ML INJ
INTRAVENOUS | Status: AC
  Filled 2020-04-20: qty 10

## 2020-04-20 MED ORDER — PROMETHAZINE HCL 25 MG/ML IJ SOLN: 6.2500 mg | INTRAMUSCULAR | Status: DC | PRN

## 2020-04-20 MED FILL — HYDROCODON-APAP 5-325: 5-325 | 2 days supply | Qty: 15 | Fill #0

## 2020-04-20 SURGICAL SUPPLY — 35 items
APPLIER CLIP 9.375 MED OPEN (MISCELLANEOUS) ×2
BINDER BREAST LRG (GAUZE/BANDAGES/DRESSINGS) IMPLANT
BINDER BREAST XLRG (GAUZE/BANDAGES/DRESSINGS) ×2 IMPLANT
CANISTER SUCT 3000ML PPV (MISCELLANEOUS) ×2 IMPLANT
CHLORAPREP W/TINT 26 (MISCELLANEOUS) ×2 IMPLANT
CLIP APPLIE 9.375 MED OPEN (MISCELLANEOUS) ×1 IMPLANT
CNTNR URN SCR LID CUP LEK RST (MISCELLANEOUS) ×2 IMPLANT
CONT SPEC 4OZ STRL OR WHT (MISCELLANEOUS) ×4
COVER PROBE W GEL 5X96 (DRAPES) ×2 IMPLANT
COVER SURGICAL LIGHT HANDLE (MISCELLANEOUS) ×2 IMPLANT
COVER WAND RF STERILE (DRAPES) IMPLANT
DERMABOND ADVANCED (GAUZE/BANDAGES/DRESSINGS) ×1
DERMABOND ADVANCED .7 DNX12 (GAUZE/BANDAGES/DRESSINGS) ×1 IMPLANT
DEVICE DUBIN SPECIMEN MAMMOGRA (MISCELLANEOUS) ×2 IMPLANT
DRAPE CHEST BREAST 15X10 FENES (DRAPES) ×2 IMPLANT
ELECT COATED BLADE 2.86 ST (ELECTRODE) ×2 IMPLANT
ELECT REM PT RETURN 9FT ADLT (ELECTROSURGICAL) ×2
ELECTRODE REM PT RTRN 9FT ADLT (ELECTROSURGICAL) ×1 IMPLANT
GLOVE BIO SURGEON STRL SZ7.5 (GLOVE) ×4 IMPLANT
GOWN STRL REUS W/ TWL LRG LVL3 (GOWN DISPOSABLE) ×2 IMPLANT
GOWN STRL REUS W/TWL LRG LVL3 (GOWN DISPOSABLE) ×4
KIT BASIN OR (CUSTOM PROCEDURE TRAY) ×2 IMPLANT
KIT MARKER MARGIN INK (KITS) ×2 IMPLANT
LIGHT WAVEGUIDE WIDE FLAT (MISCELLANEOUS) IMPLANT
NEEDLE 18GX1X1/2 (RX/OR ONLY) (NEEDLE) IMPLANT
NEEDLE FILTER BLUNT 18X 1/2SAF (NEEDLE)
NEEDLE FILTER BLUNT 18X1 1/2 (NEEDLE) IMPLANT
NEEDLE HYPO 25GX1X1/2 BEV (NEEDLE) ×2 IMPLANT
NS IRRIG 1000ML POUR BTL (IV SOLUTION) ×2 IMPLANT
PACK GENERAL/GYN (CUSTOM PROCEDURE TRAY) ×2 IMPLANT
SUT MNCRL AB 4-0 PS2 18 (SUTURE) ×4 IMPLANT
SUT VIC AB 3-0 SH 18 (SUTURE) ×2 IMPLANT
SYR CONTROL 10ML LL (SYRINGE) ×2 IMPLANT
TOWEL GREEN STERILE (TOWEL DISPOSABLE) ×2 IMPLANT
TOWEL GREEN STERILE FF (TOWEL DISPOSABLE) ×2 IMPLANT

## 2020-04-20 NOTE — Anesthesia Postprocedure Evaluation (Signed)
Anesthesia Post Note  Patient: Nicole Khan  Procedure(s) Performed: RIGHT BREAST LUMPECTOMY WITH RADIOACTIVE SEED AND SENTINEL LYMPH NODE BIOPSY (Right Breast)     Patient location during evaluation: PACU Anesthesia Type: Regional and General Level of consciousness: awake and alert Pain management: pain level controlled Vital Signs Assessment: post-procedure vital signs reviewed and stable Respiratory status: spontaneous breathing, nonlabored ventilation, respiratory function stable and patient connected to nasal cannula oxygen Cardiovascular status: blood pressure returned to baseline and stable Postop Assessment: no apparent nausea or vomiting Anesthetic complications: no   No complications documented.  Last Vitals:  Vitals:   04/20/20 1710 04/20/20 1720  BP: 98/67 99/64  Pulse: 78 80  Resp: 20 20  Temp:  36.7 C  SpO2: 96% 95%    Last Pain:  Vitals:   04/20/20 1720  TempSrc:   PainSc: 0-No pain                 Analaura Messler P Latanya Hemmer

## 2020-04-20 NOTE — H&P (Signed)
Nicole Khan Appointment: 03/14/2020 8:30 AM Location: [facility] Patient #: [phone] DOB: 1965/07/21 Married / Language: English / Race: White Female   History of Present Illness  The patient is a 55 year old female who presents for a follow-up for Breast cancer. We are asked to see the patient in consultation by Dr. Juanita Craver to evaluate her for a new right breast cancer. The patient is a 55 year old white female who recently went for a routine screening mammogram. At that time she was found to have a 6 mm area of distortion in the upper outer quadrant of the right breast with clinically negative lymph nodes. There was also some calcification posterior to this. The mass was biopsied and came back as a invasive ductal cancer that was ER and PR positive and HER-2 negative with a Ki 67 of 5%. The calcifications came back as hyperplasia with no atypia. She does have a history of atypical duct hyperplasia in the left breast. She also has a family history of breast cancer in a sister. She does not smoke.   Past Surgical History  Breast Biopsy  Bilateral, Left. Cesarean Section - 1  Colon Polyp Removal - Colonoscopy  Foot Surgery  Right. Gallbladder Surgery - Laparoscopic   Diagnostic Studies History  Colonoscopy  1-5 years ago within last year Mammogram  within last year Pap Smear  1-5 years ago  Allergies  No Known Allergies   Medication History  Metoprolol Tartrate (100MG Tablet, Oral) Active. Atorvastatin Calcium (40MG Tablet, Oral) Active. BuPROPion HCl ER (XL) (300MG Tablet ER 24HR, Oral) Active. Citalopram Hydrobromide (20MG Tablet, Oral) Active. Spironolactone (100MG Tablet, Oral) Active. Vitamin D (Ergocalciferol) (50000UNIT Capsule, Oral) Active. Jardiance (10MG Tablet, Oral) Active. metFORMIN HCl ER (500MG Tablet ER 24HR, Oral) Active. Trulicity (2.9JJ/8.8CZ Soln Pen-inj, Subcutaneous) Active. Medications Reconciled  Social  History Caffeine use  Coffee, Tea. No alcohol use  No drug use  Tobacco use  Never smoker.  Family History  Alcohol Abuse  Family Members In General, Father. Anesthetic complications  Mother. Arthritis  Mother, Sister. Breast Cancer  Sister. Depression  Brother, Daughter, Father. Hypertension  Brother, Father, Mother, Sister. Malignant Neoplasm Of Pancreas  Father. Migraine Headache  Brother, Daughter, Father. Respiratory Condition  Sister. Thyroid problems  Sister.  Pregnancy / Birth History  Age at menarche  42 years, 10 years. Age of menopause  51-55 Contraceptive History  Oral contraceptives. Gravida  4 Irregular periods  Length (months) of breastfeeding  7-12 3-6 Maternal age  76-20 Para  3 2  Other Problems  Anxiety Disorder  Asthma  Cholelithiasis  Depression  Diabetes Mellitus  Gastric Ulcer  General anesthesia - complications  High blood pressure  Hypercholesterolemia  Kidney Stone  Lump In Breast  Migraine Headache  Oophorectomy  Right. Sleep Apnea     Review of Systems  General Present- Night Sweats. Not Present- Appetite Loss, Chills, Fatigue, Fever, Weight Gain and Weight Loss. Skin Not Present- Change in Wart/Mole, Dryness, Hives, Jaundice, New Lesions, Non-Healing Wounds, Rash and Ulcer. HEENT Present- Seasonal Allergies and Wears glasses/contact lenses. Not Present- Earache, Hearing Loss, Hoarseness, Nose Bleed, Oral Ulcers, Ringing in the Ears, Sinus Pain, Sore Throat, Visual Disturbances and Yellow Eyes. Respiratory Present- Snoring. Not Present- Bloody sputum, Chronic Cough, Difficulty Breathing and Wheezing. Breast Present- Breast Mass. Not Present- Breast Pain, Nipple Discharge and Skin Changes. Cardiovascular Not Present- Chest Pain, Difficulty Breathing Lying Down, Leg Cramps, Palpitations, Rapid Heart Rate, Shortness of Breath and Swelling of Extremities. Gastrointestinal  Not Present- Abdominal Pain,  Bloating, Bloody Stool, Change in Bowel Habits, Chronic diarrhea, Constipation, Difficulty Swallowing, Excessive gas, Gets full quickly at meals, Hemorrhoids, Indigestion, Nausea, Rectal Pain and Vomiting. Female Genitourinary Not Present- Frequency, Nocturia, Painful Urination, Pelvic Pain and Urgency. Musculoskeletal Not Present- Back Pain, Joint Pain, Joint Stiffness, Muscle Pain, Muscle Weakness and Swelling of Extremities. Neurological Not Present- Decreased Memory, Fainting, Headaches, Numbness, Seizures, Tingling, Tremor, Trouble walking and Weakness. Psychiatric Present- Anxiety and Depression. Not Present- Bipolar, Change in Sleep Pattern, Fearful and Frequent crying. Endocrine Present- Hair Changes and Heat Intolerance. Not Present- Cold Intolerance, Excessive Hunger, Hot flashes and New Diabetes.  Vitals  Weight: 199.6 lb Height: 62in Body Surface Area: 1.91 m Body Mass Index: 36.51 kg/m  Temp.: 97.70F  Pulse: 91 (Regular)  BP: 132/84(Sitting, Left Arm, Standard)       Physical Exam General Mental Status-Alert. General Appearance-Consistent with stated age. Hydration-Well hydrated. Voice-Normal.  Head and Neck Head-normocephalic, atraumatic with no lesions or palpable masses. Trachea-midline. Thyroid Gland Characteristics - normal size and consistency.  Eye Eyeball - Bilateral-Extraocular movements intact. Sclera/Conjunctiva - Bilateral-No scleral icterus.  Chest and Lung Exam Chest and lung exam reveals -quiet, even and easy respiratory effort with no use of accessory muscles and on auscultation, normal breath sounds, no adventitious sounds and normal vocal resonance. Inspection Chest Wall - Normal. Back - normal.  Breast Note: There is no palpable mass in either breast. There is no palpable axillary, supraclavicular, or cervical lymphadenopathy.   Cardiovascular Cardiovascular examination reveals -normal heart sounds,  regular rate and rhythm with no murmurs and normal pedal pulses bilaterally.  Abdomen Inspection Inspection of the abdomen reveals - No Hernias. Skin - Scar - no surgical scars. Palpation/Percussion Palpation and Percussion of the abdomen reveal - Soft, Non Tender, No Rebound tenderness, No Rigidity (guarding) and No hepatosplenomegaly. Auscultation Auscultation of the abdomen reveals - Bowel sounds normal.  Neurologic Neurologic evaluation reveals -alert and oriented x 3 with no impairment of recent or remote memory. Mental Status-Normal.  Musculoskeletal Normal Exam - Left-Upper Extremity Strength Normal and Lower Extremity Strength Normal. Normal Exam - Right-Upper Extremity Strength Normal and Lower Extremity Strength Normal.  Lymphatic Head & Neck  General Head & Neck Lymphatics: Bilateral - Description - Normal. Axillary  General Axillary Region: Bilateral - Description - Normal. Tenderness - Non Tender. Femoral & Inguinal  Generalized Femoral & Inguinal Lymphatics: Bilateral - Description - Normal. Tenderness - Non Tender.    Assessment & Plan  MALIGNANT NEOPLASM OF UPPER-OUTER QUADRANT OF RIGHT BREAST IN FEMALE, ESTROGEN RECEPTOR POSITIVE (C50.411) Impression: The patient appears to have a small stage I cancer in the upper outer quadrant of the right breast. I have discussed with her in detail the different options for treatment and at this point she favors breast conservation which I feel is a very reasonable way of treating her cancer. She would also be a good candidate for sentinel node biopsy at the same time. I have discussed with her in detail the risks and benefits of the operation as well as some of the technical aspects including the use of a radioactive seed for localization and she understands and wishes to proceed. I will go ahead and refer her to medical and radiation oncology to discuss adjuvant therapy. Given her family history of also refer her for  genetic counseling. I will also send her for preoperative lymphedema testing. This patient encounter took 60 minutes today to perform the following: take history, perform exam, review outside  records, interpret imaging, counsel the patient on their diagnosis and document encounter, findings & plan in the EHR Current Plans Referred to Oncology, for evaluation and follow up (Oncology). Routine. Referred to Genetic Counseling, for evaluation and follow up [employer]). Routine. Referred to Physical Therapy, for evaluation and follow up (Physical Therapy). Routine.

## 2020-04-20 NOTE — Op Note (Signed)
04/20/2020  4:20 PM  PATIENT:  Nicole Khan  55 y.o. female  PRE-OPERATIVE DIAGNOSIS:  RIGHT BREAST CANCER  POST-OPERATIVE DIAGNOSIS:  RIGHT BREAST CANCER  PROCEDURE:  Procedure(s): RIGHT BREAST LUMPECTOMY WITH RADIOACTIVE SEED LOCALIZATION AND DEEP RIGHT AXILLARY SENTINEL LYMPH NODE BIOPSY (Right)  SURGEON:  Surgeon(s) and Role:    * Jovita Kussmaul, MD - Primary  PHYSICIAN ASSISTANT:   ASSISTANTS: none   ANESTHESIA:   local and general  EBL:  minimal   BLOOD ADMINISTERED:none  DRAINS: none   LOCAL MEDICATIONS USED:  MARCAINE     SPECIMEN:  Source of Specimen:  right breast tissue with additional superior and lateral margin and sentinel nodes x 2  DISPOSITION OF SPECIMEN:  PATHOLOGY  COUNTS:  YES  TOURNIQUET:  * No tourniquets in log *  DICTATION: .Dragon Dictation   After informed consent was obtained the patient was brought to the operating room and placed in the supine position on the operating table.  After adequate induction of general anesthesia the patient's right chest, breast, and axillary area were prepped with ChloraPrep, allowed to dry, and draped in usual sterile manner.  An appropriate timeout was performed.  Previously an I-125 seed was placed in the lateral aspect of the right breast to mark an area of invasive breast cancer.  Earlier in the day the patient also underwent injection 1 mCi of technetium sulfur colloid in the subareolar position on the right.  The neoprobe was set to technetium and an area of radioactivity was readily identified in the right axilla.  This area was infiltrated with quarter percent Marcaine.  A small transversely oriented incision was made with a 15 blade knife overlying the area of radioactivity.  The incision was carried through the skin and subcutaneous tissue sharply with the electrocautery until the deep right axillary space was entered.  The neoprobe was used to direct blunt hemostat dissection.  I was able to identify  2 hot lymph nodes.  These were excised sharply with the electrocautery and the surrounding small vessels and lymphatics were controlled with clips.  Ex vivo counts on these 2 nodes ranged from 500 to 3000.  No other hot or palpable nodes were identified in the right axilla.  Hemostasis was achieved using the Bovie electrocautery.  The deep layer of the wound was closed with interrupted 3-0 Vicryl stitches.  The skin was closed with a running 4-0 Monocryl subcuticular stitch.  Attention was then turned to the right breast.  The neoprobe was set to I-125 in the area of radioactivity was readily identified.  The area around this was infiltrated with quarter percent Marcaine.  A curvilinear incision was made along the outer aspect of the areola of the right breast with a 15 blade knife.  The incision was carried through the skin and subcutaneous tissue sharply with the electrocautery.  Dissection was then carried sharply with the electrocautery towards the radioactive seed under the direction of the neoprobe.  Once I more closely approached the radioactive seed I then removed a circular portion of breast tissue sharply with the electrocautery around the radioactive seed while checking the area of radioactivity frequently.  Once the specimen was removed it was oriented with the appropriate paint colors.  A specimen radiograph was obtained that showed the clip and seed to be near the center of the specimen.  I did elect to take an additional superior and lateral margin and these were marked appropriately and also sent to pathology.  Hemostasis  was achieved using the Bovie electrocautery.  The wound was irrigated with saline and infiltrated with more quarter percent Marcaine.  The cavity was marked with clips.  The deep layer of the wound was then closed with layers of interrupted 3-0 Vicryl stitches.  The skin was then closed with interrupted 4-0 Monocryl subcuticular stitches.  Dermabond dressings were applied.  The  patient tolerated the procedure well.  At the end of the case all needle sponge and instrument counts were correct.  The patient was then awakened and taken recovery in stable condition.  PLAN OF CARE: Discharge to home after PACU  PATIENT DISPOSITION:  PACU - hemodynamically stable.   Delay start of Pharmacological VTE agent (>24hrs) due to surgical blood loss or risk of bleeding: not applicable

## 2020-04-20 NOTE — Anesthesia Preprocedure Evaluation (Addendum)
Anesthesia Evaluation  Patient identified by MRN, date of birth, ID band Patient awake    Reviewed: Allergy & Precautions, NPO status , Patient's Chart, lab work & pertinent test results  History of Anesthesia Complications (+) PONV and history of anesthetic complications  Airway Mallampati: IV  TM Distance: >3 FB Neck ROM: Full    Dental no notable dental hx.    Pulmonary asthma (exercise-induced) , sleep apnea and Continuous Positive Airway Pressure Ventilation ,    Pulmonary exam normal breath sounds clear to auscultation       Cardiovascular hypertension, Pt. on home beta blockers Normal cardiovascular exam Rhythm:Regular Rate:Normal  ECG: SR, rate 76   Neuro/Psych PSYCHIATRIC DISORDERS Anxiety Depression negative neurological ROS     GI/Hepatic negative GI ROS, Neg liver ROS,   Endo/Other  diabetes, Oral Hypoglycemic AgentsPolycystic ovary disease  Renal/GU Renal disease     Musculoskeletal negative musculoskeletal ROS (+)   Abdominal (+) + obese,   Peds  Hematology HLD   Anesthesia Other Findings RIGHT BREAST CANCER  Reproductive/Obstetrics                            Anesthesia Physical Anesthesia Plan  ASA: III  Anesthesia Plan: General and Regional   Post-op Pain Management:    Induction: Intravenous  PONV Risk Score and Plan: 4 or greater and Ondansetron, Dexamethasone, Midazolam and Treatment may vary due to age or medical condition  Airway Management Planned: LMA  Additional Equipment:   Intra-op Plan:   Post-operative Plan: Extubation in OR  Informed Consent: I have reviewed the patients History and Physical, chart, labs and discussed the procedure including the risks, benefits and alternatives for the proposed anesthesia with the patient or authorized representative who has indicated his/her understanding and acceptance.     Dental advisory given  Plan  Discussed with: CRNA and Surgeon  Anesthesia Plan Comments:        Anesthesia Quick Evaluation

## 2020-04-20 NOTE — Anesthesia Procedure Notes (Signed)
Anesthesia Regional Block: Pectoralis block   Pre-Anesthetic Checklist: ,, timeout performed, Correct Patient, Correct Site, Correct Laterality, Correct Procedure, Correct Position, site marked, Risks and benefits discussed,  Surgical consent,  Pre-op evaluation,  At surgeon's request and post-op pain management  Laterality: Right  Prep: chloraprep       Needles:  Injection technique: Single-shot  Needle Type: Echogenic Stimulator Needle     Needle Length: 10cm  Needle Gauge: 20     Additional Needles:   Procedures:,,,, ultrasound used (permanent image in chart),,,,  Narrative:  Start time: 04/20/2020 2:40 PM End time: 04/20/2020 2:50 PM Injection made incrementally with aspirations every 5 mL.  Performed by: Personally  Anesthesiologist: Murvin Natal, MD  Additional Notes: Functioning IV was confirmed and monitors were applied.  A timeout was performed. Sterile prep, hand hygiene and sterile gloves were used. A 134mm 20ga BBraun echogenic stimulator needle was used. Negative aspiration and negative test dose prior to incremental administration of local anesthetic. The patient tolerated the procedure well.  Ultrasound guidance: relevent anatomy identified, needle position confirmed, local anesthetic spread visualized around nerve(s), vascular puncture avoided.  Image printed for medical record.

## 2020-04-20 NOTE — Anesthesia Procedure Notes (Signed)
Procedure Name: LMA Insertion Date/Time: 04/20/2020 3:07 PM Performed by: Barrington Ellison, CRNA Pre-anesthesia Checklist: Patient identified, Emergency Drugs available, Suction available and Patient being monitored Patient Re-evaluated:Patient Re-evaluated prior to induction Oxygen Delivery Method: Circle System Utilized Preoxygenation: Pre-oxygenation with 100% oxygen Induction Type: IV induction Ventilation: Mask ventilation without difficulty LMA: LMA inserted LMA Size: 4.0 Number of attempts: 1 Placement Confirmation: positive ETCO2 Tube secured with: Tape Dental Injury: Teeth and Oropharynx as per pre-operative assessment

## 2020-04-20 NOTE — Interval H&P Note (Signed)
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  The various methods of treatment have been discussed with the patient and family. After consideration of risks, benefits and other options for treatment, the patient has consented to  Procedure(s): RIGHT BREAST LUMPECTOMY WITH RADIOACTIVE SEED AND SENTINEL LYMPH NODE BIOPSY (Right) as a surgical intervention.  The patient's history has been reviewed, patient examined, no change in status, stable for surgery.  I have reviewed the patient's chart and labs.  Questions were answered to the patient's satisfaction.     Nicole Khan

## 2020-04-20 NOTE — Transfer of Care (Signed)
Immediate Anesthesia Transfer of Care Note  Patient: Nicole Khan  Procedure(s) Performed: RIGHT BREAST LUMPECTOMY WITH RADIOACTIVE SEED AND SENTINEL LYMPH NODE BIOPSY (Right Breast)  Patient Location: PACU  Anesthesia Type:General  Level of Consciousness: drowsy and patient cooperative  Airway & Oxygen Therapy: Patient Spontanous Breathing and Patient connected to face mask oxygen  Post-op Assessment: Report given to RN  Post vital signs: Reviewed and stable  Last Vitals:  Vitals Value Taken Time  BP 100/58 04/20/20 1637  Temp    Pulse 81 04/20/20 1639  Resp 15 04/20/20 1639  SpO2 95 % 04/20/20 1639  Vitals shown include unvalidated device data.  Last Pain:  Vitals:   04/20/20 1340  TempSrc:   PainSc: 0-No pain      Patients Stated Pain Goal: 2 ([id number])  Complications: No complications documented.

## 2020-04-21 ENCOUNTER — Encounter (HOSPITAL_COMMUNITY): Payer: Self-pay | Admitting: General Surgery

## 2020-04-25 ENCOUNTER — Telehealth: Payer: Self-pay | Admitting: *Deleted

## 2020-04-25 NOTE — Telephone Encounter (Signed)
Revealed negative genetic testing.  Discussed that we do not know why she has breast cancer or why there is cancer in the family. It could be sporadic/familial, due to a different gene that we are not testing, or maybe our current technology may not be able to pick something up.  It will be important for her to keep in contact with genetics to keep up with whether additional testing may be needed.   

## 2020-04-25 NOTE — Telephone Encounter (Signed)
Called pt to r/s post op with Dr. Lindi Adie to 1/27 at 4pm. Notified pt pathology report has not yet been signed out. Pt appreciative to r/s to be able to discuss final pathology report. No further needs voiced

## 2020-04-25 NOTE — Progress Notes (Incomplete)
Patient Care Team: Sigmund Hazel, MD as PCP - General (Family Medicine) Donnelly Angelica, RN as Nurse Navigator Lu Duffel, Margretta Ditty, RN as Oncology Nurse Navigator  DIAGNOSIS: No diagnosis found.  SUMMARY OF ONCOLOGIC HISTORY: Oncology History  Malignant neoplasm of upper-outer quadrant of right breast in female, estrogen receptor positive (HCC)  02/29/2020 Initial Diagnosis   Screening mammogram detected right breast mass. 0.6cm mass at the 10 o'clock position with multiple benign cysts. Biopsy on 02/29/20 showed invasive ductal carcinoma with DCIS, grade 1, HER-2 negative (1+), ER/PR >95%, Ki67: 5%.    03/21/2020 Cancer Staging   Staging form: Breast, AJCC 8th Edition - Clinical stage from 03/21/2020: Stage IA (cT1b, cN0, cM0, G1, ER+, PR+, HER2-) - Signed by Serena Croissant, MD on 03/21/2020   04/16/2020 Genetic Testing   Negative genetic testing: no pathogenic variants detected in [facility] Multi-Cancer Panel.  The report date is April 16, 2020.    The Multi-Cancer Panel offered by [facility] includes sequencing and/or deletion duplication testing of the following 85 genes: AIP, ALK, APC, ATM, AXIN2,BAP1,  BARD1, BLM, BMPR1A, BRCA1, BRCA2, BRIP1, CASR, CDC73, CDH1, CDK4, CDKN1B, CDKN1C, CDKN2A (p14ARF), CDKN2A (p16INK4a), CEBPA, CHEK2, CTNNA1, DICER1, DIS3L2, EGFR (c.2369C>T, p.Thr790Met variant only), EPCAM (Deletion/duplication testing only), FH, FLCN, GATA2, GPC3, GREM1 (Promoter region deletion/duplication testing only), HOXB13 (c.251G>A, p.Gly84Glu), HRAS, KIT, MAX, MEN1, MET, MITF (c.952G>A, p.Glu318Lys variant only), MLH1, MSH2, MSH3, MSH6, MUTYH, NBN, NF1, NF2, NTHL1, PALB2, PDGFRA, PHOX2B, PMS2, POLD1, POLE, POT1, PRKAR1A, PTCH1, PTEN, RAD50, RAD51C, RAD51D, RB1, RECQL4, RET, RNF43, RUNX1, SDHAF2, SDHA (sequence changes only), SDHB, SDHC, SDHD, SMAD4, SMARCA4, SMARCB1, SMARCE1, STK11, SUFU, TERC, TERT, TMEM127, TP53, TSC1, TSC2, VHL, WRN and WT1.      CHIEF COMPLIANT: Follow-up s/p  right lumpectomy   INTERVAL HISTORY: Nicole Khan is a 55 y.o. with above-mentioned history of right breast cancer. She underwent a right lumpectomy on 04/20/20 with Dr. Carolynne Edouard for which pathology showed ***. She presents to the clinic today to review the pathology report and discuss further treatment.   ALLERGIES:  is allergic to soy allergy, celery oil, eggs or egg-derived products, hazel tree pollen [corylus], and tape.  MEDICATIONS:  Current Outpatient Medications  Medication Sig Dispense Refill  . acetaminophen (TYLENOL) 500 MG tablet Take 1,000 mg by mouth every 6 (six) hours as needed for headache.    . albuterol (PROVENTIL HFA;VENTOLIN HFA) 108 (90 Base) MCG/ACT inhaler Inhale 1 puff into the lungs every 6 (six) hours as needed for wheezing or shortness of breath.    Marland Kitchen atorvastatin (LIPITOR) 40 MG tablet Take 40 mg by mouth at bedtime.   1  . azelastine (ASTELIN) 0.1 % nasal spray Place 1 spray into both nostrils 2 (two) times daily as needed for rhinitis or allergies. Use in each nostril as directed    . buPROPion (WELLBUTRIN XL) 300 MG 24 hr tablet Take 300 mg by mouth daily.    . Cholecalciferol (VITAMIN D3) 1.25 MG (50000 UT) TABS Take 50,000 Units by mouth every Friday.    . citalopram (CELEXA) 20 MG tablet Take 20 mg by mouth daily.    Marland Kitchen EPINEPHrine (EPIPEN 2-PAK) 0.3 mg/0.3 mL IJ SOAJ injection Inject 0.3 mg into the muscle as needed for anaphylaxis.    Marland Kitchen HYDROcodone-acetaminophen (NORCO/VICODIN) 5-325 MG tablet Take 1-2 tablets by mouth every 6 (six) hours as needed for moderate pain or severe pain. 15 tablet 0  . JARDIANCE 10 MG TABS tablet Take 10 mg by mouth daily.    Marland Kitchen  levocetirizine (XYZAL) 5 MG tablet Take 5 mg by mouth daily.     . metFORMIN (GLUCOPHAGE-XR) 500 MG 24 hr tablet Take 2,000 mg by mouth daily.    . metoprolol (LOPRESSOR) 100 MG tablet Take 100 mg by mouth 2 (two) times daily.    . Multiple Vitamins-Minerals (HAIR VITAMINS PO) Take 1 tablet by mouth  daily. Maxi Hair    . phenylephrine (SUDAFED PE) 10 MG TABS tablet Take 10 mg by mouth every 6 (six) hours as needed (for congestion).    Marland Kitchen spironolactone (ALDACTONE) 100 MG tablet Take 1 tablet (100 mg total) by mouth 2 (two) times daily. 180 tablet 3  . TRULICITY 1.5 MG/5.0IB SOPN Inject 1.5 mg as directed every Wednesday.    Marland Kitchen UNABLE TO FIND Immunotherapy Allergy Shot. Injection to each arm once weekly on various day     No current facility-administered medications for this visit.    PHYSICAL EXAMINATION: ECOG PERFORMANCE STATUS: {CHL ONC ECOG PS:[phone]}  There were no vitals filed for this visit. There were no vitals filed for this visit.   LABORATORY DATA:  I have reviewed the data as listed CMP Latest Ref Rng & Units 04/17/2020 09/24/2017 06/02/2014  Glucose 70 - 99 mg/dL 140(H) 165(H) 155(H)  BUN 6 - 20 mg/dL $Remove'14 11 15  'lVfHNLz$ Creatinine 0.44 - 1.00 mg/dL 1.00 0.93 0.87  Sodium 135 - 145 mmol/L 138 139 135  Potassium 3.5 - 5.1 mmol/L 4.4 4.3 4.5  Chloride 98 - 111 mmol/L 104 107 105  CO2 22 - 32 mmol/L $RemoveB'24 24 19  'bJCgJCtS$ Calcium 8.9 - 10.3 mg/dL 9.4 9.5 9.3  Total Protein 6.5 - 8.1 g/dL - 6.5 -  Total Bilirubin 0.3 - 1.2 mg/dL - 1.2 -  Alkaline Phos 38 - 126 U/L - 71 -  AST 15 - 41 U/L - 38 -  ALT 0 - 44 U/L - 60(H) -    Lab Results  Component Value Date   WBC 7.8 04/17/2020   HGB 16.7 (H) 04/17/2020   HCT 52.0 (H) 04/17/2020   MCV 96.7 04/17/2020   PLT 307 04/17/2020   NEUTROABS 5.4 09/24/2017    ASSESSMENT & PLAN:  No problem-specific Assessment & Plan notes found for this encounter.    No orders of the defined types were placed in this encounter.  The patient has a good understanding of the overall plan. she agrees with it. she will call with any problems that may develop before the next visit here.  Total time spent: *** mins including face to face time and time spent for planning, charting and coordination of care  Nicholas Lose, MD 04/25/2020  I, Cloyde Reams Dorshimer,  am acting as scribe for Dr. Nicholas Lose.  {insert scribe attestation}

## 2020-04-26 ENCOUNTER — Inpatient Hospital Stay: Payer: 59 | Admitting: Hematology and Oncology

## 2020-04-26 LAB — SURGICAL PATHOLOGY

## 2020-04-26 NOTE — Progress Notes (Signed)
Patient Care Team: [name], MD as PCP - General (Family Medicine) Rockwell Germany, RN as Nurse Navigator Tressie Ellis, Paulette Blanch, RN as Oncology Nurse Navigator  DIAGNOSIS:    ICD-10-CM   1. Malignant neoplasm of upper-outer quadrant of right breast in female, estrogen receptor positive ([place])  C50.411    Z17.0     SUMMARY OF ONCOLOGIC HISTORY: Oncology History  Malignant neoplasm of upper-outer quadrant of right breast in female, estrogen receptor positive ([place])  02/29/2020 Initial Diagnosis   Screening mammogram detected right breast mass. 0.6cm mass at the 10 o'clock position with multiple benign cysts. Biopsy on 02/29/20 showed invasive ductal carcinoma with DCIS, grade 1, HER-2 negative (1+), ER/PR >95%, Ki67: 5%.    03/21/2020 Cancer Staging   Staging form: Breast, AJCC 8th Edition - Clinical stage from 03/21/2020: Stage IA (cT1b, cN0, cM0, G1, ER+, PR+, HER2-) - Signed by Nicholas Lose, MD on 03/21/2020   04/16/2020 Genetic Testing   Negative genetic testing: no pathogenic variants detected in [facility] Multi-Cancer Panel.  The report date is April 16, 2020.    The Multi-Cancer Panel offered by [facility] includes sequencing and/or deletion duplication testing of the following 85 genes: AIP, ALK, APC, ATM, AXIN2,BAP1,  BARD1, BLM, BMPR1A, BRCA1, BRCA2, BRIP1, CASR, CDC73, CDH1, CDK4, CDKN1B, CDKN1C, CDKN2A (p14ARF), CDKN2A (p16INK4a), CEBPA, CHEK2, CTNNA1, DICER1, DIS3L2, EGFR (c.2369C>T, p.Thr790Met variant only), EPCAM (Deletion/duplication testing only), FH, FLCN, GATA2, GPC3, GREM1 (Promoter region deletion/duplication testing only), HOXB13 (c.251G>A, p.Gly84Glu), HRAS, KIT, MAX, MEN1, MET, MITF (c.952G>A, p.Glu318Lys variant only), MLH1, MSH2, MSH3, MSH6, MUTYH, NBN, NF1, NF2, NTHL1, PALB2, PDGFRA, PHOX2B, PMS2, POLD1, POLE, POT1, PRKAR1A, PTCH1, PTEN, RAD50, RAD51C, RAD51D, RB1, RECQL4, RET, RNF43, RUNX1, SDHAF2, SDHA (sequence changes only), SDHB, SDHC, SDHD, SMAD4, SMARCA4,  SMARCB1, SMARCE1, STK11, SUFU, TERC, TERT, TMEM127, TP53, TSC1, TSC2, VHL, WRN and WT1.    04/20/2020 Surgery   Right lumpectomy Nicole Khan): invasive and in situ carcinoma, grade 1, 0.9cm, DCIS focally present at margin, 2 right axillary lymph nodes negative for carcinoma.     CHIEF COMPLIANT: Follow-up s/p lumpectomy   INTERVAL HISTORY: Nicole Khan is a 55 y.o. with above-mentioned history of right breast cancer. She underwent a right lumpectomy on 04/20/20 for which pathology showed invasive and in situ carcinoma, grade 1, 0.9cm, DCIS focally present at margin, 2 right axillary lymph nodes negative for carcinoma. She presents to the clinic today for follow-up.   ALLERGIES:  is allergic to soy allergy, celery oil, eggs or egg-derived products, hazel tree pollen [corylus], and tape.  MEDICATIONS:  Current Outpatient Medications  Medication Sig Dispense Refill   acetaminophen (TYLENOL) 500 MG tablet Take 1,000 mg by mouth every 6 (six) hours as needed for headache.     albuterol (PROVENTIL HFA;VENTOLIN HFA) 108 (90 Base) MCG/ACT inhaler Inhale 1 puff into the lungs every 6 (six) hours as needed for wheezing or shortness of breath.     atorvastatin (LIPITOR) 40 MG tablet Take 40 mg by mouth at bedtime.   1   azelastine (ASTELIN) 0.1 % nasal spray Place 1 spray into both nostrils 2 (two) times daily as needed for rhinitis or allergies. Use in each nostril as directed     buPROPion (WELLBUTRIN XL) 300 MG 24 hr tablet Take 300 mg by mouth daily.     Cholecalciferol (VITAMIN D3) 1.25 MG (50000 UT) TABS Take 50,000 Units by mouth every Friday.     citalopram (CELEXA) 20 MG tablet Take 20 mg by mouth daily.  EPINEPHrine (EPIPEN 2-PAK) 0.3 mg/0.3 mL IJ SOAJ injection Inject 0.3 mg into the muscle as needed for anaphylaxis.     HYDROcodone-acetaminophen (NORCO/VICODIN) 5-325 MG tablet Take 1-2 tablets by mouth every 6 (six) hours as needed for moderate pain or severe pain. 15 tablet 0    JARDIANCE 10 MG TABS tablet Take 10 mg by mouth daily.     levocetirizine (XYZAL) 5 MG tablet Take 5 mg by mouth daily.      metFORMIN (GLUCOPHAGE-XR) 500 MG 24 hr tablet Take 2,000 mg by mouth daily.     metoprolol (LOPRESSOR) 100 MG tablet Take 100 mg by mouth 2 (two) times daily.     Multiple Vitamins-Minerals (HAIR VITAMINS PO) Take 1 tablet by mouth daily. Maxi Hair     phenylephrine (SUDAFED PE) 10 MG TABS tablet Take 10 mg by mouth every 6 (six) hours as needed (for congestion).     spironolactone (ALDACTONE) 100 MG tablet Take 1 tablet (100 mg total) by mouth 2 (two) times daily. 768 tablet 3   TRULICITY 1.5 TL/5.7WI SOPN Inject 1.5 mg as directed every Wednesday.     UNABLE TO FIND Immunotherapy Allergy Shot. Injection to each arm once weekly on various day     No current facility-administered medications for this visit.    PHYSICAL EXAMINATION: ECOG PERFORMANCE STATUS: 1 - Symptomatic but completely ambulatory  Vitals:   04/27/20 1610  BP: 110/64  Pulse: 81  Resp: 18  Temp: (!) 97.5 F (36.4 C)  SpO2: 95%   Filed Weights   04/27/20 1610  Weight: 194 lb 12.8 oz (88.4 kg)    LABORATORY DATA:  I have reviewed the data as listed CMP Latest Ref Rng & Units 04/17/2020 09/24/2017 06/02/2014  Glucose 70 - 99 mg/dL 140(H) 165(H) 155(H)  BUN 6 - 20 mg/dL $Remove'14 11 15  'UnAeGbg$ Creatinine 0.44 - 1.00 mg/dL 1.00 0.93 0.87  Sodium 135 - 145 mmol/L 138 139 135  Potassium 3.5 - 5.1 mmol/L 4.4 4.3 4.5  Chloride 98 - 111 mmol/L 104 107 105  CO2 22 - 32 mmol/L $RemoveB'24 24 19  'kBPUJmuY$ Calcium 8.9 - 10.3 mg/dL 9.4 9.5 9.3  Total Protein 6.5 - 8.1 g/dL - 6.5 -  Total Bilirubin 0.3 - 1.2 mg/dL - 1.2 -  Alkaline Phos 38 - 126 U/L - 71 -  AST 15 - 41 U/L - 38 -  ALT 0 - 44 U/L - 60(H) -    Lab Results  Component Value Date   WBC 7.8 04/17/2020   HGB 16.7 (H) 04/17/2020   HCT 52.0 (H) 04/17/2020   MCV 96.7 04/17/2020   PLT 307 04/17/2020   NEUTROABS 5.4 09/24/2017    ASSESSMENT & PLAN:   Malignant neoplasm of upper-outer quadrant of right breast in female, estrogen receptor positive ([place]) 02/29/2020: Screening detected right breast cancer 0.6 cm at 10 o'clock position with multiple benign cysts.  Biopsy 02/29/2020: Grade 1 IDC with DCIS ER/PR greater than 95%, Ki-67: 5%, HER-2 negative T1BN0 stage Ia 04/20/2020: Right lumpectomy: Grade 1 IDC 0.9 cm, intermediate grade DCIS, margins negative, DCIS focally positive posterior margin and less than 1 mm from anterior margin.  0/2 lymph nodes negative,  Pathology counseling: I discussed the final pathology report of the patient provided  a copy of this report. I discussed the margins as well as lymph node surgeries. We also discussed the final staging along with previously performed ER/PR and HER-2/neu testing.  Treatment plan: 1.  Adjuvant radiation therapy 2. followed  by adjuvant antiestrogen therapy with anastrozole 1 mg daily x5 years return to clinic after radiation to start antiestrogen therapy 3.  There is a focal margin positive for DCIS: We will discuss this in the tumor board and call her back with our recommendation.  Return to clinic after radiation is complete.   No orders of the defined types were placed in this encounter.  The patient has a good understanding of the overall plan. she agrees with it. she will call with any problems that may develop before the next visit here.  Total time spent: 20 mins including face to face time and time spent for planning, charting and coordination of care  Nicholas Lose, MD 04/27/2020  I, Cloyde Reams Dorshimer, am acting as scribe for Dr. Nicholas Lose.  I have reviewed the above documentation for accuracy and completeness, and I agree with the above.

## 2020-04-27 ENCOUNTER — Other Ambulatory Visit: Payer: Self-pay

## 2020-04-27 ENCOUNTER — Inpatient Hospital Stay (HOSPITAL_BASED_OUTPATIENT_CLINIC_OR_DEPARTMENT_OTHER): Payer: 59 | Admitting: Hematology and Oncology

## 2020-04-27 ENCOUNTER — Encounter: Payer: Self-pay | Admitting: *Deleted

## 2020-04-27 DIAGNOSIS — Z170 Estrogen receptor positive status [ER+]: Secondary | ICD-10-CM | POA: Diagnosis not present

## 2020-04-27 DIAGNOSIS — C50411 Malignant neoplasm of upper-outer quadrant of right female breast: Secondary | ICD-10-CM | POA: Diagnosis not present

## 2020-04-27 NOTE — Assessment & Plan Note (Signed)
02/29/2020: Screening detected right breast cancer 0.6 cm at 10 o'clock position with multiple benign cysts.  Biopsy 02/29/2020: Grade 1 IDC with DCIS ER/PR greater than 95%, Ki-67: 5%, HER-2 negative T1BN0 stage Ia 04/20/2020: Right lumpectomy: Grade 1 IDC 0.9 cm, intermediate grade DCIS, margins negative, DCIS focally positive posterior margin and less than 1 mm from anterior margin.  0/2 lymph nodes negative,  Pathology counseling: I discussed the final pathology report of the patient provided  a copy of this report. I discussed the margins as well as lymph node surgeries. We also discussed the final staging along with previously performed ER/PR and HER-2/neu testing.  Treatment plan: 1.  Adjuvant radiation therapy 2. followed by adjuvant antiestrogen therapy with anastrozole 1 mg daily x5 years return to clinic after radiation to start antiestrogen therapy

## 2020-04-28 ENCOUNTER — Encounter: Payer: Self-pay | Admitting: Genetic Counselor

## 2020-04-28 ENCOUNTER — Ambulatory Visit: Payer: Self-pay | Admitting: Genetic Counselor

## 2020-04-28 DIAGNOSIS — Z803 Family history of malignant neoplasm of breast: Secondary | ICD-10-CM

## 2020-04-28 DIAGNOSIS — Z808 Family history of malignant neoplasm of other organs or systems: Secondary | ICD-10-CM

## 2020-04-28 DIAGNOSIS — Z8042 Family history of malignant neoplasm of prostate: Secondary | ICD-10-CM

## 2020-04-28 DIAGNOSIS — C50411 Malignant neoplasm of upper-outer quadrant of right female breast: Secondary | ICD-10-CM

## 2020-04-28 DIAGNOSIS — Z170 Estrogen receptor positive status [ER+]: Secondary | ICD-10-CM

## 2020-04-28 NOTE — Progress Notes (Signed)
HPI:  Nicole Khan was previously seen in the [facility] clinic due to a personal and family history of cancer and concerns regarding a hereditary predisposition to cancer. Please refer to our prior cancer genetics clinic note for more information regarding our discussion, assessment and recommendations, at the time. Nicole Khan recent genetic test results were disclosed to her, as were recommendations warranted by these results. These results and recommendations are discussed in more detail below.  CANCER HISTORY:  Oncology History  Malignant neoplasm of upper-outer quadrant of right breast in female, estrogen receptor positive ([place])  02/29/2020 Initial Diagnosis   Screening mammogram detected right breast mass. 0.6cm mass at the 10 o'clock position with multiple benign cysts. Biopsy on 02/29/20 showed invasive ductal carcinoma with DCIS, grade 1, HER-2 negative (1+), ER/PR >95%, Ki67: 5%.    03/21/2020 Cancer Staging   Staging form: Breast, AJCC 8th Edition - Clinical stage from 03/21/2020: Stage IA (cT1b, cN0, cM0, G1, ER+, PR+, HER2-) - Signed by Nicholas Lose, MD on 03/21/2020   04/16/2020 Genetic Testing   Negative genetic testing: no pathogenic variants detected in [facility] Multi-Cancer Panel.  The report date is April 16, 2020.    The Multi-Cancer Panel offered by [facility] includes sequencing and/or deletion duplication testing of the following 85 genes: AIP, ALK, APC, ATM, AXIN2,BAP1,  BARD1, BLM, BMPR1A, BRCA1, BRCA2, BRIP1, CASR, CDC73, CDH1, CDK4, CDKN1B, CDKN1C, CDKN2A (p14ARF), CDKN2A (p16INK4a), CEBPA, CHEK2, CTNNA1, DICER1, DIS3L2, EGFR (c.2369C>T, p.Thr790Met variant only), EPCAM (Deletion/duplication testing only), FH, FLCN, GATA2, GPC3, GREM1 (Promoter region deletion/duplication testing only), HOXB13 (c.251G>A, p.Gly84Glu), HRAS, KIT, MAX, MEN1, MET, MITF (c.952G>A, p.Glu318Lys variant only), MLH1, MSH2, MSH3, MSH6, MUTYH, NBN, NF1, NF2, NTHL1, PALB2, PDGFRA,  PHOX2B, PMS2, POLD1, POLE, POT1, PRKAR1A, PTCH1, PTEN, RAD50, RAD51C, RAD51D, RB1, RECQL4, RET, RNF43, RUNX1, SDHAF2, SDHA (sequence changes only), SDHB, SDHC, SDHD, SMAD4, SMARCA4, SMARCB1, SMARCE1, STK11, SUFU, TERC, TERT, TMEM127, TP53, TSC1, TSC2, VHL, WRN and WT1.    04/20/2020 Surgery   Right lumpectomy [name]): invasive and in situ carcinoma, grade 1, 0.9cm, DCIS focally present at margin, 2 right axillary lymph nodes negative for carcinoma.     FAMILY HISTORY:  We obtained a detailed, 4-generation family history.  Significant diagnoses are listed below: Family History  Problem Relation Age of Onset  . Cancer Father        Gallbladder; dx 76s  . Breast cancer Sister 82  . Thyroid cancer Sister 55       unknown type  . Prostate cancer Paternal Uncle        dx 5s  . Lung cancer Half-Sister 79       maternal half sister; no smoking hx    Nicole Khan has two daughters, ages 59 and 59.  Nicole Khan has one full brother, one full sister, and one maternal half sister.  Her full sister was diagnosed with thyroid cancer at age 36 and breast cancer at age 45.  She reportedly had negative genetic testing (no report available for review today).  Nicole Khan maternal half sister was diagnosed with lung cancer at age 91.   Nicole Khan mother passed away at age 20 and did not have cancer.  No maternal family history of cancer was reported.  Nicole Khan reported that her maternal family had exposures related to the [place].   Nicole Khan father is 69 years old and has a history of gallbladder cancer diagnosed in his 13s.  Nicole Khan paternal uncle, age 4,  was diagnosed with prostate cancer in his 69s. No other paternal family history of cancer was reported.   Nicole Khan is unaware of previous family history of genetic testing for hereditary cancer risks besides that mentioned above. Patient's maternal ancestors are of [place] descent, and paternal ancestors are of  [place] descent. There is no reported Ashkenazi Jewish ancestry. There is no known consanguinity.  GENETIC TEST RESULTS: Genetic testing reported out on April 16, 2020.  The [facility] Multi-Cancer Panel found no pathogenic mutations. The Multi-Cancer Panel offered by [facility] includes sequencing and/or deletion duplication testing of the following 85 genes: AIP, ALK, APC, ATM, AXIN2,BAP1,  BARD1, BLM, BMPR1A, BRCA1, BRCA2, BRIP1, CASR, CDC73, CDH1, CDK4, CDKN1B, CDKN1C, CDKN2A (p14ARF), CDKN2A (p16INK4a), CEBPA, CHEK2, CTNNA1, DICER1, DIS3L2, EGFR (c.2369C>T, p.Thr790Met variant only), EPCAM (Deletion/duplication testing only), FH, FLCN, GATA2, GPC3, GREM1 (Promoter region deletion/duplication testing only), HOXB13 (c.251G>A, p.Gly84Glu), HRAS, KIT, MAX, MEN1, MET, MITF (c.952G>A, p.Glu318Lys variant only), MLH1, MSH2, MSH3, MSH6, MUTYH, NBN, NF1, NF2, NTHL1, PALB2, PDGFRA, PHOX2B, PMS2, POLD1, POLE, POT1, PRKAR1A, PTCH1, PTEN, RAD50, RAD51C, RAD51D, RB1, RECQL4, RET, RNF43, RUNX1, SDHAF2, SDHA (sequence changes only), SDHB, SDHC, SDHD, SMAD4, SMARCA4, SMARCB1, SMARCE1, STK11, SUFU, TERC, TERT, TMEM127, TP53, TSC1, TSC2, VHL, WRN and WT1.   The test report has been scanned into EPIC and is located under the Molecular Pathology section of the Results Review tab.  A portion of the result report is included below for reference.     We discussed with Nicole Khan that because current genetic testing is not perfect, it is possible there may be a gene mutation in one of these genes that current testing cannot detect, but that chance is small.  We also discussed, that there could be another gene that has not yet been discovered, or that we have not yet tested, that is responsible for the cancer diagnoses in the family. It is also possible there is a hereditary cause for the cancer in the family that Nicole Khan did not inherit and therefore was not identified in her testing.  Therefore, it is important to remain in  touch with cancer genetics in the future so that we can continue to offer Nicole Khan the most up to date genetic testing.     ADDITIONAL GENETIC TESTING: We discussed with Nicole Khan that her genetic testing was fairly extensive.  If there are genes identified to increase cancer risk that can be analyzed in the future, we would be happy to discuss and coordinate this testing at that time.    CANCER SCREENING RECOMMENDATIONS: Ms. Pretlow test result is considered negative (normal).  This means that we have not identified a hereditary cause for her personal and family history of cancer at this time. Most cancers happen by chance and this negative test suggests that her cancer may fall into this category.    While reassuring, this does not definitively rule out a hereditary predisposition to cancer. It is still possible that there could be genetic mutations that are undetectable by current technology. There could be genetic mutations in genes that have not been tested or identified to increase cancer risk.  Therefore, it is recommended she continue to follow the cancer management and screening guidelines provided by heroncology and primary healthcare provider.   An individual's cancer risk and medical management are not determined by genetic test results alone. Overall cancer risk assessment incorporates additional factors, including personal medical history, family history, and any available genetic information that may result in a personalized plan  for cancer prevention and surveillance  RECOMMENDATIONS FOR FAMILY MEMBERS:  Individuals in this family might be at some increased risk of developing cancer, over the general population risk, simply due to the family history of cancer.  We recommended women in this family have a yearly mammogram beginning at age 70, or 29 years younger than the earliest onset of cancer, an annual clinical breast exam, and perform monthly breast self-exams. Women in this  family should also have a gynecological exam as recommended by their primary provider. All family members should be referred for colonoscopy starting at age 34.   FOLLOW-UP: Lastly, we discussed with Ms. Petrakis that cancer genetics is a rapidly advancing field and it is possible that new genetic tests will be appropriate for her and/or her family members in the future. We encouraged her to remain in contact with cancer genetics on an annual basis so we can update her personal and family histories and let her know of advances in cancer genetics that may benefit this family.   Our contact number was provided. Ms. Biggs questions were answered to her satisfaction, and she knows she is welcome to call us at anytime with additional questions or concerns.    Kale Dols M. Joette Catching, [place], [facility] Genetic Counselor [email or web address] (P) [phone]

## 2020-05-03 ENCOUNTER — Other Ambulatory Visit (HOSPITAL_COMMUNITY): Payer: Self-pay | Admitting: Internal Medicine

## 2020-05-03 ENCOUNTER — Ambulatory Visit: Payer: Self-pay | Admitting: General Surgery

## 2020-05-03 ENCOUNTER — Encounter: Payer: Self-pay | Admitting: *Deleted

## 2020-05-03 DIAGNOSIS — Z79899 Other long term (current) drug therapy: Secondary | ICD-10-CM | POA: Diagnosis not present

## 2020-05-03 DIAGNOSIS — E282 Polycystic ovarian syndrome: Secondary | ICD-10-CM | POA: Diagnosis not present

## 2020-05-03 DIAGNOSIS — J3081 Allergic rhinitis due to animal (cat) (dog) hair and dander: Secondary | ICD-10-CM | POA: Diagnosis not present

## 2020-05-03 DIAGNOSIS — E1165 Type 2 diabetes mellitus with hyperglycemia: Secondary | ICD-10-CM | POA: Diagnosis not present

## 2020-05-03 DIAGNOSIS — J301 Allergic rhinitis due to pollen: Secondary | ICD-10-CM | POA: Diagnosis not present

## 2020-05-03 DIAGNOSIS — J3089 Other allergic rhinitis: Secondary | ICD-10-CM | POA: Diagnosis not present

## 2020-05-03 DIAGNOSIS — E673 Hypervitaminosis D: Secondary | ICD-10-CM | POA: Diagnosis not present

## 2020-05-03 DIAGNOSIS — Z87442 Personal history of urinary calculi: Secondary | ICD-10-CM | POA: Diagnosis not present

## 2020-05-03 DIAGNOSIS — Z7984 Long term (current) use of oral hypoglycemic drugs: Secondary | ICD-10-CM | POA: Diagnosis not present

## 2020-05-03 DIAGNOSIS — E559 Vitamin D deficiency, unspecified: Secondary | ICD-10-CM | POA: Diagnosis not present

## 2020-05-03 MED FILL — JARDIANCE 10 MG TABLET: 10 | 30 days supply | Qty: 30 | Fill #0

## 2020-05-03 MED FILL — TRULICITY 3 MG/0.5ML SOPN: 3 | 84 days supply | Qty: 6 | Fill #0

## 2020-05-05 ENCOUNTER — Encounter: Payer: Self-pay | Admitting: *Deleted

## 2020-05-11 DIAGNOSIS — J3081 Allergic rhinitis due to animal (cat) (dog) hair and dander: Secondary | ICD-10-CM | POA: Diagnosis not present

## 2020-05-11 DIAGNOSIS — J3089 Other allergic rhinitis: Secondary | ICD-10-CM | POA: Diagnosis not present

## 2020-05-11 DIAGNOSIS — J301 Allergic rhinitis due to pollen: Secondary | ICD-10-CM | POA: Diagnosis not present

## 2020-05-12 ENCOUNTER — Ambulatory Visit: Payer: 59 | Attending: General Surgery

## 2020-05-12 ENCOUNTER — Other Ambulatory Visit: Payer: Self-pay

## 2020-05-12 DIAGNOSIS — R293 Abnormal posture: Secondary | ICD-10-CM

## 2020-05-12 DIAGNOSIS — C50411 Malignant neoplasm of upper-outer quadrant of right female breast: Secondary | ICD-10-CM | POA: Diagnosis not present

## 2020-05-12 NOTE — Therapy (Signed)
[place], [place], [address] Phone: [phone]   Fax:  [phone]  Physical Therapy Treatment  Patient Details  Name: Nicole Khan MRN: [id number] Date of Birth: October 19, 1965 Referring Provider (PT): Dr. [name]   Encounter Date: 05/12/2020   PT End of Session - 05/12/20 1810    Visit Number 2    Number of Visits 3    Date for PT Re-Evaluation 06/23/20    PT Start Time 1000    PT Stop Time 1055    PT Time Calculation (min) 55 min    Activity Tolerance Patient tolerated treatment well    Behavior During Therapy [facility] for tasks assessed/performed           Past Medical History:  Diagnosis Date  . Abnormal mammogram of left breast 09/26/2017  . Abnormal Pap smear of cervix   . Anxiety   . Asthma    exercise induced asthma  . Cancer ([place])    Breast (right)  . Depression   . Diabetes mellitus without complication ([place])    type 2  . Family history of breast cancer 04/11/2020  . Family history of prostate cancer 04/11/2020  . Family history of thyroid cancer 04/11/2020  . Fracture dislocation of right ankle joint 06-04-2013  . Fracture of distal fibula 06-04-2013  . H/O bilateral salpingectomy 05/2015  . History of kidney stones    passed stones, no surgery required  . Hypercholesteremia   . Hypertension   . Polycystic ovary disease   . PONV (postoperative nausea and vomiting)   . Seasonal allergies   . Sleep apnea 05/2017   uses cpap  . SVD (spontaneous vaginal delivery)    x 1    Past Surgical History:  Procedure Laterality Date  . BREAST BIOPSY Right 02/29/2020  . BREAST EXCISIONAL BIOPSY Left 2019   Benign  . BREAST LUMPECTOMY WITH RADIOACTIVE SEED AND SENTINEL LYMPH NODE BIOPSY Right 04/20/2020   Procedure: RIGHT BREAST LUMPECTOMY WITH RADIOACTIVE SEED AND SENTINEL LYMPH NODE BIOPSY;  Surgeon: Jovita Kussmaul, MD;  Location: [place];  Service: General;  Laterality: Right;  . BREAST  LUMPECTOMY WITH RADIOACTIVE SEED LOCALIZATION Left 09/26/2017   Procedure: LEFT BREAST LUMPECTOMY WITH RADIOACTIVE SEED LOCALIZATION ERAS PATHWAY;  Surgeon: Fanny Skates, MD;  Location: [place];  Service: General;  Laterality: Left;  . CARPAL TUNNEL RELEASE     bilateral  . CERVICAL CERCLAGE  07/1991; 05/1999   x 2  . CESAREAN SECTION     x 1  . CHOLECYSTECTOMY, LAPAROSCOPIC  1996  . COLONOSCOPY WITH PROPOFOL N/A 06/24/2017   Procedure: COLONOSCOPY WITH PROPOFOL;  Surgeon: Juanita Craver, MD;  Location: [facility];  Service: Endoscopy;  Laterality: N/A;  . CYSTOSCOPY N/A 06/07/2014   Procedure: CYSTOSCOPY;  Surgeon: Megan Salon, MD;  Location: [facility];  Service: Gynecology;  Laterality: N/A;  . LAPAROSCOPIC BILATERAL SALPINGO OOPHERECTOMY Right 06/07/2014   Procedure: LAPAROSCOPIC RIGHT SALPINGO OOPHORECTOMY/COLLECTION OF PELVIC WASHINGS;  Surgeon: Megan Salon, MD;  Location: [facility];  Service: Gynecology;  Laterality: Right;  . LAPAROSCOPIC UNILATERAL SALPINGECTOMY Left 06/07/2014   Procedure: LAPAROSCOPIC UNILATERAL SALPINGECTOMY;  Surgeon: Megan Salon, MD;  Location: [facility];  Service: Gynecology;  Laterality: Left;  . ORIF ANKLE FRACTURE Right 06/07/2013   Procedure: OPEN REDUCTION INTERNAL FIXATION (ORIF) ANKLE FRACTURE  FIBULA SYNDESMOSIS;  Surgeon: Ninetta Lights, MD;  Location: [place];  Service: Orthopedics;  Laterality: Right;  There were no vitals filed for this visit.   Subjective Assessment - 05/12/20 1005    Subjective Pt. is here for follow up after Right breast lumpectomy with SLNB on 04/20/2020. Have not done the ABC class yet. Pt reports she did the exercises for about a week, but has not done them since. She had 2 LN removed. She will undergo another surgery on 06/01/2020 for re-excision to get clear margins. She feels like she has her motion back.  She will have radiation done.  She gets intermittent shooting pains in her breast.    Pertinent  History right breast cancer diagnosed with biopsy on 02/29/20 showed invasive ductal carcinoma with DCIS, grade 1, HER-2 negative (1+), ER/PR >95%, Ki 67 5%. Surgery is scheduled for Jan 20 and she will have radiation.  Past history includes, DM, sleep apnea, bilaterl carpal tunnel surgery( 2006) and right trigger finger surgery ( 2011)    Currently in Pain? No/denies              [facility] PT Assessment - 05/12/20 0001      AROM   Right Shoulder Extension 60 Degrees    Right Shoulder Flexion 155 Degrees    Right Shoulder ABduction 170 Degrees    Right Shoulder Internal Rotation 63 Degrees    Right Shoulder External Rotation 85 Degrees             LYMPHEDEMA/ONCOLOGY QUESTIONNAIRE - 05/12/20 0001      What other symptoms do you have   Are you Having Heaviness or Tightness No    Are you having Pain --   intermittent right breast pain   Are you having pitting edema No    Is it Hard or Difficult finding clothes that fit No      Right Upper Extremity Lymphedema   10 cm Proximal to Olecranon Process 35 cm    Olecranon Process 26.5 cm    15 cm Proximal to Ulnar Styloid Process 27.4 cm    Just Proximal to Ulnar Styloid Process 16.3 cm    Across Hand at [employer] 18.5 cm    At [place] of 2nd Digit 6.5 cm                      [facility] Adult PT Treatment/Exercise - 05/12/20 0001      Shoulder Exercises: Supine   Other Supine Exercises AA shoulder flexion x 5   clasped hands   Other Supine Exercises Star gazer x 5 supine      Shoulder Exercises: Standing   Other Standing Exercises wall slides for abd x 5      Manual Therapy   Manual therapy comments checked for cording. none visible but may have a small one mid axillary region which is not tender    Edema Management half inch foam cut and placed in stockinette x 2, one for each incision area of firmness.  Pt was instructed in gentle scar massage to each incision.  she was also advised to consider a sports bra to hold in  foam or compression bra however has concerns because she is claustrophobic    Passive ROM PROM right shoulder in all directions with VC's to pt to relax.                  PT Education - 05/12/20 1807    Education Details Pt was advised to perform AA shoulder flexion and stargazer in supine to get to full end  range.  We also discussed scar massage and she was given half inch foam in stockinette for each incision to help soften.  We discussed compression bra/sports bra as a means to hold foam in but pt expressed concerns because she is claustrophobic.  She may try and go [place] [employer] [place] some.    Person(s) Educated Patient    Methods Explanation    Comprehension Verbalized understanding;Returned demonstration                      Plan - 05/12/20 1811    Clinical Impression Statement Pt was reassessed today for shoulder ROM, arm circumference and incision sites were checked.  She has only mild limitations in shoulder ROM and will address these at home with her exercises so she has full motion before her next surgery for re-excision on 06/01/20.  There is no noted arm swelling today.  There may be one small deeper cord in axillary region but without tenderness.  Incision sites are healed with some fibrosis on each.  She was given 2 pieces of half inch foam in a stockinette to address these areas.  We also discussed compression bras versus sports bra to place foam into.  She did express concern with this secondary to claustrophobia, but it was suggested that she go to [employer] [place] to look at/try on. We scheduled a follow up visit on June 20, 2020 for recheck after her re-excision surgery. She will also need to schedule the ABC class.    Personal Factors and Comorbidities Comorbidity 2    Comorbidities DM, previous hand surgery    Stability/Clinical Decision Making Stable/Uncomplicated    Clinical Decision Making Low    Rehab Potential Excellent    PT Frequency 1x /  week    PT Duration 6 weeks   follow up visit after her surgery on 06/01/2020   PT Treatment/Interventions Therapeutic exercise;ADLs/Self Care Home Management;Patient/family education;Manual techniques;Passive range of motion    PT Next Visit Plan Reassess after re-excision surgery, check benefit of foam in bra.  Did she get compression bra    PT Home Exercise Plan post op exercises with AA flex and stargazer in supine. standing wall slides and scap. retraction    Consulted and Agree with Plan of Care Patient           Patient will benefit from skilled therapeutic intervention in order to improve the following deficits and impairments:     Visit Diagnosis: Abnormal posture  Malignant neoplasm of upper-outer quadrant of right female breast, unspecified estrogen receptor status ([place])     Problem List Patient Active Problem List   Diagnosis Date Noted  . Genetic testing 04/17/2020  . Family history of breast cancer 04/11/2020  . Family history of prostate cancer 04/11/2020  . Family history of thyroid cancer 04/11/2020  . Malignant neoplasm of upper-outer quadrant of right breast in female, estrogen receptor positive ([place]) 03/21/2020  . Abnormal mammogram of left breast 09/26/2017  . Bilateral ovarian cysts 05/23/2014  . Polycystic ovary disease   . Fracture of distal fibula 06/04/2013  . HTN (hypertension) 04/07/2013  . PCOS (polycystic ovarian syndrome) 04/07/2013  . Diabetes ([place]) 04/07/2013  . Renal calculi 04/07/2013  . Adjustment disorder with mixed anxiety and depressed mood 04/07/2013    Claris Pong 05/12/2020, 6:25 PM  [place], [place], [address] Phone: [phone]   Fax:  [phone]  Name: Kaidynce  Joyell Emami MRN: [id number] Date of Birth: [birth year] Cheral Almas, PT 05/12/20 6:28 PM

## 2020-05-17 DIAGNOSIS — J301 Allergic rhinitis due to pollen: Secondary | ICD-10-CM | POA: Diagnosis not present

## 2020-05-17 DIAGNOSIS — J3089 Other allergic rhinitis: Secondary | ICD-10-CM | POA: Diagnosis not present

## 2020-05-17 DIAGNOSIS — J3081 Allergic rhinitis due to animal (cat) (dog) hair and dander: Secondary | ICD-10-CM | POA: Diagnosis not present

## 2020-05-24 ENCOUNTER — Ambulatory Visit: Payer: 59

## 2020-05-24 ENCOUNTER — Ambulatory Visit: Payer: 59 | Admitting: Radiation Oncology

## 2020-05-24 ENCOUNTER — Other Ambulatory Visit (HOSPITAL_COMMUNITY): Payer: Self-pay | Admitting: Family Medicine

## 2020-05-24 MED FILL — spIRONOLACTONE 100 MG TAB: 100 | 90 days supply | Qty: 180 | Fill #4

## 2020-05-24 MED FILL — buPROPion HCL ER (XL) 300 M: 300 | 90 days supply | Qty: 90 | Fill #0

## 2020-05-25 ENCOUNTER — Ambulatory Visit: Payer: 59 | Admitting: Radiation Oncology

## 2020-05-29 ENCOUNTER — Other Ambulatory Visit (HOSPITAL_COMMUNITY)
Admission: RE | Admit: 2020-05-29 | Discharge: 2020-05-29 | Disposition: A | Discharge status: Home / Self Care | Payer: 59 | Source: Ambulatory Visit | Attending: General Surgery | Admitting: General Surgery

## 2020-05-29 DIAGNOSIS — J3089 Other allergic rhinitis: Secondary | ICD-10-CM | POA: Diagnosis not present

## 2020-05-29 DIAGNOSIS — Z20822 Contact with and (suspected) exposure to covid-19: Secondary | ICD-10-CM | POA: Insufficient documentation

## 2020-05-29 DIAGNOSIS — J3081 Allergic rhinitis due to animal (cat) (dog) hair and dander: Secondary | ICD-10-CM | POA: Diagnosis not present

## 2020-05-29 DIAGNOSIS — Z01812 Encounter for preprocedural laboratory examination: Secondary | ICD-10-CM | POA: Insufficient documentation

## 2020-05-29 DIAGNOSIS — J301 Allergic rhinitis due to pollen: Secondary | ICD-10-CM | POA: Diagnosis not present

## 2020-05-30 LAB — SARS CORONAVIRUS 2 (TAT 6-24 HRS): SARS Coronavirus 2: NEGATIVE

## 2020-06-01 ENCOUNTER — Other Ambulatory Visit: Payer: Self-pay

## 2020-06-01 ENCOUNTER — Other Ambulatory Visit (HOSPITAL_COMMUNITY): Payer: Self-pay | Admitting: Family Medicine

## 2020-06-01 ENCOUNTER — Ambulatory Visit (HOSPITAL_COMMUNITY): Payer: 59 | Admitting: Certified Registered Nurse Anesthetist

## 2020-06-01 ENCOUNTER — Encounter (HOSPITAL_COMMUNITY): Payer: Self-pay | Admitting: General Surgery

## 2020-06-01 ENCOUNTER — Encounter (HOSPITAL_COMMUNITY)
Admission: RE | Disposition: A | Discharge status: Home / Self Care | Payer: Self-pay | Source: Home / Self Care | Attending: General Surgery

## 2020-06-01 ENCOUNTER — Ambulatory Visit (HOSPITAL_COMMUNITY)
Admission: RE | Admit: 2020-06-01 | Discharge: 2020-06-01 | Disposition: A | Discharge status: Home / Self Care | Payer: 59 | Attending: General Surgery | Admitting: General Surgery

## 2020-06-01 DIAGNOSIS — I10 Essential (primary) hypertension: Secondary | ICD-10-CM | POA: Diagnosis not present

## 2020-06-01 DIAGNOSIS — Z170 Estrogen receptor positive status [ER+]: Secondary | ICD-10-CM | POA: Insufficient documentation

## 2020-06-01 DIAGNOSIS — C50411 Malignant neoplasm of upper-outer quadrant of right female breast: Secondary | ICD-10-CM | POA: Insufficient documentation

## 2020-06-01 DIAGNOSIS — E119 Type 2 diabetes mellitus without complications: Secondary | ICD-10-CM | POA: Diagnosis not present

## 2020-06-01 DIAGNOSIS — N6011 Diffuse cystic mastopathy of right breast: Secondary | ICD-10-CM | POA: Insufficient documentation

## 2020-06-01 HISTORY — PX: RE-EXCISION OF BREAST CANCER,SUPERIOR MARGINS: SHX6047

## 2020-06-01 LAB — BASIC METABOLIC PANEL
Anion gap: 13 (ref 5–15)
BUN: 15 mg/dL (ref 6–20)
CO2: 20 mmol/L — ABNORMAL LOW (ref 22–32)
Calcium: 10 mg/dL (ref 8.9–10.3)
Chloride: 103 mmol/L (ref 98–111)
Creatinine, Ser: 1.31 mg/dL — ABNORMAL HIGH (ref 0.44–1.00)
GFR, Estimated: 49 mL/min — ABNORMAL LOW (ref >60)
Glucose, Bld: 104 mg/dL — ABNORMAL HIGH (ref 70–99)
Potassium: 3.7 mmol/L (ref 3.5–5.1)
Sodium: 136 mmol/L (ref 135–145)

## 2020-06-01 LAB — GLUCOSE, CAPILLARY
Glucose-Capillary: 105 mg/dL — ABNORMAL HIGH (ref 70–99)
Glucose-Capillary: 91 mg/dL (ref 70–99)

## 2020-06-01 SURGERY — RE-EXCISION OF BREAST CANCER,SUPERIOR MARGINS
Anesthesia: General | Site: Breast | Laterality: Right

## 2020-06-01 MED ORDER — LIDOCAINE 2% (20 MG/ML) 5 ML SYRINGE
INTRAMUSCULAR | Status: AC
  Filled 2020-06-01: qty 5

## 2020-06-01 MED ORDER — ONDANSETRON HCL 4 MG/2ML IJ SOLN
INTRAMUSCULAR | Status: DC | PRN
Start: 2020-06-01 — End: 2020-06-01
  Administered 2020-06-01: 4 mg via INTRAVENOUS

## 2020-06-01 MED ORDER — PROPOFOL 10 MG/ML IV BOLUS
INTRAVENOUS | Status: AC
  Filled 2020-06-01: qty 20

## 2020-06-01 MED ORDER — PROPOFOL 10 MG/ML IV BOLUS
INTRAVENOUS | Status: DC | PRN
  Administered 2020-06-01: 180 mg via INTRAVENOUS
  Administered 2020-06-01: 20 mg via INTRAVENOUS
  Administered 2020-06-01: 50 mg via INTRAVENOUS

## 2020-06-01 MED ORDER — PROMETHAZINE HCL 25 MG/ML IJ SOLN: 6.2500 mg | INTRAMUSCULAR | Status: DC | PRN

## 2020-06-01 MED ORDER — GABAPENTIN 300 MG PO CAPS
300.0000 mg | ORAL_CAPSULE | ORAL | Status: AC
  Administered 2020-06-01: 300 mg via ORAL
  Filled 2020-06-01: qty 1

## 2020-06-01 MED ORDER — ONDANSETRON HCL 4 MG/2ML IJ SOLN
INTRAMUSCULAR | Status: AC
  Filled 2020-06-01: qty 2

## 2020-06-01 MED ORDER — LIDOCAINE 2% (20 MG/ML) 5 ML SYRINGE
INTRAMUSCULAR | Status: DC | PRN
  Administered 2020-06-01: 60 mg via INTRAVENOUS

## 2020-06-01 MED ORDER — CEFAZOLIN SODIUM-DEXTROSE 2-4 GM/100ML-% IV SOLN
2.0000 g | INTRAVENOUS | Status: AC
  Administered 2020-06-01: 2 g via INTRAVENOUS
  Filled 2020-06-01: qty 100

## 2020-06-01 MED ORDER — FENTANYL CITRATE (PF) 250 MCG/5ML IJ SOLN
INTRAMUSCULAR | Status: AC
  Filled 2020-06-01: qty 5

## 2020-06-01 MED ORDER — FENTANYL CITRATE (PF) 250 MCG/5ML IJ SOLN
INTRAMUSCULAR | Status: DC | PRN
  Administered 2020-06-01: 25 ug via INTRAVENOUS
  Administered 2020-06-01: 50 ug via INTRAVENOUS

## 2020-06-01 MED ORDER — FENTANYL CITRATE (PF) 100 MCG/2ML IJ SOLN: 25.0000 ug | INTRAMUSCULAR | Status: DC | PRN

## 2020-06-01 MED ORDER — CHLORHEXIDINE GLUCONATE 0.12 % MT SOLN
15.0000 mL | Freq: Once | OROMUCOSAL | Status: AC
  Administered 2020-06-01: 15 mL via OROMUCOSAL

## 2020-06-01 MED ORDER — PHENYLEPHRINE 40 MCG/ML (10ML) SYRINGE FOR IV PUSH (FOR BLOOD PRESSURE SUPPORT)
PREFILLED_SYRINGE | INTRAVENOUS | Status: DC | PRN
  Administered 2020-06-01 (×2): 80 ug via INTRAVENOUS
  Administered 2020-06-01: 160 ug via INTRAVENOUS
  Administered 2020-06-01 (×2): 120 ug via INTRAVENOUS
  Administered 2020-06-01: 160 ug via INTRAVENOUS

## 2020-06-01 MED ORDER — DEXAMETHASONE SODIUM PHOSPHATE 10 MG/ML IJ SOLN
INTRAMUSCULAR | Status: AC
  Filled 2020-06-01: qty 1

## 2020-06-01 MED ORDER — CELECOXIB 200 MG PO CAPS
200.0000 mg | ORAL_CAPSULE | ORAL | Status: AC
  Administered 2020-06-01: 200 mg via ORAL
  Filled 2020-06-01: qty 1

## 2020-06-01 MED ORDER — HYDROCODONE-ACETAMINOPHEN 5-325 MG PO TABS: 1.0000 | ORAL_TABLET | Freq: Four times a day (QID) | ORAL | 0 refills | Status: DC | PRN

## 2020-06-01 MED ORDER — CHLORHEXIDINE GLUCONATE CLOTH 2 % EX PADS: 6.0000 | MEDICATED_PAD | Freq: Once | CUTANEOUS | Status: DC

## 2020-06-01 MED ORDER — EPHEDRINE SULFATE-NACL 50-0.9 MG/10ML-% IV SOSY: PREFILLED_SYRINGE | INTRAVENOUS | Status: DC | PRN

## 2020-06-01 MED ORDER — PHENYLEPHRINE 40 MCG/ML (10ML) SYRINGE FOR IV PUSH (FOR BLOOD PRESSURE SUPPORT)
PREFILLED_SYRINGE | INTRAVENOUS | Status: AC
  Filled 2020-06-01: qty 20

## 2020-06-01 MED ORDER — EPHEDRINE SULFATE-NACL 50-0.9 MG/10ML-% IV SOSY
PREFILLED_SYRINGE | INTRAVENOUS | Status: DC | PRN
  Administered 2020-06-01: 10 mg via INTRAVENOUS

## 2020-06-01 MED ORDER — OXYCODONE HCL 5 MG PO TABS: 5.0000 mg | ORAL_TABLET | Freq: Once | ORAL | Status: DC | PRN

## 2020-06-01 MED ORDER — DEXAMETHASONE SODIUM PHOSPHATE 10 MG/ML IJ SOLN
INTRAMUSCULAR | Status: DC | PRN
  Administered 2020-06-01: 5 mg via INTRAVENOUS

## 2020-06-01 MED ORDER — MIDAZOLAM HCL 2 MG/2ML IJ SOLN
INTRAMUSCULAR | Status: DC | PRN
  Administered 2020-06-01: 2 mg via INTRAVENOUS

## 2020-06-01 MED ORDER — LACTATED RINGERS IV SOLN: INTRAVENOUS | Status: DC

## 2020-06-01 MED ORDER — BUPIVACAINE HCL (PF) 0.25 % IJ SOLN
INTRAMUSCULAR | Status: AC
  Filled 2020-06-01: qty 30

## 2020-06-01 MED ORDER — MIDAZOLAM HCL 2 MG/2ML IJ SOLN
INTRAMUSCULAR | Status: AC
  Filled 2020-06-01: qty 2

## 2020-06-01 MED ORDER — OXYCODONE HCL 5 MG/5ML PO SOLN: 5.0000 mg | Freq: Once | ORAL | Status: DC | PRN

## 2020-06-01 MED ORDER — ACETAMINOPHEN 500 MG PO TABS
1000.0000 mg | ORAL_TABLET | ORAL | Status: AC
  Administered 2020-06-01: 1000 mg via ORAL
  Filled 2020-06-01: qty 2

## 2020-06-01 MED ORDER — ORAL CARE MOUTH RINSE: 15.0000 mL | Freq: Once | OROMUCOSAL | Status: AC

## 2020-06-01 MED ORDER — 0.9 % SODIUM CHLORIDE (POUR BTL) OPTIME
TOPICAL | Status: DC | PRN
  Administered 2020-06-01: 1000 mL

## 2020-06-01 MED ORDER — BUPIVACAINE HCL (PF) 0.25 % IJ SOLN
INTRAMUSCULAR | Status: DC | PRN
  Administered 2020-06-01: 20 mL

## 2020-06-01 MED ORDER — ALBUTEROL SULFATE HFA 108 (90 BASE) MCG/ACT IN AERS
INHALATION_SPRAY | RESPIRATORY_TRACT | Status: DC | PRN
  Administered 2020-06-01: 2 via RESPIRATORY_TRACT

## 2020-06-01 MED FILL — HYDROCODON-APAP 5-325: 5-325 | 2 days supply | Qty: 10 | Fill #0

## 2020-06-01 MED FILL — METOPROLOL TARTRATE 100 MG: 100 | 90 days supply | Qty: 180 | Fill #0

## 2020-06-01 MED FILL — JARDIANCE 10 MG TABLET: 10 | 30 days supply | Qty: 30 | Fill #1

## 2020-06-01 MED FILL — CITALOPRAM HBR 20 MG TABLET: 20 | 90 days supply | Qty: 90 | Fill #0

## 2020-06-01 SURGICAL SUPPLY — 30 items
APPLIER CLIP 9.375 MED OPEN (MISCELLANEOUS)
BINDER BREAST XXLRG (GAUZE/BANDAGES/DRESSINGS) ×2 IMPLANT
CHLORAPREP W/TINT 26 (MISCELLANEOUS) ×2 IMPLANT
CLIP APPLIE 9.375 MED OPEN (MISCELLANEOUS) IMPLANT
CNTNR URN SCR LID CUP LEK RST (MISCELLANEOUS) ×1 IMPLANT
CONT SPEC 4OZ STRL OR WHT (MISCELLANEOUS) ×2
COVER SURGICAL LIGHT HANDLE (MISCELLANEOUS) ×2 IMPLANT
COVER WAND RF STERILE (DRAPES) ×2 IMPLANT
DERMABOND ADVANCED (GAUZE/BANDAGES/DRESSINGS) ×1
DERMABOND ADVANCED .7 DNX12 (GAUZE/BANDAGES/DRESSINGS) ×1 IMPLANT
DRAPE CHEST BREAST 15X10 FENES (DRAPES) ×2 IMPLANT
ELECT COATED BLADE 2.86 ST (ELECTRODE) ×2 IMPLANT
ELECT REM PT RETURN 9FT ADLT (ELECTROSURGICAL) ×2
ELECTRODE REM PT RTRN 9FT ADLT (ELECTROSURGICAL) ×1 IMPLANT
GAUZE SPONGE 4X4 12PLY STRL (GAUZE/BANDAGES/DRESSINGS) ×2 IMPLANT
GOWN STRL REUS W/ TWL LRG LVL3 (GOWN DISPOSABLE) ×2 IMPLANT
GOWN STRL REUS W/TWL LRG LVL3 (GOWN DISPOSABLE) ×4
KIT BASIN OR (CUSTOM PROCEDURE TRAY) ×2 IMPLANT
KIT TURNOVER KIT B (KITS) ×2 IMPLANT
NEEDLE HYPO 25GX1X1/2 BEV (NEEDLE) ×2 IMPLANT
NS IRRIG 1000ML POUR BTL (IV SOLUTION) ×2 IMPLANT
PACK GENERAL/GYN (CUSTOM PROCEDURE TRAY) ×2 IMPLANT
PAD ARMBOARD 7.5X6 YLW CONV (MISCELLANEOUS) ×2 IMPLANT
PENCIL SMOKE EVACUATOR (MISCELLANEOUS) ×2 IMPLANT
SUT MNCRL AB 4-0 PS2 18 (SUTURE) ×2 IMPLANT
SUT SILK 2 0 SH (SUTURE) ×2 IMPLANT
SUT VIC AB 3-0 SH 18 (SUTURE) ×2 IMPLANT
SYR CONTROL 10ML LL (SYRINGE) ×2 IMPLANT
TOWEL GREEN STERILE (TOWEL DISPOSABLE) ×2 IMPLANT
TOWEL GREEN STERILE FF (TOWEL DISPOSABLE) ×2 IMPLANT

## 2020-06-01 NOTE — Transfer of Care (Signed)
Immediate Anesthesia Transfer of Care Note  Patient: Nicole Khan  Procedure(s) Performed: RIGHT BREAST MARGIN REEXCISION (Right Breast)  Patient Location: PACU  Anesthesia Type:General  Level of Consciousness: awake, alert  and oriented  Airway & Oxygen Therapy: Patient Spontanous Breathing and Patient connected to face mask oxygen  Post-op Assessment: Report given to RN and Post -op Vital signs reviewed and stable  Post vital signs: Reviewed and stable  Last Vitals:  Vitals Value Taken Time  BP 116/73 06/01/20 0942  Temp    Pulse 89 06/01/20 0945  Resp 21 06/01/20 0945  SpO2 97 % 06/01/20 0945  Vitals shown include unvalidated device data.  Last Pain:  Vitals:   06/01/20 0731  TempSrc:   PainSc: 0-No pain     Nasal Airway in place.     Complications: No complications documented.

## 2020-06-01 NOTE — Anesthesia Preprocedure Evaluation (Addendum)
Anesthesia Evaluation  Patient identified by MRN, date of birth, ID band Patient awake    Reviewed: Allergy & Precautions, NPO status , Patient's Chart, lab work & pertinent test results  History of Anesthesia Complications (+) PONV and history of anesthetic complications  Airway Mallampati: II  TM Distance: >3 FB Neck ROM: Full    Dental  (+) Dental Advisory Given   Pulmonary asthma , sleep apnea and Continuous Positive Airway Pressure Ventilation ,    Pulmonary exam normal        Cardiovascular hypertension, Pt. on home beta blockers and Pt. on medications Normal cardiovascular exam     Neuro/Psych PSYCHIATRIC DISORDERS Anxiety Depression negative neurological ROS     GI/Hepatic negative GI ROS, Neg liver ROS,   Endo/Other  diabetes, Type 2, Oral Hypoglycemic AgentsObesity   Renal/GU negative Renal ROS     Musculoskeletal negative musculoskeletal ROS (+)   Abdominal (+) + obese,   Peds  Hematology negative hematology ROS (+)   Anesthesia Other Findings Covid test negative   Reproductive/Obstetrics  PCOS                             Anesthesia Physical Anesthesia Plan  ASA: II  Anesthesia Plan: General   Post-op Pain Management:    Induction: Intravenous  PONV Risk Score and Plan: 4 or greater and Treatment may vary due to age or medical condition, Ondansetron, Midazolam, Dexamethasone and Scopolamine patch - Pre-op  Airway Management Planned: LMA  Additional Equipment: None  Intra-op Plan:   Post-operative Plan: Extubation in OR  Informed Consent: I have reviewed the patients History and Physical, chart, labs and discussed the procedure including the risks, benefits and alternatives for the proposed anesthesia with the patient or authorized representative who has indicated his/her understanding and acceptance.     Dental advisory given  Plan Discussed with: CRNA and  Anesthesiologist  Anesthesia Plan Comments:        Anesthesia Quick Evaluation

## 2020-06-01 NOTE — Interval H&P Note (Signed)
History and Physical Interval Note:  06/01/2020 8:09 AM  Nicole Khan  has presented today for surgery, with the diagnosis of RIGHT BREAST CANCER.  The various methods of treatment have been discussed with the patient and family. After consideration of risks, benefits and other options for treatment, the patient has consented to  Procedure(s): RIGHT BREAST MARGIN REEXCISION (Right) as a surgical intervention.  The patient's history has been reviewed, patient examined, no change in status, stable for surgery.  I have reviewed the patient's chart and labs.  Questions were answered to the patient's satisfaction.     [name]

## 2020-06-01 NOTE — Anesthesia Postprocedure Evaluation (Signed)
Anesthesia Post Note  Patient: Nicole Khan  Procedure(s) Performed: RIGHT BREAST MARGIN REEXCISION (Right Breast)     Patient location during evaluation: PACU Anesthesia Type: General Level of consciousness: awake and alert Pain management: pain level controlled Vital Signs Assessment: post-procedure vital signs reviewed and stable Respiratory status: spontaneous breathing, nonlabored ventilation, respiratory function stable and patient connected to nasal cannula oxygen Cardiovascular status: blood pressure returned to baseline and stable Postop Assessment: no apparent nausea or vomiting Anesthetic complications: no   No complications documented.  Last Vitals:  Vitals:   06/01/20 0945 06/01/20 1112  BP:  97/64  Pulse: 86 89  Resp: 17 (!) 21  Temp:  36.6 C  SpO2: 100% 93%    Last Pain:  Vitals:   06/01/20 1112  TempSrc:   PainSc: 0-No pain                 Audry Pili

## 2020-06-01 NOTE — Op Note (Signed)
06/01/2020  9:22 AM  PATIENT:  Nicole Khan  55 y.o. female  PRE-OPERATIVE DIAGNOSIS:  RIGHT BREAST CANCER  POST-OPERATIVE DIAGNOSIS:  RIGHT BREAST CANCER  PROCEDURE:  Procedure(s): RIGHT BREAST MARGIN REEXCISION (Right)  SURGEON:  Surgeon(s) and Role:    * Jovita Kussmaul, MD - Primary  PHYSICIAN ASSISTANT:   ASSISTANTS: none   ANESTHESIA:   local and general  EBL:  minimal   BLOOD ADMINISTERED:none  DRAINS: none   LOCAL MEDICATIONS USED:  MARCAINE     SPECIMEN:  Source of Specimen:  right breast lateral deep, medial deep, and anterior margin  DISPOSITION OF SPECIMEN:  PATHOLOGY  COUNTS:  YES  TOURNIQUET:  * No tourniquets in log *  DICTATION: .Dragon Dictation   After informed consent was obtained the patient was brought to the operating room and placed in the supine position on the operating table.  After adequate induction of general anesthesia the patient's right breast was prepped with ChloraPrep, allowed to dry, and draped in usual sterile manner.  An appropriate timeout was performed.  The area around her previous incision was infiltrated with quarter percent Marcaine.  The curvilinear periareolar incision on the outer aspect of the right breast was reopened sharply with a 15 blade knife.  Blunt hemostat dissection was carried out until the previous lumpectomy cavity was identified.  The cavity was opened sharply with the electrocautery.  The deep margin was taken in 2 separate specimens.  The first was the medial and deep margin and the second was the lateral and deep margin.  These were marked appropriately with the paint colors.  A third margin anteriorly was also removed sharply with the electrocautery and marked appropriately.  These were sent to pathology for further evaluation.  Hemostasis was achieved using the Bovie electrocautery.  The wound was irrigated with saline and infiltrated with more quarter percent Marcaine.  The cavity was marked with clips.   The deep layer of the cavity was then closed with layers of interrupted 3-0 Vicryl stitches.  The skin was closed with interrupted 4-0 Monocryl subcuticular stitches.  Dermabond dressings were applied.  The patient tolerated the procedure well.  At the end of the case all needle sponge and instrument counts were correct.  The patient was then awakened and taken to recovery in stable condition.  PLAN OF CARE: Discharge to home after PACU  PATIENT DISPOSITION:  PACU - hemodynamically stable.   Delay start of Pharmacological VTE agent (>24hrs) due to surgical blood loss or risk of bleeding: not applicable

## 2020-06-01 NOTE — Anesthesia Procedure Notes (Signed)
Procedure Name: LMA Insertion Date/Time: 06/01/2020 8:38 AM Performed by: Dorthea Cove, CRNA Pre-anesthesia Checklist: Patient identified, Emergency Drugs available, Suction available and Patient being monitored Patient Re-evaluated:Patient Re-evaluated prior to induction Oxygen Delivery Method: Circle System Utilized Preoxygenation: Pre-oxygenation with 100% oxygen Induction Type: IV induction Ventilation: Mask ventilation without difficulty LMA: LMA inserted LMA Size: 4.0 Number of attempts: 1 Airway Equipment and Method: Bite block Placement Confirmation: positive ETCO2 Tube secured with: Tape Dental Injury: Teeth and Oropharynx as per pre-operative assessment

## 2020-06-01 NOTE — H&P (Signed)
Nicole Khan  Location: [facility] Patient #: [id number] DOB: [birth year] Married / Language: English / Race: White Female   History of Present Illness  The patient is a 55 year old female who presents for a follow-up for Breast cancer. The patient is a 55 year old white female who is about 2 weeks status post right breast lumpectomy and sentinel node biopsy for a T1b N0 right breast cancer that was ER and PR positive and HER-2 negative with a Ki-67 of 5%. The lymph nodes were negative and the invasive margins were clean. She did have DCIS that came up to the posterior margin which will need to be reexcised on March 3. She tolerated the surgery well. She denies any significant breast pain.   Allergies  No Known Drug Allergies   Medication History Jardiance (10MG Tablet, Oral) Active. metFORMIN HCl ER (500MG Tablet ER 24HR, Oral) Active. Trulicity (2.7NT/7.0YF Soln Pen-inj, Subcutaneous) Active. Metoprolol Tartrate (100MG Tablet, Oral) Active. Atorvastatin Calcium (40MG Tablet, Oral) Active. BuPROPion HCl ER (XL) (300MG Tablet ER 24HR, Oral) Active. Citalopram Hydrobromide (20MG Tablet, Oral) Active. Spironolactone (100MG Tablet, Oral) Active. Vitamin D (Ergocalciferol) (50000UNIT Capsule, Oral) Active. Medications Reconciled    Review of Systems  General Present- Night Sweats. Not Present- Appetite Loss, Chills, Fatigue, Fever, Weight Gain and Weight Loss. Skin Not Present- Change in Wart/Mole, Dryness, Hives, Jaundice, New Lesions, Non-Healing Wounds, Rash and Ulcer. HEENT Present- Seasonal Allergies and Wears glasses/contact lenses. Not Present- Earache, Hearing Loss, Hoarseness, Nose Bleed, Oral Ulcers, Ringing in the Ears, Sinus Pain, Sore Throat, Visual Disturbances and Yellow Eyes. Respiratory Present- Snoring. Not Present- Bloody sputum, Chronic Cough, Difficulty Breathing and Wheezing. Breast Present- Breast Mass. Not Present- Breast Pain,  Nipple Discharge and Skin Changes. Cardiovascular Not Present- Chest Pain, Difficulty Breathing Lying Down, Leg Cramps, Palpitations, Rapid Heart Rate, Shortness of Breath and Swelling of Extremities. Gastrointestinal Not Present- Abdominal Pain, Bloating, Bloody Stool, Change in Bowel Habits, Chronic diarrhea, Constipation, Difficulty Swallowing, Excessive gas, Gets full quickly at meals, Hemorrhoids, Indigestion, Nausea, Rectal Pain and Vomiting. Female Genitourinary Not Present- Frequency, Nocturia, Painful Urination, Pelvic Pain and Urgency. Musculoskeletal Not Present- Back Pain, Joint Pain, Joint Stiffness, Muscle Pain, Muscle Weakness and Swelling of Extremities. Neurological Not Present- Decreased Memory, Fainting, Headaches, Numbness, Seizures, Tingling, Tremor, Trouble walking and Weakness. Psychiatric Present- Anxiety and Depression. Not Present- Bipolar, Change in Sleep Pattern, Fearful and Frequent crying. Endocrine Present- Hair Changes and Heat Intolerance. Not Present- Cold Intolerance, Excessive Hunger, Hot flashes and New Diabetes.   Physical Exam General Mental Status-Alert. General Appearance-Consistent with stated age. Hydration-Well hydrated. Voice-Normal.  Head and Neck Head-normocephalic, atraumatic with no lesions or palpable masses. Trachea-midline. Thyroid Gland Characteristics - normal size and consistency.  Eye Eyeball - Bilateral-Extraocular movements intact. Sclera/Conjunctiva - Bilateral-No scleral icterus.  Chest and Lung Exam Chest and lung exam reveals -quiet, even and easy respiratory effort with no use of accessory muscles and on auscultation, normal breath sounds, no adventitious sounds and normal vocal resonance. Inspection Chest Wall - Normal. Back - normal.  Breast Note: The right breast periareolar and axillary incisions are healing nicely with no sign of infection or seroma.   Cardiovascular Cardiovascular examination  reveals -normal heart sounds, regular rate and rhythm with no murmurs and normal pedal pulses bilaterally.  Abdomen Inspection Inspection of the abdomen reveals - No Hernias. Skin - Scar - no surgical scars. Palpation/Percussion Palpation and Percussion of the abdomen reveal - Soft, Non Tender, No Rebound tenderness, No Rigidity (guarding) and  No hepatosplenomegaly. Auscultation Auscultation of the abdomen reveals - Bowel sounds normal.  Neurologic Neurologic evaluation reveals -alert and oriented x 3 with no impairment of recent or remote memory. Mental Status-Normal.  Musculoskeletal Normal Exam - Left-Upper Extremity Strength Normal and Lower Extremity Strength Normal. Normal Exam - Right-Upper Extremity Strength Normal and Lower Extremity Strength Normal.  Lymphatic Head & Neck  General Head & Neck Lymphatics: Bilateral - Description - Normal. Axillary  General Axillary Region: Bilateral - Description - Normal. Tenderness - Non Tender. Femoral & Inguinal  Generalized Femoral & Inguinal Lymphatics: Bilateral - Description - Normal. Tenderness - Non Tender.    Assessment & Plan  MALIGNANT NEOPLASM OF UPPER-OUTER QUADRANT OF RIGHT BREAST IN FEMALE, ESTROGEN RECEPTOR POSITIVE (C50.411) Impression: The patient is about 2 weeks status post right breast lumpectomy for breast cancer. Her nodes were negative and her margins were cleaned for the invasive cancer but she did have ductal carcinoma in situ that came up to the posterior margin. Because of this she will need to go back for reexcision of the posterior margin. I have discussed with her in detail the risks and benefits of the operation as well as some of the technical aspects and she understands and wishes to proceed. She will also continue to follow with medical and radiation oncology for adjuvant therapy.

## 2020-06-02 ENCOUNTER — Encounter (HOSPITAL_COMMUNITY): Payer: Self-pay | Admitting: General Surgery

## 2020-06-05 ENCOUNTER — Encounter: Payer: Self-pay | Admitting: *Deleted

## 2020-06-05 LAB — SURGICAL PATHOLOGY

## 2020-06-08 DIAGNOSIS — J3089 Other allergic rhinitis: Secondary | ICD-10-CM | POA: Diagnosis not present

## 2020-06-08 DIAGNOSIS — J301 Allergic rhinitis due to pollen: Secondary | ICD-10-CM | POA: Diagnosis not present

## 2020-06-08 DIAGNOSIS — J3081 Allergic rhinitis due to animal (cat) (dog) hair and dander: Secondary | ICD-10-CM | POA: Diagnosis not present

## 2020-06-08 MED FILL — METFORMIN HCL ER 500 MG TB2: 500 | 90 days supply | Qty: 360 | Fill #3

## 2020-06-14 DIAGNOSIS — J3081 Allergic rhinitis due to animal (cat) (dog) hair and dander: Secondary | ICD-10-CM | POA: Diagnosis not present

## 2020-06-14 DIAGNOSIS — J3089 Other allergic rhinitis: Secondary | ICD-10-CM | POA: Diagnosis not present

## 2020-06-14 DIAGNOSIS — J301 Allergic rhinitis due to pollen: Secondary | ICD-10-CM | POA: Diagnosis not present

## 2020-06-20 ENCOUNTER — Ambulatory Visit: Payer: 59 | Attending: General Surgery

## 2020-06-20 ENCOUNTER — Encounter: Payer: Self-pay | Admitting: Radiation Oncology

## 2020-06-20 ENCOUNTER — Other Ambulatory Visit: Payer: Self-pay

## 2020-06-20 DIAGNOSIS — J3089 Other allergic rhinitis: Secondary | ICD-10-CM | POA: Diagnosis not present

## 2020-06-20 DIAGNOSIS — R609 Edema, unspecified: Secondary | ICD-10-CM | POA: Insufficient documentation

## 2020-06-20 DIAGNOSIS — R293 Abnormal posture: Secondary | ICD-10-CM | POA: Diagnosis not present

## 2020-06-20 DIAGNOSIS — C50411 Malignant neoplasm of upper-outer quadrant of right female breast: Secondary | ICD-10-CM | POA: Diagnosis not present

## 2020-06-20 DIAGNOSIS — J301 Allergic rhinitis due to pollen: Secondary | ICD-10-CM | POA: Diagnosis not present

## 2020-06-20 DIAGNOSIS — Z483 Aftercare following surgery for neoplasm: Secondary | ICD-10-CM | POA: Diagnosis not present

## 2020-06-20 DIAGNOSIS — J3081 Allergic rhinitis due to animal (cat) (dog) hair and dander: Secondary | ICD-10-CM | POA: Diagnosis not present

## 2020-06-20 NOTE — Therapy (Signed)
[place], [place], [address] Phone: [phone]   Fax:  [phone]  Physical Therapy Treatment  Patient Details  Name: Nicole Khan MRN: [id number] Date of Birth: 08-17-1965 Referring Provider (PT): Dr. [name]   Encounter Date: 06/20/2020   PT End of Session - 06/20/20 1153    Visit Number 3    Number of Visits 6    Date for PT Re-Evaluation 08/01/20    PT Start Time 1104    PT Stop Time 1151    PT Time Calculation (min) 47 min    Activity Tolerance Patient tolerated treatment well    Behavior During Therapy [facility] for tasks assessed/performed           Past Medical History:  Diagnosis Date  . Abnormal mammogram of left breast 09/26/2017  . Abnormal Pap smear of cervix   . Anxiety   . Asthma    exercise induced asthma  . Cancer ([place])    Breast (right)  . Depression   . Diabetes mellitus without complication ([place])    type 2  . Family history of breast cancer 04/11/2020  . Family history of prostate cancer 04/11/2020  . Family history of thyroid cancer 04/11/2020  . Fracture dislocation of right ankle joint 06-04-2013  . Fracture of distal fibula 06-04-2013  . H/O bilateral salpingectomy 05/2015  . History of kidney stones    passed stones, no surgery required  . Hypercholesteremia   . Hypertension   . Polycystic ovary disease   . PONV (postoperative nausea and vomiting)   . Seasonal allergies   . Sleep apnea 05/2017   uses cpap  . SVD (spontaneous vaginal delivery)    x 1    Past Surgical History:  Procedure Laterality Date  . BREAST BIOPSY Right 02/29/2020  . BREAST EXCISIONAL BIOPSY Left 2019   Benign  . BREAST LUMPECTOMY WITH RADIOACTIVE SEED AND SENTINEL LYMPH NODE BIOPSY Right 04/20/2020   Procedure: RIGHT BREAST LUMPECTOMY WITH RADIOACTIVE SEED AND SENTINEL LYMPH NODE BIOPSY;  Surgeon: Jovita Kussmaul, MD;  Location: [place];  Service: General;  Laterality: Right;  . BREAST  LUMPECTOMY WITH RADIOACTIVE SEED LOCALIZATION Left 09/26/2017   Procedure: LEFT BREAST LUMPECTOMY WITH RADIOACTIVE SEED LOCALIZATION ERAS PATHWAY;  Surgeon: Fanny Skates, MD;  Location: [place];  Service: General;  Laterality: Left;  . CARPAL TUNNEL RELEASE     bilateral  . CERVICAL CERCLAGE  07/1991; 05/1999   x 2  . CESAREAN SECTION     x 1  . CHOLECYSTECTOMY, LAPAROSCOPIC  1996  . COLONOSCOPY WITH PROPOFOL N/A 06/24/2017   Procedure: COLONOSCOPY WITH PROPOFOL;  Surgeon: Juanita Craver, MD;  Location: [facility];  Service: Endoscopy;  Laterality: N/A;  . CYSTOSCOPY N/A 06/07/2014   Procedure: CYSTOSCOPY;  Surgeon: Megan Salon, MD;  Location: [facility];  Service: Gynecology;  Laterality: N/A;  . LAPAROSCOPIC BILATERAL SALPINGO OOPHERECTOMY Right 06/07/2014   Procedure: LAPAROSCOPIC RIGHT SALPINGO OOPHORECTOMY/COLLECTION OF PELVIC WASHINGS;  Surgeon: Megan Salon, MD;  Location: [facility];  Service: Gynecology;  Laterality: Right;  . LAPAROSCOPIC UNILATERAL SALPINGECTOMY Left 06/07/2014   Procedure: LAPAROSCOPIC UNILATERAL SALPINGECTOMY;  Surgeon: Megan Salon, MD;  Location: [facility];  Service: Gynecology;  Laterality: Left;  . ORIF ANKLE FRACTURE Right 06/07/2013   Procedure: OPEN REDUCTION INTERNAL FIXATION (ORIF) ANKLE FRACTURE  FIBULA SYNDESMOSIS;  Surgeon: Ninetta Lights, MD;  Location: [place];  Service: Orthopedics;  Laterality: Right;  .  RE-EXCISION OF BREAST CANCER,SUPERIOR MARGINS Right 06/01/2020   Procedure: RIGHT BREAST MARGIN REEXCISION;  Surgeon: Jovita Kussmaul, MD;  Location: [place];  Service: General;  Laterality: Right;    There were no vitals filed for this visit.   Subjective Assessment - 06/20/20 1102    Subjective Pt. is here for follow up after Right breast lumpectomy with SLNB on 04/20/2020 with reexcision on 06/01/2020. Have not done the ABC class yet.She had 2 LN removed.  It went well.  It doesn't look as nice as the first time. Shoulder motion  feels really good.  Breast still feels a little hard.  Did buy some sports bra's and is wearing them consistently    Pertinent History right breast cancer diagnosed with bio Pt had reexcision on June 01, 2020.  biopsy on 02/29/20 showed invasive ductal carcinoma with DCIS, grade 1, HER-2 negative (1+), ER/PR >95%, Ki 67 5%. Surgery is scheduled for Jan 20 and she will have radiation.  Past history includes, DM, sleep apnea, bilaterl carpal tunnel surgery( 2006) and right trigger finger surgery ( 2011)    Currently in Pain? No/denies    Pain Score 0-No pain              [facility] PT Assessment - 06/20/20 0001      Assessment   Medical Diagnosis right breast cancer    Referring Provider (PT) Dr. [name]    Onset Date/Surgical Date 02/29/20      Precautions   Precaution Comments lymphedema risk      Observation/Other Assessments-Edema    Edema --   mild edema/fibrosis noted at areolar incision at lateral right breast     Posture/Postural Control   Posture/Postural Control Postural limitations    Postural Limitations Rounded Shoulders;Forward head      AROM   Right Shoulder Extension 60 Degrees    Right Shoulder Flexion 160 Degrees    Right Shoulder ABduction 180 Degrees      Strength   Overall Strength Within functional limits for tasks performed             LYMPHEDEMA/ONCOLOGY QUESTIONNAIRE - 06/20/20 0001      Type   Cancer Type right breast      Surgeries   Lumpectomy Date 04/20/20    Sentinel Lymph Node Biopsy Date 04/20/20    Number Lymph Nodes Removed 2      Treatment   Active Chemotherapy Treatment No    Past Chemotherapy Treatment No    Active Radiation Treatment --   will have simulation 07/06/2020   Past Radiation Treatment No    Current Hormone Treatment No   but will do   Past Hormone Therapy No      What other symptoms do you have   Is there Decreased scar mobility Yes      Lymphedema Assessments   Lymphedema Assessments --   no visible swelling of UE                      [facility] Adult PT Treatment/Exercise - 06/20/20 0001      Manual Therapy   Edema Management Pt advised to place chip pack in sports bra over areolar incision area of swelling/fibrosis    Soft tissue mobilization Pt was educated in scar massage to incisions.  therapist also performed scar massage    Manual Lymphatic Drainage (MLD) pt educated in MLD to right breast including activation of LN's, 5 breaths, pathways and right breast work.  PT Education - 06/20/20 1146    Education Details Pt was educated in scar massage to right breast incisions, and MLD to right breast with emphasis on outer breast tissue toward axillo- inquinal pathway.    Person(s) Educated Patient    Methods Explanation    Comprehension Verbalized understanding;Returned demonstration               PT Long Term Goals - 06/20/20 1201      PT LONG TERM GOAL #1   Title Pt will be independent in self MLD and scar massage to right breast    Time 4    Period Weeks    Status New      PT LONG TERM GOAL #2   Title Pt will have decreased fibrosis/swelling at right breast    Time 6    Period Weeks    Status New    Target Date 08/01/20      PT LONG TERM GOAL #3   Title Pt will be fit for compression sleeve as indicated    Time 6    Period Weeks    Status New           Breast Clinic Goals - 06/20/20 1201      Patient will be able to verbalize understanding of pertinent lymphedema risk reduction practices relevant to her diagnosis specifically related to skin care.   Status Achieved      Patient will be able to return demonstrate and/or verbalize understanding of the post-op home exercise program related to regaining shoulder range of motion.   Status Achieved      Patient will be able to verbalize understanding of the importance of attending the postoperative After Breast Cancer Class for further lymphedema risk reduction education and therapeutic exercise.    Time 2    Period Days    Status Achieved                Plan - 06/20/20 1154    Clinical Impression Statement Pt returns today after right breast Reexcision on 06/01/2020.  Her shoulder ROM is excellent.  She has mild fibrosis/swelling at areolar incision.  She was instructed today in scar massage and Right breast Self MLD focusing mainly on the right axillo inguinal pathway and right lateral breast area of fibrosis.  She will use her chip pack over the incision and with her sports bra to soften the area.  She did an excellent job with techniques of Self MLD.We will see her back in 2 weeks or sooner if she feels she needs to.  ABC class was set up today.    Personal Factors and Comorbidities Comorbidity 2    Comorbidities DM, previous hand surgery    Stability/Clinical Decision Making Stable/Uncomplicated    Rehab Potential Excellent    PT Frequency 1x / week   every week or 2 for up to 6 weeks or 6 visits   PT Duration 6 weeks    PT Treatment/Interventions Therapeutic exercise;ADLs/Self Care Home Management;Patient/family education;Manual techniques;Passive range of motion;Manual lymph drainage    PT Next Visit Plan check areolar incision area for swelling, review scar massage MLD prn, benefit of chip pack, compression sleeve?    PT Home Exercise Plan MLD to right breast/scar massage    Recommended Other Services sleeve    Consulted and Agree with Plan of Care Patient           Patient will benefit from skilled therapeutic intervention in order to improve the following  deficits and impairments:  Decreased knowledge of precautions,Decreased scar mobility,Postural dysfunction,Increased edema  Visit Diagnosis: Abnormal posture  Aftercare following surgery for neoplasm  Malignant neoplasm of upper-outer quadrant of right female breast, unspecified estrogen receptor status ([place])  Edema, unspecified type     Problem List Patient Active Problem List   Diagnosis Date Noted  .  Genetic testing 04/17/2020  . Family history of breast cancer 04/11/2020  . Family history of prostate cancer 04/11/2020  . Family history of thyroid cancer 04/11/2020  . Malignant neoplasm of upper-outer quadrant of right breast in female, estrogen receptor positive ([place]) 03/21/2020  . Abnormal mammogram of left breast 09/26/2017  . Bilateral ovarian cysts 05/23/2014  . Polycystic ovary disease   . Fracture of distal fibula 06/04/2013  . HTN (hypertension) 04/07/2013  . PCOS (polycystic ovarian syndrome) 04/07/2013  . Diabetes ([place]) 04/07/2013  . Renal calculi 04/07/2013  . Adjustment disorder with mixed anxiety and depressed mood 04/07/2013    Claris Pong 06/20/2020, 12:10 PM  [place], [place], [address] Phone: [phone]   Fax:  [phone]  Name: Essynce Munsch MRN: [id number] Date of Birth: December 31, 1965 Cheral Almas, PT 06/20/20 12:13 PM

## 2020-06-20 NOTE — Patient Instructions (Signed)
Scar massage: across incision, circles, diagonally across 3-4 minutes 2 x/day  Manual Lymph Drainage for Right Breast.  Do daily.  Do slowly. Use flat hands with just enough pressure to stretch the skin. Do not slide over the skin, but move the skin with the hand you're using. Lie down or sit comfortably (in a recliner, for example) to do this.  1) Hug yourself:  cross arms and do circles at collar bones near neck 5-7 times (to "wake up" lots of lymph nodes in this area). 2) Take slow deep breaths, allowing your belly to balloon out as your breathe in, 5x (to "wake up" abdominal lymph nodes to take on extra fluid). 3) Left armpit--stretch skin in small circles to stimulate intact lymph nodes there, 5-7x. 4) Right groin area, at panty line--stretch skin in small circles to stimulate lymph nodes 5-7x. 5) Redirect fluid from right chest toward left armpit (stretch skin starting at right chest in 3-4 spots working toward left armpit) 3-4x across the chest. 6) Redirect fluid from right armpit toward right groin (cup your hand around the curve of your right side and do 3-4 "pumps" from armpit to groin) 3-4x down your side. 7) Draw an imaginary diagonal line from upper outer breast through the nipple area toward lower inner breast.  Direct fluid upward and inward from this line toward the pathway across your upper chest (established in #5).  Do this in three rows to treat all of the upper inner breast tissue, and do each row 3-4x. 8) Then repeat #5 above. 9) From the imaginary diagonal, direct fluid in three rows (to treat all of lower outer breast tissue) downward and outward toward pathway established in #6 that is aimed at the right groin. 10)  Then repeat #6 above. 11)  End with repeating #3 and #4 above.   [facility] [address] [place]   [address] [phone]

## 2020-07-04 ENCOUNTER — Encounter: Payer: Self-pay | Admitting: Radiation Oncology

## 2020-07-04 ENCOUNTER — Ambulatory Visit
Admission: RE | Admit: 2020-07-04 | Discharge: 2020-07-04 | Disposition: A | Discharge status: Home / Self Care | Payer: 59 | Source: Ambulatory Visit | Attending: Radiation Oncology | Admitting: Radiation Oncology

## 2020-07-04 ENCOUNTER — Other Ambulatory Visit: Payer: Self-pay

## 2020-07-04 ENCOUNTER — Ambulatory Visit: Payer: 59

## 2020-07-04 VITALS — BP 102/67 | HR 73 | Temp 97.7°F | Resp 20 | Ht 61.0 in | Wt 191.4 lb

## 2020-07-04 DIAGNOSIS — Z87442 Personal history of urinary calculi: Secondary | ICD-10-CM | POA: Insufficient documentation

## 2020-07-04 DIAGNOSIS — Z170 Estrogen receptor positive status [ER+]: Secondary | ICD-10-CM | POA: Diagnosis not present

## 2020-07-04 DIAGNOSIS — Z801 Family history of malignant neoplasm of trachea, bronchus and lung: Secondary | ICD-10-CM | POA: Insufficient documentation

## 2020-07-04 DIAGNOSIS — Z483 Aftercare following surgery for neoplasm: Secondary | ICD-10-CM

## 2020-07-04 DIAGNOSIS — E119 Type 2 diabetes mellitus without complications: Secondary | ICD-10-CM | POA: Insufficient documentation

## 2020-07-04 DIAGNOSIS — E282 Polycystic ovarian syndrome: Secondary | ICD-10-CM | POA: Insufficient documentation

## 2020-07-04 DIAGNOSIS — R293 Abnormal posture: Secondary | ICD-10-CM | POA: Insufficient documentation

## 2020-07-04 DIAGNOSIS — J45909 Unspecified asthma, uncomplicated: Secondary | ICD-10-CM | POA: Insufficient documentation

## 2020-07-04 DIAGNOSIS — Z803 Family history of malignant neoplasm of breast: Secondary | ICD-10-CM | POA: Insufficient documentation

## 2020-07-04 DIAGNOSIS — G4730 Sleep apnea, unspecified: Secondary | ICD-10-CM | POA: Diagnosis not present

## 2020-07-04 DIAGNOSIS — Z8041 Family history of malignant neoplasm of ovary: Secondary | ICD-10-CM | POA: Insufficient documentation

## 2020-07-04 DIAGNOSIS — C50411 Malignant neoplasm of upper-outer quadrant of right female breast: Secondary | ICD-10-CM | POA: Insufficient documentation

## 2020-07-04 DIAGNOSIS — Z79899 Other long term (current) drug therapy: Secondary | ICD-10-CM | POA: Insufficient documentation

## 2020-07-04 DIAGNOSIS — E7800 Pure hypercholesterolemia, unspecified: Secondary | ICD-10-CM | POA: Insufficient documentation

## 2020-07-04 DIAGNOSIS — Z8042 Family history of malignant neoplasm of prostate: Secondary | ICD-10-CM | POA: Diagnosis not present

## 2020-07-04 DIAGNOSIS — Z7984 Long term (current) use of oral hypoglycemic drugs: Secondary | ICD-10-CM | POA: Diagnosis not present

## 2020-07-04 DIAGNOSIS — R609 Edema, unspecified: Secondary | ICD-10-CM

## 2020-07-04 DIAGNOSIS — I10 Essential (primary) hypertension: Secondary | ICD-10-CM | POA: Insufficient documentation

## 2020-07-04 DIAGNOSIS — Z510 Encounter for antineoplastic radiation therapy: Secondary | ICD-10-CM | POA: Diagnosis not present

## 2020-07-04 HISTORY — DX: Malignant neoplasm of unspecified site of unspecified female breast: C50.919

## 2020-07-04 NOTE — Addendum Note (Signed)
Encounter addended by: Cori Razor, RN on: 07/04/2020 1:16 PM  Actions taken: Flowsheet accepted

## 2020-07-04 NOTE — Patient Instructions (Signed)

## 2020-07-04 NOTE — Progress Notes (Signed)
Radiation Oncology         [phone]) [phone] ________________________________  Name: Nicole Khan        MRN: [id number]  Date of Service: 07/04/2020 DOB: 1965-04-22  SW:FUXNAT, Lattie Haw, MD  Nicholas Lose, MD     REFERRING PHYSICIAN: Nicholas Lose, MD   DIAGNOSIS: The encounter diagnosis was Malignant neoplasm of upper-outer quadrant of right breast in female, estrogen receptor positive ([place]).   HISTORY OF PRESENT ILLNESS: Nicole Khan is a 55 y.o. female with a diagnosis of right breast cancer. The patient was noted to have a screening detected mass in the right breast. This was noted with further diagnostic work-up at the 10 o'clock position, and by ultrasound measured 6 mm in greatest dimension with associated architectural distortion, multiple cysts were seen in the inner right breast, and the axilla was negative for adenopathy.  She underwent a biopsy on 02/29/2020 which revealed a grade 1 invasive ductal carcinoma.  Her tumor was ER/PR positive, HER-2 was negative, and Ki-67 was 5%.  Additional core biopsy at the 10 o'clock position on 03/09/2020 revealed columnar cell hyperplasia with calcifications.    Since her last visit she underwent a right lumpectomy on 04/20/20 that showed a 9 mm invasive ductal carcinoma grade 1 with associated intermediate grade DCIS. There was concern for DCIS at the posterior margin and less than 1 mm from the anterior margin. Two sampled nodes were negative for carcinoma, and her right superior and lateral margins also showed atypical ductal hyperplasia. She returned for re-excision of her margins on 06/01/20 and her anterior, medial, and lateral margins were all clear of carcinoma and showed fibrocystic changes. She does not need systemic chemotherapy and is ready to now proceed with adjuvant radiotherapy. She's seen today to discuss this.     PREVIOUS RADIATION THERAPY: No   PAST MEDICAL HISTORY:  Past Medical History:  Diagnosis Date  . Abnormal  mammogram of left breast 09/26/2017  . Abnormal Pap smear of cervix   . Anxiety   . Asthma    exercise induced asthma  . Breast cancer ([place])   . Cancer ([place])    Breast (right)  . Depression   . Diabetes mellitus without complication ([place])    type 2  . Family history of breast cancer 04/11/2020  . Family history of prostate cancer 04/11/2020  . Family history of thyroid cancer 04/11/2020  . Fracture dislocation of right ankle joint 06-04-2013  . Fracture of distal fibula 06-04-2013  . H/O bilateral salpingectomy 05/2015  . History of kidney stones    passed stones, no surgery required  . Hypercholesteremia   . Hypertension   . Polycystic ovary disease   . PONV (postoperative nausea and vomiting)   . Seasonal allergies   . Sleep apnea 05/2017   uses cpap  . SVD (spontaneous vaginal delivery)    x 1       PAST SURGICAL HISTORY: Past Surgical History:  Procedure Laterality Date  . BREAST BIOPSY Right 02/29/2020  . BREAST EXCISIONAL BIOPSY Left 2019   Benign  . BREAST LUMPECTOMY WITH RADIOACTIVE SEED AND SENTINEL LYMPH NODE BIOPSY Right 04/20/2020   Procedure: RIGHT BREAST LUMPECTOMY WITH RADIOACTIVE SEED AND SENTINEL LYMPH NODE BIOPSY;  Surgeon: Jovita Kussmaul, MD;  Location: [facility];  Service: General;  Laterality: Right;  . BREAST LUMPECTOMY WITH RADIOACTIVE SEED LOCALIZATION Left 09/26/2017   Procedure: LEFT BREAST LUMPECTOMY WITH RADIOACTIVE SEED LOCALIZATION ERAS PATHWAY;  Surgeon: Fanny Skates, MD;  Location: [facility];  Service: General;  Laterality: Left;  . CARPAL TUNNEL RELEASE     bilateral  . CERVICAL CERCLAGE  07/1991; 05/1999   x 2  . CESAREAN SECTION     x 1  . CHOLECYSTECTOMY, LAPAROSCOPIC  1996  . COLONOSCOPY WITH PROPOFOL N/A 06/24/2017   Procedure: COLONOSCOPY WITH PROPOFOL;  Surgeon: Juanita Craver, MD;  Location: [facility];  Service: Endoscopy;  Laterality: N/A;  . CYSTOSCOPY N/A 06/07/2014   Procedure: CYSTOSCOPY;  Surgeon: Megan Salon, MD;   Location: [facility];  Service: Gynecology;  Laterality: N/A;  . LAPAROSCOPIC BILATERAL SALPINGO OOPHERECTOMY Right 06/07/2014   Procedure: LAPAROSCOPIC RIGHT SALPINGO OOPHORECTOMY/COLLECTION OF PELVIC WASHINGS;  Surgeon: Megan Salon, MD;  Location: [facility];  Service: Gynecology;  Laterality: Right;  . LAPAROSCOPIC UNILATERAL SALPINGECTOMY Left 06/07/2014   Procedure: LAPAROSCOPIC UNILATERAL SALPINGECTOMY;  Surgeon: Megan Salon, MD;  Location: [facility];  Service: Gynecology;  Laterality: Left;  . ORIF ANKLE FRACTURE Right 06/07/2013   Procedure: OPEN REDUCTION INTERNAL FIXATION (ORIF) ANKLE FRACTURE  FIBULA SYNDESMOSIS;  Surgeon: Ninetta Lights, MD;  Location: [facility];  Service: Orthopedics;  Laterality: Right;  . RE-EXCISION OF BREAST CANCER,SUPERIOR MARGINS Right 06/01/2020   Procedure: RIGHT BREAST MARGIN REEXCISION;  Surgeon: Jovita Kussmaul, MD;  Location: [facility];  Service: General;  Laterality: Right;     FAMILY HISTORY:  Family History  Problem Relation Age of Onset  . Hypertension Mother   . Infertility Mother   . Deep vein thrombosis Mother   . Anxiety disorder Mother   . Depression Mother   . Hepatitis C Mother        blood transfusion  . Aneurysm Mother        [place]  . Cancer Father        Gallbladder; dx 56s  . Hypertension Father   . Anxiety disorder Father   . Depression Father   . Multiple births Sister   . Thyroid disease Sister   . Anxiety disorder Sister   . Depression Sister   . Breast cancer Sister 11  . Thyroid cancer Sister 92       unknown type  . Hypertension Brother   . Anxiety disorder Brother   . Depression Brother   . Prostate cancer Paternal Uncle        dx 35s  . Lung cancer Half-Sister 56       maternal half sister; no smoking hx     SOCIAL HISTORY:  reports that she has never smoked. She has never used smokeless tobacco. She reports that she does not drink alcohol and does not use drugs.  The patient is married.  She lives in  [place], she has a 6 year old child.  She is a Agricultural engineer.   ALLERGIES: Soy allergy, Celery oil, Eggs or egg-derived products, Hazel tree pollen [corylus], and Tape   MEDICATIONS:  Current Outpatient Medications  Medication Sig Dispense Refill  . acetaminophen (TYLENOL) 500 MG tablet Take 1,000 mg by mouth every 6 (six) hours as needed for headache.    . albuterol (PROVENTIL HFA;VENTOLIN HFA) 108 (90 Base) MCG/ACT inhaler Inhale 1 puff into the lungs every 6 (six) hours as needed for wheezing or shortness of breath.    Marland Kitchen atorvastatin (LIPITOR) 40 MG tablet Take 40 mg by mouth at bedtime.   1  . atorvastatin (LIPITOR) 40 MG tablet TAKE 1 TABLET BY MOUTH EVERY NIGHT AT BEDTIME 90 tablet 3  . azelastine (ASTELIN) 0.1 %  nasal spray Place 1 spray into both nostrils every evening. Use in each nostril as directed    . B Complex-C (B-COMPLEX WITH VITAMIN C) tablet Take 1 tablet by mouth every evening.    Marland Kitchen buPROPion (WELLBUTRIN XL) 300 MG 24 hr tablet Take 300 mg by mouth daily.    Marland Kitchen buPROPion (WELLBUTRIN XL) 300 MG 24 hr tablet TAKE 1 TABLET BY MOUTH DAILY EVERY MORNING 90 tablet 0  . citalopram (CELEXA) 20 MG tablet Take 20 mg by mouth daily.    . citalopram (CELEXA) 20 MG tablet TAKE 1 TABLET BY MOUTH ONCE DAILY. 90 tablet 3  . citalopram (CELEXA) 20 MG tablet TAKE 1 TABLET BY MOUTH ONCE A DAY 90 tablet 0  . Dulaglutide 1.5 MG/0.5ML SOPN INJECT 1.5 MG UNDER THE SKIN ONCE A WEEK 2 mL 11  . Dulaglutide 3 MG/0.5ML SOPN INJECT 3MG'S UNDER THE SKIN ONCE A WEEK 6 mL 4  . empagliflozin (JARDIANCE) 10 MG TABS tablet TAKE 1 TABLET BY MOUTH ONCE A DAY 90 tablet 4  . empagliflozin (JARDIANCE) 10 MG TABS tablet TAKE 1 TABLET BY MOUTH ONCE DAILY 30 tablet 11  . EPINEPHrine 0.3 mg/0.3 mL IJ SOAJ injection Inject 0.3 mg into the muscle as needed for anaphylaxis.    Marland Kitchen HYDROcodone-acetaminophen (NORCO/VICODIN) 5-325 MG tablet TAKE 1 TO 2 TABLETS BY MOUTH EVERY 6 HOURS AS NEEDED FOR MODERATE OR SEVERE PAIN. 10  tablet 0  . HYDROcodone-acetaminophen (NORCO/VICODIN) 5-325 MG tablet TAKE 1-2 TABLETS BY MOUTH EVERY 6 (SIX) HOURS AS NEEDED FOR MODERATE PAIN OR SEVERE PAIN. 15 tablet 0  . JARDIANCE 10 MG TABS tablet Take 10 mg by mouth daily.    Marland Kitchen levocetirizine (XYZAL) 5 MG tablet Take 5 mg by mouth every evening.    . metFORMIN (GLUCOPHAGE-XR) 500 MG 24 hr tablet Take 2,000 mg by mouth daily.    . metFORMIN (GLUCOPHAGE-XR) 500 MG 24 hr tablet TAKE 4 TABLETS BY MOUTH ONCE DAILY WITH MEALS. 120 tablet 11  . metoprolol (LOPRESSOR) 100 MG tablet Take 100 mg by mouth 2 (two) times daily.    . metoprolol tartrate (LOPRESSOR) 100 MG tablet TAKE 1 TABLET BY MOUTH TWICE DAILY. 180 tablet 3  . metoprolol tartrate (LOPRESSOR) 100 MG tablet TAKE 1 TABLET BY MOUTH TWICE A DAY 180 tablet 0  . phenylephrine (SUDAFED PE) 10 MG TABS tablet Take 10 mg by mouth every 6 (six) hours as needed (for congestion).    Marland Kitchen spironolactone (ALDACTONE) 100 MG tablet TAKE 1 TABLET (100 MG TOTAL) BY MOUTH 2 (TWO) TIMES DAILY. 180 tablet 3  . TRULICITY 3 HX/5.0VW SOPN Inject 3 mg into the skin every Friday.    Marland Kitchen UNABLE TO FIND Immunotherapy Allergy Shot. Injection to each arm once weekly on various day     No current facility-administered medications for this encounter.     REVIEW OF SYSTEMS: On review of systems, the patient reports that she is doing well overall since our last visit. She feels like her breast is healing well without any specific concerns. No other complaints are verbalized.    PHYSICAL EXAM:  Wt Readings from Last 3 Encounters:  07/04/20 191 lb 6.4 oz (86.8 kg)  06/01/20 197 lb (89.4 kg)  04/27/20 194 lb 12.8 oz (88.4 kg)   Temp Readings from Last 3 Encounters:  07/04/20 97.7 F (36.5 C)  06/01/20 97.8 F (36.6 C)  04/27/20 (!) 97.5 F (36.4 C) (Temporal)   BP Readings from Last 3 Encounters:  07/04/20  102/67  06/01/20 97/64  04/27/20 110/64   Pulse Readings from Last 3 Encounters:  07/04/20 73   06/01/20 89  04/27/20 81    In general this is a well appearing caucasian female in no acute distress. She's alert and oriented x4 and appropriate throughout the examination. Cardiopulmonary assessment is negative for acute distress and she exhibits normal effort. Bilateral breast exam is deferred.    ECOG = 0  0 - Asymptomatic (Fully active, able to carry on all predisease activities without restriction)  1 - Symptomatic but completely ambulatory (Restricted in physically strenuous activity but ambulatory and able to carry out work of a light or sedentary nature. For example, light housework, office work)  2 - Symptomatic, <50% in bed during the day (Ambulatory and capable of all self care but unable to carry out any work activities. Up and about more than 50% of waking hours)  3 - Symptomatic, >50% in bed, but not bedbound (Capable of only limited self-care, confined to bed or chair 50% or more of waking hours)  4 - Bedbound (Completely disabled. Cannot carry on any self-care. Totally confined to bed or chair)  5 - Death   Eustace Pen MM, Creech RH, Tormey DC, et al. [phone]). "Toxicity and response criteria of the [facility]". [place] Oncol. 5 (6): 649-55    LABORATORY DATA:  Lab Results  Component Value Date   WBC 7.8 04/17/2020   HGB 16.7 (H) 04/17/2020   HCT 52.0 (H) 04/17/2020   MCV 96.7 04/17/2020   PLT 307 04/17/2020   Lab Results  Component Value Date   NA 136 06/01/2020   K 3.7 06/01/2020   CL 103 06/01/2020   CO2 20 (L) 06/01/2020   Lab Results  Component Value Date   ALT 60 (H) 09/24/2017   AST 38 09/24/2017   ALKPHOS 71 09/24/2017   BILITOT 1.2 09/24/2017      RADIOGRAPHY: No results found.     IMPRESSION/PLAN: 1. Stage IA, [id number] grade 1, ER/PR positive invasive ductal carcinoma of the right breast. Dr. Lisbeth Renshaw discusses the final pathology findings and reviews the nature of right breast disease. She does not need systemic  therapy and is ready to proceed with external radiotherapy to the breast  to reduce risks of local recurrence followed by antiestrogen therapy. We discussed the risks, benefits, short, and long term effects of radiotherapy, as well as the curative intent, and the patient is interested in proceeding. Dr. Lisbeth Renshaw discusses the delivery and logistics of radiotherapy and recomends 4 weeks of radiotherapy. Written consent is obtained and placed in the chart, a copy was provided to the patient. She will simulate on Thursday.  In a visit lasting 45 minutes, greater than 50% of the time was spent face to face (I was remotely at home via Webex while Dr. Lisbeth Renshaw was in the clinic) discussing the patient's condition, in preparation for the discussion, and coordinating the patient's care.   The above documentation reflects my direct findings during this shared patient visit. Please see the separate note by Dr. Lisbeth Renshaw on this date for the remainder of the patient's plan of care.    Carola Rhine, PAC

## 2020-07-04 NOTE — Therapy (Signed)
[place], [place], [address] Phone: [phone]   Fax:  [phone]  Physical Therapy Treatment  Patient Details  Name: Nicole Khan MRN: [id number] Date of Birth: [birth year] Referring Provider (PT): Dr. [name]   Encounter Date: 07/04/2020   PT End of Session - 07/04/20 1409    Visit Number 4    Number of Visits 6    Date for PT Re-Evaluation 08/01/20    PT Start Time 0105    PT Stop Time 0203    PT Time Calculation (min) 58 min    Activity Tolerance Patient tolerated treatment well    Behavior During Therapy [facility] for tasks assessed/performed           Past Medical History:  Diagnosis Date  . Abnormal mammogram of left breast 09/26/2017  . Abnormal Pap smear of cervix   . Anxiety   . Asthma    exercise induced asthma  . Breast cancer ([place])   . Cancer ([place])    Breast (right)  . Depression   . Diabetes mellitus without complication ([place])    type 2  . Family history of breast cancer 04/11/2020  . Family history of prostate cancer 04/11/2020  . Family history of thyroid cancer 04/11/2020  . Fracture dislocation of right ankle joint 06-04-2013  . Fracture of distal fibula 06-04-2013  . H/O bilateral salpingectomy 05/2015  . History of kidney stones    passed stones, no surgery required  . Hypercholesteremia   . Hypertension   . Polycystic ovary disease   . PONV (postoperative nausea and vomiting)   . Seasonal allergies   . Sleep apnea 05/2017   uses cpap  . SVD (spontaneous vaginal delivery)    x 1    Past Surgical History:  Procedure Laterality Date  . BREAST BIOPSY Right 02/29/2020  . BREAST EXCISIONAL BIOPSY Left 2019   Benign  . BREAST LUMPECTOMY WITH RADIOACTIVE SEED AND SENTINEL LYMPH NODE BIOPSY Right 04/20/2020   Procedure: RIGHT BREAST LUMPECTOMY WITH RADIOACTIVE SEED AND SENTINEL LYMPH NODE BIOPSY;  Surgeon: Jovita Kussmaul, MD;  Location: [facility];  Service: General;   Laterality: Right;  . BREAST LUMPECTOMY WITH RADIOACTIVE SEED LOCALIZATION Left 09/26/2017   Procedure: LEFT BREAST LUMPECTOMY WITH RADIOACTIVE SEED LOCALIZATION ERAS PATHWAY;  Surgeon: Fanny Skates, MD;  Location: [facility];  Service: General;  Laterality: Left;  . CARPAL TUNNEL RELEASE     bilateral  . CERVICAL CERCLAGE  07/1991; 05/1999   x 2  . CESAREAN SECTION     x 1  . CHOLECYSTECTOMY, LAPAROSCOPIC  1996  . COLONOSCOPY WITH PROPOFOL N/A 06/24/2017   Procedure: COLONOSCOPY WITH PROPOFOL;  Surgeon: Juanita Craver, MD;  [facility];  Service: Endoscopy;  Laterality: N/A;  . CYSTOSCOPY N/A 06/07/2014   Procedure: CYSTOSCOPY;  Surgeon: Megan Salon, MD;  Location: [facility];  Service: Gynecology;  Laterality: N/A;  . LAPAROSCOPIC BILATERAL SALPINGO OOPHERECTOMY Right 06/07/2014   Procedure: LAPAROSCOPIC RIGHT SALPINGO OOPHORECTOMY/COLLECTION OF PELVIC WASHINGS;  Surgeon: Megan Salon, MD;  Location: [facility];  Service: Gynecology;  Laterality: Right;  . LAPAROSCOPIC UNILATERAL SALPINGECTOMY Left 06/07/2014   Procedure: LAPAROSCOPIC UNILATERAL SALPINGECTOMY;  Surgeon: Megan Salon, MD;  Location: [facility];  Service: Gynecology;  Laterality: Left;  . ORIF ANKLE FRACTURE Right 06/07/2013   Procedure: OPEN REDUCTION INTERNAL FIXATION (ORIF) ANKLE FRACTURE  FIBULA SYNDESMOSIS;  Surgeon: Ninetta Lights, MD;  Location: [facility];  Service:  Orthopedics;  Laterality: Right;  . RE-EXCISION OF BREAST CANCER,SUPERIOR MARGINS Right 06/01/2020   Procedure: RIGHT BREAST MARGIN REEXCISION;  Surgeon: Jovita Kussmaul, MD;  Location: [facility];  Service: General;  Laterality: Right;    There were no vitals filed for this visit.   Subjective Assessment - 07/04/20 1305    Subjective Firmness at breast is much better.  Saw the surgeon on Friday and he doesn't think I need to wear the chip pack anymore.  I am not wearing the sports bra since yesterday and my breast seems fine. I have done  the MLD at home but I use the sheet to do it. Forgot to do the ABC class yesterday so I need to sign up for it again. Saw Radiation today and it should start on 07/17/20 for 4 weeks.    Pertinent History right breast cancer diagnosed with bio Pt had reexcision on June 01, 2020.  biopsy on 02/29/20 showed invasive ductal carcinoma with DCIS, grade 1, HER-2 negative (1+), ER/PR >95%, Ki 67 5%. Surgery is scheduled for Jan 20 and she will have radiation.  Past history includes, DM, sleep apnea, bilaterl carpal tunnel surgery( 2006) and right trigger finger surgery ( 2011)    Currently in Pain? No/denies    Pain Score 0-No pain                             [facility] Adult PT Treatment/Exercise - 07/04/20 0001      Shoulder Exercises: Supine   Horizontal ABduction Strengthening;Both;10 reps    Theraband Level (Shoulder Horizontal ABduction) Level 1 (Yellow)    External Rotation Strengthening;Both;10 reps    Theraband Level (Shoulder External Rotation) Level 1 (Yellow)    Flexion Strengthening;Both;10 reps    Theraband Level (Shoulder Flexion) Level 1 (Yellow)    Diagonals Strengthening;Right;Left;5 reps    Theraband Level (Shoulder Diagonals) Level 1 (Yellow)      Manual Therapy   Edema Management pt may discontinue chip pack and wear regular bra but was advised to keep a check on fibrosis and if it worsens to start back to chip pack/sports bra    Soft tissue mobilization Pt  reviewed  scar massage to incisions.    Manual Lymphatic Drainage (MLD) Reviewed MLD to right breast including activation of LN's, 5 breaths, pathways and right breast work. Pt was able to return demonstrate with occasional VC's                  PT Education - 07/04/20 1408    Education Details Pt was educated in supine scapular series and was able to return demonstrate.  Reviewed MLD as well and scar massage and could return demonstrated    Person(s) Educated Patient    Methods  Explanation;Demonstration;Handout    Comprehension Returned demonstration               PT Long Term Goals - 06/20/20 1201      PT LONG TERM GOAL #1   Title Pt will be independent in self MLD and scar massage to right breast    Time 4    Period Weeks    Status Achieved     PT LONG TERM GOAL #2   Title Pt will have decreased fibrosis/swelling at right breast    Time 6    Period Weeks    Status achieved   Target Date 08/01/20      PT LONG TERM GOAL #  3   Title Pt will be fit for compression sleeve as indicated    Time 6    Period Weeks    Status ongoing                 Plan - 07/04/20 1409    Clinical Impression Statement Therapy consisted of review of self breast MLD, instruction in supine scapular series with yellow TB, review of scar massage and discussion on compression sleeves and their use.  Pt works for [employer] and has [employer]  She would like me to send her information to liz to check benefits.  She was set up on 07/28/20 for measuring for sleeve. We have placed her on hold at this time secondary to decreased breast fibrosis and swelling and pts  ability to return demonstrate self MLD.  She was advised to call with questions or concerns and to keep watch for increased breast swelling or shoulder tightness as she goes through radiation.    Personal Factors and Comorbidities Comorbidity 2    Comorbidities DM, previous hand surgery    Stability/Clinical Decision Making Stable/Uncomplicated    Rehab Potential Excellent    PT Frequency 1x / week    PT Duration 6 weeks    PT Treatment/Interventions Therapeutic exercise;ADLs/Self Care Home Management;Patient/family education;Manual techniques;Passive range of motion;Manual lymph drainage    PT Next Visit Plan check areolar incision area for swelling, review scar massage MLD prn, benefit of chip pack, check  compression sleeve when she receives    PT Home Exercise Plan MLD to right breast/scar massage, supine scapular series     Recommended Other Services sleeve/compression bra?    Consulted and Agree with Plan of Care Patient           Patient will benefit from skilled therapeutic intervention in order to improve the following deficits and impairments:  Decreased knowledge of precautions,Decreased scar mobility,Postural dysfunction,Increased edema  Visit Diagnosis: Abnormal posture  Aftercare following surgery for neoplasm  Malignant neoplasm of upper-outer quadrant of right female breast, unspecified estrogen receptor status ([place])  Edema, unspecified type     Problem List Patient Active Problem List   Diagnosis Date Noted  . Genetic testing 04/17/2020  . Family history of breast cancer 04/11/2020  . Family history of prostate cancer 04/11/2020  . Family history of thyroid cancer 04/11/2020  . Malignant neoplasm of upper-outer quadrant of right breast in female, estrogen receptor positive ([place]) 03/21/2020  . Abnormal mammogram of left breast 09/26/2017  . Bilateral ovarian cysts 05/23/2014  . Polycystic ovary disease   . Fracture of distal fibula 06/04/2013  . HTN (hypertension) 04/07/2013  . PCOS (polycystic ovarian syndrome) 04/07/2013  . Diabetes ([place]) 04/07/2013  . Renal calculi 04/07/2013  . Adjustment disorder with mixed anxiety and depressed mood 04/07/2013    Claris Pong 07/04/2020, 2:23 PM  [place], [place], [address] Phone: [phone]   Fax:  [phone]  Name: Brady Plant MRN: [id number] Date of Birth: 10/27/1965 Cheral Almas, PT 07/04/20 2:24 PM

## 2020-07-04 NOTE — Progress Notes (Signed)
New Breast Cancer Diagnosis: Right Breast Cancer   Histology per Pathology Report:  06/01/2020- Re-excision of Margins   04/20/2020 Right Lumpectomy    Receptor Status: ER(positive), PR (positive), Her2-neu (negative), Ki-(5%)  Surgeon and surgical plan, if any: Dr. [name] -Right Breast Lumpectomy with radioactive seed Localization 04/20/2020 -Right Breast Margin Re-excision 06/01/2020  Medical oncologist, treatment if any:    Family History of Breast/Ovarian/Prostate Cancer: Sister with breast cancer, Paternal Uncle with prostate cancer.  Lymphedema issues, if any: No  Pain issues, if any:  No  SAFETY ISSUES: Prior radiation? No Pacemaker/ICD? No Possible current pregnancy?Postmenopausal Is the patient on methotrexate? No  Current Complaints / other details:

## 2020-07-05 ENCOUNTER — Other Ambulatory Visit (HOSPITAL_COMMUNITY): Payer: Self-pay

## 2020-07-05 DIAGNOSIS — J3081 Allergic rhinitis due to animal (cat) (dog) hair and dander: Secondary | ICD-10-CM | POA: Diagnosis not present

## 2020-07-05 DIAGNOSIS — J301 Allergic rhinitis due to pollen: Secondary | ICD-10-CM | POA: Diagnosis not present

## 2020-07-05 DIAGNOSIS — J3089 Other allergic rhinitis: Secondary | ICD-10-CM | POA: Diagnosis not present

## 2020-07-05 MED FILL — Empagliflozin Tab 10 MG: ORAL | 90 days supply | Qty: 90 | Fill #0 | Status: AC

## 2020-07-05 MED FILL — Atorvastatin Calcium Tab 40 MG (Base Equivalent): ORAL | 90 days supply | Qty: 90 | Fill #0 | Status: AC

## 2020-07-06 ENCOUNTER — Other Ambulatory Visit: Payer: Self-pay

## 2020-07-06 ENCOUNTER — Ambulatory Visit
Admission: RE | Admit: 2020-07-06 | Discharge: 2020-07-06 | Disposition: A | Discharge status: Home / Self Care | Payer: 59 | Source: Ambulatory Visit | Attending: Radiation Oncology | Admitting: Radiation Oncology

## 2020-07-06 DIAGNOSIS — Z510 Encounter for antineoplastic radiation therapy: Secondary | ICD-10-CM | POA: Diagnosis not present

## 2020-07-06 DIAGNOSIS — Z170 Estrogen receptor positive status [ER+]: Secondary | ICD-10-CM | POA: Diagnosis not present

## 2020-07-06 DIAGNOSIS — C50411 Malignant neoplasm of upper-outer quadrant of right female breast: Secondary | ICD-10-CM | POA: Diagnosis not present

## 2020-07-12 ENCOUNTER — Ambulatory Visit: Payer: 59 | Admitting: Obstetrics and Gynecology

## 2020-07-12 ENCOUNTER — Encounter: Payer: Self-pay | Admitting: *Deleted

## 2020-07-13 ENCOUNTER — Telehealth: Payer: Self-pay | Admitting: Hematology and Oncology

## 2020-07-13 NOTE — Telephone Encounter (Signed)
Scheduled per 04/14 scheduled message, patient has been called and notified.

## 2020-07-14 DIAGNOSIS — Z170 Estrogen receptor positive status [ER+]: Secondary | ICD-10-CM | POA: Diagnosis not present

## 2020-07-14 DIAGNOSIS — Z510 Encounter for antineoplastic radiation therapy: Secondary | ICD-10-CM | POA: Diagnosis not present

## 2020-07-14 DIAGNOSIS — C50411 Malignant neoplasm of upper-outer quadrant of right female breast: Secondary | ICD-10-CM | POA: Diagnosis not present

## 2020-07-17 ENCOUNTER — Ambulatory Visit
Admission: RE | Admit: 2020-07-17 | Discharge: 2020-07-17 | Disposition: A | Discharge status: Home / Self Care | Payer: 59 | Source: Ambulatory Visit | Attending: Radiation Oncology | Admitting: Radiation Oncology

## 2020-07-17 ENCOUNTER — Other Ambulatory Visit: Payer: Self-pay

## 2020-07-17 DIAGNOSIS — Z170 Estrogen receptor positive status [ER+]: Secondary | ICD-10-CM | POA: Diagnosis not present

## 2020-07-17 DIAGNOSIS — Z510 Encounter for antineoplastic radiation therapy: Secondary | ICD-10-CM | POA: Diagnosis not present

## 2020-07-17 DIAGNOSIS — C50411 Malignant neoplasm of upper-outer quadrant of right female breast: Secondary | ICD-10-CM | POA: Diagnosis not present

## 2020-07-18 ENCOUNTER — Ambulatory Visit
Admission: RE | Admit: 2020-07-18 | Discharge: 2020-07-18 | Disposition: A | Discharge status: Home / Self Care | Payer: 59 | Source: Ambulatory Visit | Attending: Radiation Oncology | Admitting: Radiation Oncology

## 2020-07-18 DIAGNOSIS — Z170 Estrogen receptor positive status [ER+]: Secondary | ICD-10-CM | POA: Diagnosis not present

## 2020-07-18 DIAGNOSIS — Z510 Encounter for antineoplastic radiation therapy: Secondary | ICD-10-CM | POA: Diagnosis not present

## 2020-07-18 DIAGNOSIS — C50411 Malignant neoplasm of upper-outer quadrant of right female breast: Secondary | ICD-10-CM | POA: Diagnosis not present

## 2020-07-19 ENCOUNTER — Ambulatory Visit
Admission: RE | Admit: 2020-07-19 | Discharge: 2020-07-19 | Disposition: A | Discharge status: Home / Self Care | Payer: 59 | Source: Ambulatory Visit | Attending: Radiation Oncology | Admitting: Radiation Oncology

## 2020-07-19 ENCOUNTER — Other Ambulatory Visit: Payer: Self-pay

## 2020-07-19 DIAGNOSIS — C50411 Malignant neoplasm of upper-outer quadrant of right female breast: Secondary | ICD-10-CM | POA: Diagnosis not present

## 2020-07-19 DIAGNOSIS — J301 Allergic rhinitis due to pollen: Secondary | ICD-10-CM | POA: Diagnosis not present

## 2020-07-19 DIAGNOSIS — Z510 Encounter for antineoplastic radiation therapy: Secondary | ICD-10-CM | POA: Diagnosis not present

## 2020-07-19 DIAGNOSIS — J3089 Other allergic rhinitis: Secondary | ICD-10-CM | POA: Diagnosis not present

## 2020-07-19 DIAGNOSIS — J3081 Allergic rhinitis due to animal (cat) (dog) hair and dander: Secondary | ICD-10-CM | POA: Diagnosis not present

## 2020-07-19 DIAGNOSIS — Z170 Estrogen receptor positive status [ER+]: Secondary | ICD-10-CM | POA: Diagnosis not present

## 2020-07-20 ENCOUNTER — Ambulatory Visit
Admission: RE | Admit: 2020-07-20 | Discharge: 2020-07-20 | Disposition: A | Discharge status: Home / Self Care | Payer: 59 | Source: Ambulatory Visit | Attending: Radiation Oncology | Admitting: Radiation Oncology

## 2020-07-20 DIAGNOSIS — Z170 Estrogen receptor positive status [ER+]: Secondary | ICD-10-CM | POA: Diagnosis not present

## 2020-07-20 DIAGNOSIS — Z510 Encounter for antineoplastic radiation therapy: Secondary | ICD-10-CM | POA: Diagnosis not present

## 2020-07-20 DIAGNOSIS — C50411 Malignant neoplasm of upper-outer quadrant of right female breast: Secondary | ICD-10-CM | POA: Diagnosis not present

## 2020-07-20 NOTE — Progress Notes (Signed)
Pt here for patient teaching.  Pt given Radiation and You booklet, skin care instructions, [place]ra deodorant and Sonafine.  Reviewed areas of pertinence such as fatigue, hair loss, skin changes, breast tenderness and breast swelling . Pt able to give teach back of to pat skin and use unscented/gentle soap,apply Sonafine bid, avoid applying anything to skin within 4 hours of treatment, avoid wearing an under wire bra and to use an electric razor if they must shave. Pt verbalizes understanding of information given and will contact nursing with any questions or concerns.     Gloriajean Dell. Leonie Green, BSN

## 2020-07-21 ENCOUNTER — Ambulatory Visit
Admission: RE | Admit: 2020-07-21 | Discharge: 2020-07-21 | Disposition: A | Discharge status: Home / Self Care | Payer: 59 | Source: Ambulatory Visit | Attending: Radiation Oncology | Admitting: Radiation Oncology

## 2020-07-21 ENCOUNTER — Other Ambulatory Visit: Payer: Self-pay

## 2020-07-21 DIAGNOSIS — Z510 Encounter for antineoplastic radiation therapy: Secondary | ICD-10-CM | POA: Diagnosis not present

## 2020-07-21 DIAGNOSIS — C50411 Malignant neoplasm of upper-outer quadrant of right female breast: Secondary | ICD-10-CM | POA: Diagnosis not present

## 2020-07-21 DIAGNOSIS — Z170 Estrogen receptor positive status [ER+]: Secondary | ICD-10-CM

## 2020-07-21 MED ORDER — ALRA NON-METALLIC DEODORANT (RAD-ONC)
1.0000 "application " | Freq: Once | TOPICAL | Status: AC
  Administered 2020-07-21: 1 via TOPICAL

## 2020-07-21 MED ORDER — SONAFINE EX EMUL
1.0000 "application " | Freq: Once | CUTANEOUS | Status: AC
  Administered 2020-07-21: 1 via TOPICAL

## 2020-07-24 ENCOUNTER — Ambulatory Visit: Payer: 59

## 2020-07-24 ENCOUNTER — Other Ambulatory Visit: Payer: Self-pay

## 2020-07-24 ENCOUNTER — Ambulatory Visit
Admission: RE | Admit: 2020-07-24 | Discharge: 2020-07-24 | Disposition: A | Discharge status: Home / Self Care | Payer: 59 | Source: Ambulatory Visit | Attending: Radiation Oncology | Admitting: Radiation Oncology

## 2020-07-24 DIAGNOSIS — Z510 Encounter for antineoplastic radiation therapy: Secondary | ICD-10-CM | POA: Diagnosis not present

## 2020-07-24 DIAGNOSIS — Z483 Aftercare following surgery for neoplasm: Secondary | ICD-10-CM

## 2020-07-24 DIAGNOSIS — Z170 Estrogen receptor positive status [ER+]: Secondary | ICD-10-CM | POA: Diagnosis not present

## 2020-07-24 DIAGNOSIS — C50411 Malignant neoplasm of upper-outer quadrant of right female breast: Secondary | ICD-10-CM | POA: Diagnosis not present

## 2020-07-24 NOTE — Therapy (Signed)
[place], [place], [address] Phone: [phone]   Fax:  [phone]  Physical Therapy Treatment  Patient Details  Name: Nicole Khan MRN: [id number] Date of Birth: 1965/07/06 Referring Provider (PT): Dr. [name]   Encounter Date: 07/24/2020   PT End of Session - 07/24/20 0958    Visit Number 4   # unchanged due to screen only   Number of Visits 6    Date for PT Re-Evaluation 08/01/20    PT Start Time 0948    PT Stop Time 0957    PT Time Calculation (min) 9 min    Activity Tolerance Patient tolerated treatment well    Behavior During Therapy [facility] for tasks assessed/performed           Past Medical History:  Diagnosis Date  . Abnormal mammogram of left breast 09/26/2017  . Abnormal Pap smear of cervix   . Anxiety   . Asthma    exercise induced asthma  . Breast cancer ([place])   . Cancer ([place])    Breast (right)  . Depression   . Diabetes mellitus without complication ([place])    type 2  . Family history of breast cancer 04/11/2020  . Family history of prostate cancer 04/11/2020  . Family history of thyroid cancer 04/11/2020  . Fracture dislocation of right ankle joint 06-04-2013  . Fracture of distal fibula 06-04-2013  . H/O bilateral salpingectomy 05/2015  . History of kidney stones    passed stones, no surgery required  . Hypercholesteremia   . Hypertension   . Polycystic ovary disease   . PONV (postoperative nausea and vomiting)   . Seasonal allergies   . Sleep apnea 05/2017   uses cpap  . SVD (spontaneous vaginal delivery)    x 1    Past Surgical History:  Procedure Laterality Date  . BREAST BIOPSY Right 02/29/2020  . BREAST EXCISIONAL BIOPSY Left 2019   Benign  . BREAST LUMPECTOMY WITH RADIOACTIVE SEED AND SENTINEL LYMPH NODE BIOPSY Right 04/20/2020   Procedure: RIGHT BREAST LUMPECTOMY WITH RADIOACTIVE SEED AND SENTINEL LYMPH NODE BIOPSY;  Surgeon: Jovita Kussmaul, MD;  Location:  [place];  Service: General;  Laterality: Right;  . BREAST LUMPECTOMY WITH RADIOACTIVE SEED LOCALIZATION Left 09/26/2017   Procedure: LEFT BREAST LUMPECTOMY WITH RADIOACTIVE SEED LOCALIZATION ERAS PATHWAY;  Surgeon: Fanny Skates, MD;  Location: [place];  Service: General;  Laterality: Left;  . CARPAL TUNNEL RELEASE     bilateral  . CERVICAL CERCLAGE  07/1991; 05/1999   x 2  . CESAREAN SECTION     x 1  . CHOLECYSTECTOMY, LAPAROSCOPIC  1996  . COLONOSCOPY WITH PROPOFOL N/A 06/24/2017   Procedure: COLONOSCOPY WITH PROPOFOL;  Surgeon: Juanita Craver, MD;  Location: [facility];  Service: Endoscopy;  Laterality: N/A;  . CYSTOSCOPY N/A 06/07/2014   Procedure: CYSTOSCOPY;  Surgeon: Megan Salon, MD;  Location: [facility];  Service: Gynecology;  Laterality: N/A;  . LAPAROSCOPIC BILATERAL SALPINGO OOPHERECTOMY Right 06/07/2014   Procedure: LAPAROSCOPIC RIGHT SALPINGO OOPHORECTOMY/COLLECTION OF PELVIC WASHINGS;  Surgeon: Megan Salon, MD;  Location: [facility];  Service: Gynecology;  Laterality: Right;  . LAPAROSCOPIC UNILATERAL SALPINGECTOMY Left 06/07/2014   Procedure: LAPAROSCOPIC UNILATERAL SALPINGECTOMY;  Surgeon: Megan Salon, MD;  Location: [facility];  Service: Gynecology;  Laterality: Left;  . ORIF ANKLE FRACTURE Right 06/07/2013   Procedure: OPEN REDUCTION INTERNAL FIXATION (ORIF) ANKLE FRACTURE  FIBULA SYNDESMOSIS;  Surgeon: Ninetta Lights, MD;  Location: [place];  Service: Orthopedics;  Laterality: Right;  . RE-EXCISION OF BREAST CANCER,SUPERIOR MARGINS Right 06/01/2020   Procedure: RIGHT BREAST MARGIN REEXCISION;  Surgeon: Jovita Kussmaul, MD;  Location: [place];  Service: General;  Laterality: Right;    There were no vitals filed for this visit.   Subjective Assessment - 07/24/20 0952    Subjective Pt returns for her 3 month L-Dex screen.    Pertinent History right breast cancer diagnosed with bio Pt had reexcision on June 01, 2020.  biopsy on 02/29/20 showed invasive ductal  carcinoma with DCIS, grade 1, HER-2 negative (1+), ER/PR >95%, Ki 67 5%. Surgery is scheduled for Jan 20 and she will have radiation.  Past history includes, DM, sleep apnea, bilaterl carpal tunnel surgery( 2006) and right trigger finger surgery ( 2011)                  L-DEX FLOWSHEETS - 07/24/20 0900      L-DEX LYMPHEDEMA SCREENING   Measurement Type Unilateral    L-DEX MEASUREMENT EXTREMITY Upper Extremity    POSITION  Standing    DOMINANT SIDE Right    At Risk Side Right    BASELINE SCORE (UNILATERAL) 1.8    L-DEX SCORE (UNILATERAL) -5    VALUE CHANGE (UNILAT) -6.8                                  PT Long Term Goals - 07/04/20 1420      PT LONG TERM GOAL #1   Title Pt will be independent in self MLD and scar massage to right breast    Time 4    Period Weeks    Target Date 08/01/20      PT LONG TERM GOAL #2   Title Pt will have decreased fibrosis/swelling at right breast    Time 6    Period Weeks    Target Date 08/01/20      PT LONG TERM GOAL #3   Title Pt will be fit for compression sleeve as indicated    Time 6    Period Weeks    Status On-going    Target Date 08/01/20                 Plan - 07/24/20 0959    Clinical Impression Statement Pt returns for her 3 month L-Dex screen. Her change from baseline of -6.8 is WNLs so no further treatment is required at this time except to cont every 3 omnth L-Dex screens which pt is agreeable to.    PT Next Visit Plan check areolar incision area for swelling, review scar massage MLD prn, benefit of chip pack, check  compression sleeve when she receives; cont every 3 month L-Dex screens    Consulted and Agree with Plan of Care Patient           Patient will benefit from skilled therapeutic intervention in order to improve the following deficits and impairments:     Visit Diagnosis: Aftercare following surgery for neoplasm     Problem List Patient Active Problem List   Diagnosis  Date Noted  . Genetic testing 04/17/2020  . Family history of breast cancer 04/11/2020  . Family history of prostate cancer 04/11/2020  . Family history of thyroid cancer 04/11/2020  . Malignant neoplasm of upper-outer quadrant of right breast in female, estrogen receptor positive ([place]) 03/21/2020  . Abnormal mammogram  of left breast 09/26/2017  . Bilateral ovarian cysts 05/23/2014  . Polycystic ovary disease   . Fracture of distal fibula 06/04/2013  . HTN (hypertension) 04/07/2013  . PCOS (polycystic ovarian syndrome) 04/07/2013  . Diabetes ([place]) 04/07/2013  . Renal calculi 04/07/2013  . Adjustment disorder with mixed anxiety and depressed mood 04/07/2013    Otelia Limes, PTA 07/24/2020, 10:00 AM  [place], [place], [address] Phone: [phone]   Fax:  [phone]  Name: Nirel Babler MRN: [id number] Date of Birth: April 01, 1966

## 2020-07-25 ENCOUNTER — Ambulatory Visit
Admission: RE | Admit: 2020-07-25 | Discharge: 2020-07-25 | Disposition: A | Discharge status: Home / Self Care | Payer: 59 | Source: Ambulatory Visit | Attending: Radiation Oncology | Admitting: Radiation Oncology

## 2020-07-25 DIAGNOSIS — C50411 Malignant neoplasm of upper-outer quadrant of right female breast: Secondary | ICD-10-CM | POA: Diagnosis not present

## 2020-07-25 DIAGNOSIS — Z510 Encounter for antineoplastic radiation therapy: Secondary | ICD-10-CM | POA: Diagnosis not present

## 2020-07-25 DIAGNOSIS — Z170 Estrogen receptor positive status [ER+]: Secondary | ICD-10-CM | POA: Diagnosis not present

## 2020-07-26 ENCOUNTER — Ambulatory Visit
Admission: RE | Admit: 2020-07-26 | Discharge: 2020-07-26 | Disposition: A | Discharge status: Home / Self Care | Payer: 59 | Source: Ambulatory Visit | Attending: Radiation Oncology | Admitting: Radiation Oncology

## 2020-07-26 ENCOUNTER — Other Ambulatory Visit: Payer: Self-pay

## 2020-07-26 DIAGNOSIS — C50411 Malignant neoplasm of upper-outer quadrant of right female breast: Secondary | ICD-10-CM | POA: Diagnosis not present

## 2020-07-26 DIAGNOSIS — Z510 Encounter for antineoplastic radiation therapy: Secondary | ICD-10-CM | POA: Diagnosis not present

## 2020-07-26 DIAGNOSIS — Z170 Estrogen receptor positive status [ER+]: Secondary | ICD-10-CM | POA: Diagnosis not present

## 2020-07-27 ENCOUNTER — Ambulatory Visit
Admission: RE | Admit: 2020-07-27 | Discharge: 2020-07-27 | Disposition: A | Discharge status: Home / Self Care | Payer: 59 | Source: Ambulatory Visit | Attending: Radiation Oncology | Admitting: Radiation Oncology

## 2020-07-27 DIAGNOSIS — J3081 Allergic rhinitis due to animal (cat) (dog) hair and dander: Secondary | ICD-10-CM | POA: Diagnosis not present

## 2020-07-27 DIAGNOSIS — C50411 Malignant neoplasm of upper-outer quadrant of right female breast: Secondary | ICD-10-CM | POA: Diagnosis not present

## 2020-07-27 DIAGNOSIS — Z510 Encounter for antineoplastic radiation therapy: Secondary | ICD-10-CM | POA: Diagnosis not present

## 2020-07-27 DIAGNOSIS — J3089 Other allergic rhinitis: Secondary | ICD-10-CM | POA: Diagnosis not present

## 2020-07-27 DIAGNOSIS — Z170 Estrogen receptor positive status [ER+]: Secondary | ICD-10-CM | POA: Diagnosis not present

## 2020-07-27 DIAGNOSIS — J301 Allergic rhinitis due to pollen: Secondary | ICD-10-CM | POA: Diagnosis not present

## 2020-07-28 ENCOUNTER — Ambulatory Visit
Admission: RE | Admit: 2020-07-28 | Discharge: 2020-07-28 | Disposition: A | Discharge status: Home / Self Care | Payer: 59 | Source: Ambulatory Visit | Attending: Radiation Oncology | Admitting: Radiation Oncology

## 2020-07-28 DIAGNOSIS — Z510 Encounter for antineoplastic radiation therapy: Secondary | ICD-10-CM | POA: Diagnosis not present

## 2020-07-28 DIAGNOSIS — C50411 Malignant neoplasm of upper-outer quadrant of right female breast: Secondary | ICD-10-CM | POA: Diagnosis not present

## 2020-07-28 DIAGNOSIS — Z170 Estrogen receptor positive status [ER+]: Secondary | ICD-10-CM | POA: Diagnosis not present

## 2020-07-31 ENCOUNTER — Ambulatory Visit
Admission: RE | Admit: 2020-07-31 | Discharge: 2020-07-31 | Disposition: A | Discharge status: Home / Self Care | Payer: 59 | Source: Ambulatory Visit | Attending: Radiation Oncology | Admitting: Radiation Oncology

## 2020-07-31 DIAGNOSIS — Z170 Estrogen receptor positive status [ER+]: Secondary | ICD-10-CM | POA: Insufficient documentation

## 2020-07-31 DIAGNOSIS — C50411 Malignant neoplasm of upper-outer quadrant of right female breast: Secondary | ICD-10-CM | POA: Insufficient documentation

## 2020-07-31 DIAGNOSIS — Z510 Encounter for antineoplastic radiation therapy: Secondary | ICD-10-CM | POA: Insufficient documentation

## 2020-08-01 ENCOUNTER — Ambulatory Visit
Admission: RE | Admit: 2020-08-01 | Discharge: 2020-08-01 | Disposition: A | Discharge status: Home / Self Care | Payer: 59 | Source: Ambulatory Visit | Attending: Radiation Oncology | Admitting: Radiation Oncology

## 2020-08-01 DIAGNOSIS — Z170 Estrogen receptor positive status [ER+]: Secondary | ICD-10-CM | POA: Diagnosis not present

## 2020-08-01 DIAGNOSIS — C50411 Malignant neoplasm of upper-outer quadrant of right female breast: Secondary | ICD-10-CM | POA: Diagnosis not present

## 2020-08-01 DIAGNOSIS — Z510 Encounter for antineoplastic radiation therapy: Secondary | ICD-10-CM | POA: Diagnosis not present

## 2020-08-02 ENCOUNTER — Ambulatory Visit
Admission: RE | Admit: 2020-08-02 | Discharge: 2020-08-02 | Disposition: A | Discharge status: Home / Self Care | Payer: 59 | Source: Ambulatory Visit | Attending: Radiation Oncology | Admitting: Radiation Oncology

## 2020-08-02 ENCOUNTER — Other Ambulatory Visit: Payer: Self-pay

## 2020-08-02 DIAGNOSIS — Z510 Encounter for antineoplastic radiation therapy: Secondary | ICD-10-CM | POA: Diagnosis not present

## 2020-08-02 DIAGNOSIS — J301 Allergic rhinitis due to pollen: Secondary | ICD-10-CM | POA: Diagnosis not present

## 2020-08-02 DIAGNOSIS — Z170 Estrogen receptor positive status [ER+]: Secondary | ICD-10-CM | POA: Diagnosis not present

## 2020-08-02 DIAGNOSIS — J3089 Other allergic rhinitis: Secondary | ICD-10-CM | POA: Diagnosis not present

## 2020-08-02 DIAGNOSIS — J3081 Allergic rhinitis due to animal (cat) (dog) hair and dander: Secondary | ICD-10-CM | POA: Diagnosis not present

## 2020-08-02 DIAGNOSIS — C50411 Malignant neoplasm of upper-outer quadrant of right female breast: Secondary | ICD-10-CM | POA: Diagnosis not present

## 2020-08-03 ENCOUNTER — Ambulatory Visit
Admission: RE | Admit: 2020-08-03 | Discharge: 2020-08-03 | Disposition: A | Discharge status: Home / Self Care | Payer: 59 | Source: Ambulatory Visit | Attending: Radiation Oncology | Admitting: Radiation Oncology

## 2020-08-03 DIAGNOSIS — C50411 Malignant neoplasm of upper-outer quadrant of right female breast: Secondary | ICD-10-CM | POA: Diagnosis not present

## 2020-08-03 DIAGNOSIS — Z170 Estrogen receptor positive status [ER+]: Secondary | ICD-10-CM | POA: Diagnosis not present

## 2020-08-03 DIAGNOSIS — Z510 Encounter for antineoplastic radiation therapy: Secondary | ICD-10-CM | POA: Diagnosis not present

## 2020-08-04 ENCOUNTER — Other Ambulatory Visit: Payer: Self-pay

## 2020-08-04 ENCOUNTER — Ambulatory Visit: Payer: 59 | Admitting: Radiation Oncology

## 2020-08-04 ENCOUNTER — Ambulatory Visit
Admission: RE | Admit: 2020-08-04 | Discharge: 2020-08-04 | Disposition: A | Discharge status: Home / Self Care | Payer: 59 | Source: Ambulatory Visit | Attending: Radiation Oncology | Admitting: Radiation Oncology

## 2020-08-04 DIAGNOSIS — Z170 Estrogen receptor positive status [ER+]: Secondary | ICD-10-CM | POA: Diagnosis not present

## 2020-08-04 DIAGNOSIS — C50411 Malignant neoplasm of upper-outer quadrant of right female breast: Secondary | ICD-10-CM | POA: Diagnosis not present

## 2020-08-04 DIAGNOSIS — Z510 Encounter for antineoplastic radiation therapy: Secondary | ICD-10-CM | POA: Diagnosis not present

## 2020-08-07 ENCOUNTER — Other Ambulatory Visit: Payer: Self-pay

## 2020-08-07 ENCOUNTER — Ambulatory Visit
Admission: RE | Admit: 2020-08-07 | Discharge: 2020-08-07 | Disposition: A | Discharge status: Home / Self Care | Payer: 59 | Source: Ambulatory Visit | Attending: Radiation Oncology | Admitting: Radiation Oncology

## 2020-08-07 DIAGNOSIS — Z510 Encounter for antineoplastic radiation therapy: Secondary | ICD-10-CM | POA: Diagnosis not present

## 2020-08-07 DIAGNOSIS — Z170 Estrogen receptor positive status [ER+]: Secondary | ICD-10-CM | POA: Diagnosis not present

## 2020-08-07 DIAGNOSIS — C50411 Malignant neoplasm of upper-outer quadrant of right female breast: Secondary | ICD-10-CM | POA: Diagnosis not present

## 2020-08-07 NOTE — Progress Notes (Signed)
Patient Care Team: [name], MD as PCP - General (Family Medicine) Rockwell Germany, RN as Nurse Navigator Tressie Ellis, Paulette Blanch, RN as Oncology Nurse Navigator  DIAGNOSIS:    ICD-10-CM   1. Malignant neoplasm of upper-outer quadrant of right breast in female, estrogen receptor positive ([place])  C50.411    Z17.0     SUMMARY OF ONCOLOGIC HISTORY: Oncology History  Malignant neoplasm of upper-outer quadrant of right breast in female, estrogen receptor positive ([place])  02/29/2020 Initial Diagnosis   Screening mammogram detected right breast mass. 0.6cm mass at the 10 o'clock position with multiple benign cysts. Biopsy on 02/29/20 showed invasive ductal carcinoma with DCIS, grade 1, HER-2 negative (1+), ER/PR >95%, Ki67: 5%.    03/21/2020 Cancer Staging   Staging form: Breast, AJCC 8th Edition - Clinical stage from 03/21/2020: Stage IA (cT1b, cN0, cM0, G1, ER+, PR+, HER2-) - Signed by Nicholas Lose, MD on 03/21/2020   04/16/2020 Genetic Testing   Negative genetic testing: no pathogenic variants detected in [facility] Multi-Cancer Panel.  The report date is April 16, 2020.    The Multi-Cancer Panel offered by [facility] includes sequencing and/or deletion duplication testing of the following 85 genes: AIP, ALK, APC, ATM, AXIN2,BAP1,  BARD1, BLM, BMPR1A, BRCA1, BRCA2, BRIP1, CASR, CDC73, CDH1, CDK4, CDKN1B, CDKN1C, CDKN2A (p14ARF), CDKN2A (p16INK4a), CEBPA, CHEK2, CTNNA1, DICER1, DIS3L2, EGFR (c.2369C>T, p.Thr790Met variant only), EPCAM (Deletion/duplication testing only), FH, FLCN, GATA2, GPC3, GREM1 (Promoter region deletion/duplication testing only), HOXB13 (c.251G>A, p.Gly84Glu), HRAS, KIT, MAX, MEN1, MET, MITF (c.952G>A, p.Glu318Lys variant only), MLH1, MSH2, MSH3, MSH6, MUTYH, NBN, NF1, NF2, NTHL1, PALB2, PDGFRA, PHOX2B, PMS2, POLD1, POLE, POT1, PRKAR1A, PTCH1, PTEN, RAD50, RAD51C, RAD51D, RB1, RECQL4, RET, RNF43, RUNX1, SDHAF2, SDHA (sequence changes only), SDHB, SDHC, SDHD, SMAD4, SMARCA4,  SMARCB1, SMARCE1, STK11, SUFU, TERC, TERT, TMEM127, TP53, TSC1, TSC2, VHL, WRN and WT1.    04/20/2020 Surgery   Right lumpectomy [name]): invasive and in situ carcinoma, grade 1, 0.9cm, DCIS focally present at margin, 2 right axillary lymph nodes negative for carcinoma.   06/01/2020 Surgery   Re-excision of positive margin [name]): no malignancy identified    07/18/2020 -  Radiation Therapy   Adjuvant radiation     CHIEF COMPLIANT: Follow-up to discuss antiestrogen therapy  INTERVAL HISTORY: Nicole Khan is a 55 y.o. with above-mentioned history of right breast cancer who underwent a right lumpectomy followed by re-excision and is currently on radiation treatment. She presents to the clinic today to discuss antiestrogen therapy.   ALLERGIES:  is allergic to soy allergy, celery oil, eggs or egg-derived products, hazel tree pollen [corylus], and tape.  MEDICATIONS:  Current Outpatient Medications  Medication Sig Dispense Refill  . anastrozole (ARIMIDEX) 1 MG tablet Take 1 tablet (1 mg total) by mouth daily. 90 tablet 3  . acetaminophen (TYLENOL) 500 MG tablet Take 1,000 mg by mouth every 6 (six) hours as needed for headache.    . albuterol (PROVENTIL HFA;VENTOLIN HFA) 108 (90 Base) MCG/ACT inhaler Inhale 1 puff into the lungs every 6 (six) hours as needed for wheezing or shortness of breath.    Marland Kitchen atorvastatin (LIPITOR) 40 MG tablet Take 40 mg by mouth at bedtime.   1  . atorvastatin (LIPITOR) 40 MG tablet TAKE 1 TABLET BY MOUTH EVERY NIGHT AT BEDTIME 90 tablet 3  . azelastine (ASTELIN) 0.1 % nasal spray Place 1 spray into both nostrils every evening. Use in each nostril as directed    . B Complex-C (B-COMPLEX WITH VITAMIN C) tablet Take 1  tablet by mouth every evening.    Marland Kitchen buPROPion (WELLBUTRIN XL) 300 MG 24 hr tablet Take 300 mg by mouth daily.    Marland Kitchen buPROPion (WELLBUTRIN XL) 300 MG 24 hr tablet TAKE 1 TABLET BY MOUTH DAILY EVERY MORNING 90 tablet 0  . citalopram (CELEXA) 20 MG  tablet Take 20 mg by mouth daily.    . citalopram (CELEXA) 20 MG tablet TAKE 1 TABLET BY MOUTH ONCE DAILY. 90 tablet 3  . citalopram (CELEXA) 20 MG tablet TAKE 1 TABLET BY MOUTH ONCE A DAY 90 tablet 0  . Dulaglutide 1.5 MG/0.5ML SOPN INJECT 1.5 MG UNDER THE SKIN ONCE A WEEK 2 mL 11  . Dulaglutide 3 MG/0.5ML SOPN INJECT 3MG'S UNDER THE SKIN ONCE A WEEK 6 mL 4  . empagliflozin (JARDIANCE) 10 MG TABS tablet TAKE 1 TABLET BY MOUTH ONCE A DAY 90 tablet 4  . empagliflozin (JARDIANCE) 10 MG TABS tablet TAKE 1 TABLET BY MOUTH ONCE DAILY 30 tablet 11  . EPINEPHrine 0.3 mg/0.3 mL IJ SOAJ injection Inject 0.3 mg into the muscle as needed for anaphylaxis.    Marland Kitchen HYDROcodone-acetaminophen (NORCO/VICODIN) 5-325 MG tablet TAKE 1 TO 2 TABLETS BY MOUTH EVERY 6 HOURS AS NEEDED FOR MODERATE OR SEVERE PAIN. 10 tablet 0  . HYDROcodone-acetaminophen (NORCO/VICODIN) 5-325 MG tablet TAKE 1-2 TABLETS BY MOUTH EVERY 6 (SIX) HOURS AS NEEDED FOR MODERATE PAIN OR SEVERE PAIN. 15 tablet 0  . JARDIANCE 10 MG TABS tablet Take 10 mg by mouth daily.    Marland Kitchen levocetirizine (XYZAL) 5 MG tablet Take 5 mg by mouth every evening.    . metFORMIN (GLUCOPHAGE-XR) 500 MG 24 hr tablet Take 2,000 mg by mouth daily.    . metFORMIN (GLUCOPHAGE-XR) 500 MG 24 hr tablet TAKE 4 TABLETS BY MOUTH ONCE DAILY WITH MEALS. 120 tablet 11  . metoprolol (LOPRESSOR) 100 MG tablet Take 100 mg by mouth 2 (two) times daily.    . metoprolol tartrate (LOPRESSOR) 100 MG tablet TAKE 1 TABLET BY MOUTH TWICE DAILY. 180 tablet 3  . metoprolol tartrate (LOPRESSOR) 100 MG tablet TAKE 1 TABLET BY MOUTH TWICE A DAY 180 tablet 0  . phenylephrine (SUDAFED PE) 10 MG TABS tablet Take 10 mg by mouth every 6 (six) hours as needed (for congestion).    Marland Kitchen spironolactone (ALDACTONE) 100 MG tablet TAKE 1 TABLET (100 MG TOTAL) BY MOUTH 2 (TWO) TIMES DAILY. 180 tablet 3  . TRULICITY 3 QI/2.9NL SOPN Inject 3 mg into the skin every Friday.    Marland Kitchen UNABLE TO FIND Immunotherapy Allergy Shot.  Injection to each arm once weekly on various day     No current facility-administered medications for this visit.    PHYSICAL EXAMINATION: ECOG PERFORMANCE STATUS: 1 - Symptomatic but completely ambulatory  Vitals:   08/08/20 1511  BP: 108/77  Pulse: 88  Resp: 18  Temp: (!) 97.4 F (36.3 C)  SpO2: 98%   Filed Weights   08/08/20 1511  Weight: 195 lb 14.4 oz (88.9 kg)    LABORATORY DATA:  I have reviewed the data as listed CMP Latest Ref Rng & Units 06/01/2020 04/17/2020 09/24/2017  Glucose 70 - 99 mg/dL 104(H) 140(H) 165(H)  BUN 6 - 20 mg/dL _0 Creatinine 0.44 - 1.00 mg/dL 1.31(H) 1.00 0.93  Sodium 135 - 145 mmol/L 136 138 139  Potassium 3.5 - 5.1 mmol/L 3.7 4.4 4.3  Chloride 98 - 111 mmol/L 103 104 107  CO2 22 - 32 mmol/L 20(L) 24 24  Calcium 8.9 - 10.3 mg/dL 10.0 9.4 9.5  Total Protein 6.5 - 8.1 g/dL - - 6.5  Total Bilirubin 0.3 - 1.2 mg/dL - - 1.2  Alkaline Phos 38 - 126 U/L - - 71  AST 15 - 41 U/L - - 38  ALT 0 - 44 U/L - - 60(H)    Lab Results  Component Value Date   WBC 7.8 04/17/2020   HGB 16.7 (H) 04/17/2020   HCT 52.0 (H) 04/17/2020   MCV 96.7 04/17/2020   PLT 307 04/17/2020   NEUTROABS 5.4 09/24/2017    ASSESSMENT & PLAN:  Malignant neoplasm of upper-outer quadrant of right breast in female, estrogen receptor positive ([place]) 02/29/2020: Screening detected right breast cancer 0.6 cm at 10 o'clock position with multiple benign cysts. Biopsy 02/29/2020: Grade 1 IDC with DCIS ER/PR greater than 95%, Ki-67: 5%, HER-2 negative T1BN0 stage Ia 04/20/2020: Right lumpectomy: Grade 1 IDC 0.9 cm, intermediate grade DCIS, margins negative, DCIS focally positive posterior margin and less than 1 mm from anterior margin.  0/2 lymph nodes negative, 06/01/2020: Margin excision: Benign  Treatment plan: 1.  Adjuvant radiation therapy 07/18/2020-08/09/2018 2. followed by adjuvant antiestrogen therapy with anastrozole 1 mg daily x5 years return to clinic after radiation to  start antiestrogen therapy Anastrozole counseling: We discussed the risks and benefits of anti-estrogen therapy with aromatase inhibitors. These include but not limited to insomnia, hot flashes, mood changes, vaginal dryness, bone density loss, and weight gain. We strongly believe that the benefits far outweigh the risks. Patient understands these risks and consented to starting treatment. Planned treatment duration is 5-7 years.  Return to clinic in 3 months for survivorship care plan visit  No orders of the defined types were placed in this encounter.  The patient has a good understanding of the overall plan. she agrees with it. she will call with any problems that may develop before the next visit here.  Total time spent: 30 mins including face to face time and time spent for planning, charting and coordination of care  Rulon Eisenmenger, MD, MPH 08/08/2020  I, Nicole Khan, am acting as scribe for Dr. Nicholas Lose.  I have reviewed the above documentation for accuracy and completeness, and I agree with the above.

## 2020-08-08 ENCOUNTER — Ambulatory Visit: Payer: 59 | Admitting: Hematology and Oncology

## 2020-08-08 ENCOUNTER — Other Ambulatory Visit: Payer: Self-pay

## 2020-08-08 ENCOUNTER — Inpatient Hospital Stay: Payer: 59 | Attending: Hematology and Oncology | Admitting: Hematology and Oncology

## 2020-08-08 ENCOUNTER — Ambulatory Visit
Admission: RE | Admit: 2020-08-08 | Discharge: 2020-08-08 | Disposition: A | Discharge status: Home / Self Care | Payer: 59 | Source: Ambulatory Visit | Attending: Radiation Oncology | Admitting: Radiation Oncology

## 2020-08-08 ENCOUNTER — Other Ambulatory Visit (HOSPITAL_COMMUNITY): Payer: Self-pay

## 2020-08-08 DIAGNOSIS — Z510 Encounter for antineoplastic radiation therapy: Secondary | ICD-10-CM | POA: Insufficient documentation

## 2020-08-08 DIAGNOSIS — J301 Allergic rhinitis due to pollen: Secondary | ICD-10-CM | POA: Diagnosis not present

## 2020-08-08 DIAGNOSIS — Z170 Estrogen receptor positive status [ER+]: Secondary | ICD-10-CM | POA: Diagnosis not present

## 2020-08-08 DIAGNOSIS — C50411 Malignant neoplasm of upper-outer quadrant of right female breast: Secondary | ICD-10-CM | POA: Insufficient documentation

## 2020-08-08 DIAGNOSIS — J3089 Other allergic rhinitis: Secondary | ICD-10-CM | POA: Diagnosis not present

## 2020-08-08 DIAGNOSIS — J3081 Allergic rhinitis due to animal (cat) (dog) hair and dander: Secondary | ICD-10-CM | POA: Diagnosis not present

## 2020-08-08 MED ORDER — ANASTROZOLE 1 MG PO TABS
1.0000 mg | ORAL_TABLET | Freq: Every day | ORAL | 3 refills | Status: DC
  Filled 2020-08-08 – 2020-08-29 (×2): qty 90, 90d supply, fill #0
  Filled 2020-11-27: qty 90, 90d supply, fill #1
  Filled 2021-03-21: qty 90, 90d supply, fill #2
  Filled 2021-07-04: qty 90, 90d supply, fill #3

## 2020-08-08 NOTE — Assessment & Plan Note (Signed)
02/29/2020: Screening detected right breast cancer 0.6 cm at 10 o'clock position with multiple benign cysts. Biopsy 02/29/2020: Grade 1 IDC with DCIS ER/PR greater than 95%, Ki-67: 5%, HER-2 negative T1BN0 stage Ia 04/20/2020: Right lumpectomy: Grade 1 IDC 0.9 cm, intermediate grade DCIS, margins negative, DCIS focally positive posterior margin and less than 1 mm from anterior margin.  0/2 lymph nodes negative, 06/01/2020: Margin excision: Benign  Treatment plan: 1.  Adjuvant radiation therapy 07/18/2020-08/09/2018 2. followed by adjuvant antiestrogen therapy with anastrozole 1 mg daily x5 years return to clinic after radiation to start antiestrogen therapy   Return to clinic in 3 months for survivorship care plan visit

## 2020-08-09 ENCOUNTER — Encounter: Payer: Self-pay | Admitting: *Deleted

## 2020-08-09 ENCOUNTER — Ambulatory Visit
Admission: RE | Admit: 2020-08-09 | Discharge: 2020-08-09 | Disposition: A | Discharge status: Home / Self Care | Payer: 59 | Source: Ambulatory Visit | Attending: Radiation Oncology | Admitting: Radiation Oncology

## 2020-08-09 DIAGNOSIS — Z510 Encounter for antineoplastic radiation therapy: Secondary | ICD-10-CM | POA: Diagnosis not present

## 2020-08-09 DIAGNOSIS — Z170 Estrogen receptor positive status [ER+]: Secondary | ICD-10-CM | POA: Diagnosis not present

## 2020-08-09 DIAGNOSIS — C50411 Malignant neoplasm of upper-outer quadrant of right female breast: Secondary | ICD-10-CM | POA: Diagnosis not present

## 2020-08-10 ENCOUNTER — Ambulatory Visit
Admission: RE | Admit: 2020-08-10 | Discharge: 2020-08-10 | Disposition: A | Discharge status: Home / Self Care | Payer: 59 | Source: Ambulatory Visit | Attending: Radiation Oncology | Admitting: Radiation Oncology

## 2020-08-10 DIAGNOSIS — C50411 Malignant neoplasm of upper-outer quadrant of right female breast: Secondary | ICD-10-CM | POA: Diagnosis not present

## 2020-08-10 DIAGNOSIS — Z170 Estrogen receptor positive status [ER+]: Secondary | ICD-10-CM | POA: Diagnosis not present

## 2020-08-10 DIAGNOSIS — Z510 Encounter for antineoplastic radiation therapy: Secondary | ICD-10-CM | POA: Diagnosis not present

## 2020-08-11 ENCOUNTER — Other Ambulatory Visit: Payer: Self-pay

## 2020-08-11 ENCOUNTER — Ambulatory Visit
Admission: RE | Admit: 2020-08-11 | Discharge: 2020-08-11 | Disposition: A | Discharge status: Home / Self Care | Payer: 59 | Source: Ambulatory Visit | Attending: Radiation Oncology | Admitting: Radiation Oncology

## 2020-08-11 ENCOUNTER — Encounter: Payer: Self-pay | Admitting: Radiation Oncology

## 2020-08-11 DIAGNOSIS — Z170 Estrogen receptor positive status [ER+]: Secondary | ICD-10-CM | POA: Diagnosis not present

## 2020-08-11 DIAGNOSIS — C50411 Malignant neoplasm of upper-outer quadrant of right female breast: Secondary | ICD-10-CM | POA: Diagnosis not present

## 2020-08-11 DIAGNOSIS — Z510 Encounter for antineoplastic radiation therapy: Secondary | ICD-10-CM | POA: Diagnosis not present

## 2020-08-16 ENCOUNTER — Other Ambulatory Visit (HOSPITAL_COMMUNITY): Payer: Self-pay

## 2020-08-16 DIAGNOSIS — J3089 Other allergic rhinitis: Secondary | ICD-10-CM | POA: Diagnosis not present

## 2020-08-16 DIAGNOSIS — J3081 Allergic rhinitis due to animal (cat) (dog) hair and dander: Secondary | ICD-10-CM | POA: Diagnosis not present

## 2020-08-16 DIAGNOSIS — J301 Allergic rhinitis due to pollen: Secondary | ICD-10-CM | POA: Diagnosis not present

## 2020-08-17 NOTE — Progress Notes (Signed)
  Patient Name: Nicole Khan MRN: [id number] DOB: 01/18/1966 Referring Physician: [name] (Profile Not Attached) Date of Service: 08/11/2020 [facility], [place]                                                        End Of Treatment Note  Diagnoses: C50.411-Malignant neoplasm of upper-outer quadrant of right female breast  Cancer Staging:  Stage IA, pT1bN0M0 grade 1, ER/PR positive invasive ductal carcinoma of the right breast.  Intent: Curative  Radiation Treatment Dates: 07/17/2020 through 08/11/2020 Site Technique Total Dose (Gy) Dose per Fx (Gy) Completed Fx Beam Energies  Breast, Right: Breast_Rt 3D 42.56/42.56 2.66 16/16 6X, 10X  Breast, Right: Breast_Rt_Bst 3D 8/8 2 4/4 6X, 10X   Narrative: The patient tolerated radiation therapy relatively well. She did have hyperpigmentation and also had more prominent fatigue.  Plan: The patient will receive a call in about one month from the radiation oncology department. She will continue follow up with Dr. Lindi Adie as well.   ________________________________________________    Carola Rhine, [facility]

## 2020-08-22 ENCOUNTER — Other Ambulatory Visit (HOSPITAL_COMMUNITY): Payer: Self-pay

## 2020-08-22 MED ORDER — BUPROPION HCL ER (XL) 300 MG PO TB24
ORAL_TABLET | ORAL | 1 refills | Status: DC
  Filled 2020-08-22: qty 90, 90d supply, fill #0
  Filled 2020-12-26: qty 90, 90d supply, fill #1

## 2020-08-22 MED FILL — Dulaglutide Soln Auto-injector 3 MG/0.5ML: SUBCUTANEOUS | 84 days supply | Qty: 6 | Fill #0 | Status: AC

## 2020-08-23 ENCOUNTER — Other Ambulatory Visit (HOSPITAL_COMMUNITY): Payer: Self-pay

## 2020-08-24 DIAGNOSIS — J3089 Other allergic rhinitis: Secondary | ICD-10-CM | POA: Diagnosis not present

## 2020-08-24 DIAGNOSIS — J301 Allergic rhinitis due to pollen: Secondary | ICD-10-CM | POA: Diagnosis not present

## 2020-08-24 DIAGNOSIS — J3081 Allergic rhinitis due to animal (cat) (dog) hair and dander: Secondary | ICD-10-CM | POA: Diagnosis not present

## 2020-08-29 ENCOUNTER — Other Ambulatory Visit (HOSPITAL_COMMUNITY): Payer: Self-pay

## 2020-08-29 ENCOUNTER — Other Ambulatory Visit: Payer: Self-pay | Admitting: Obstetrics and Gynecology

## 2020-08-29 DIAGNOSIS — J301 Allergic rhinitis due to pollen: Secondary | ICD-10-CM | POA: Diagnosis not present

## 2020-08-29 DIAGNOSIS — I10 Essential (primary) hypertension: Secondary | ICD-10-CM

## 2020-08-29 DIAGNOSIS — J3081 Allergic rhinitis due to animal (cat) (dog) hair and dander: Secondary | ICD-10-CM | POA: Diagnosis not present

## 2020-08-30 ENCOUNTER — Other Ambulatory Visit (HOSPITAL_COMMUNITY): Payer: Self-pay

## 2020-08-30 DIAGNOSIS — J3089 Other allergic rhinitis: Secondary | ICD-10-CM | POA: Diagnosis not present

## 2020-08-31 ENCOUNTER — Other Ambulatory Visit (HOSPITAL_COMMUNITY): Payer: Self-pay

## 2020-08-31 ENCOUNTER — Other Ambulatory Visit: Payer: Self-pay | Admitting: *Deleted

## 2020-08-31 DIAGNOSIS — I10 Essential (primary) hypertension: Secondary | ICD-10-CM

## 2020-08-31 MED ORDER — SPIRONOLACTONE 100 MG PO TABS
100.0000 mg | ORAL_TABLET | Freq: Two times a day (BID) | ORAL | 0 refills | Status: DC
  Filled 2020-08-31: qty 60, 30d supply, fill #0

## 2020-08-31 NOTE — Telephone Encounter (Signed)
Medication refill request: Spironolactone Last AEX:  06-08-19 BS Next AEX: 09-21-20 Last MMG (if hormonal medication request): n/a Refill authorized: Today, please advise.   Medication pended for #60, 0RF. Please refill if appropriate.

## 2020-09-01 DIAGNOSIS — E1165 Type 2 diabetes mellitus with hyperglycemia: Secondary | ICD-10-CM | POA: Diagnosis not present

## 2020-09-01 DIAGNOSIS — Z7984 Long term (current) use of oral hypoglycemic drugs: Secondary | ICD-10-CM | POA: Diagnosis not present

## 2020-09-01 DIAGNOSIS — E282 Polycystic ovarian syndrome: Secondary | ICD-10-CM | POA: Diagnosis not present

## 2020-09-01 DIAGNOSIS — Z87442 Personal history of urinary calculi: Secondary | ICD-10-CM | POA: Diagnosis not present

## 2020-09-04 DIAGNOSIS — J3081 Allergic rhinitis due to animal (cat) (dog) hair and dander: Secondary | ICD-10-CM | POA: Diagnosis not present

## 2020-09-04 DIAGNOSIS — J301 Allergic rhinitis due to pollen: Secondary | ICD-10-CM | POA: Diagnosis not present

## 2020-09-04 DIAGNOSIS — J3089 Other allergic rhinitis: Secondary | ICD-10-CM | POA: Diagnosis not present

## 2020-09-13 ENCOUNTER — Other Ambulatory Visit (HOSPITAL_COMMUNITY): Payer: Self-pay

## 2020-09-13 MED ORDER — ATORVASTATIN CALCIUM 40 MG PO TABS
1.0000 | ORAL_TABLET | Freq: Every evening | ORAL | 3 refills | Status: DC
  Filled 2020-09-13: qty 90, 90d supply, fill #0

## 2020-09-13 MED FILL — Metformin HCl Tab ER 24HR 500 MG: ORAL | 30 days supply | Qty: 120 | Fill #0 | Status: AC

## 2020-09-13 MED FILL — Metoprolol Tartrate Tab 100 MG: ORAL | 90 days supply | Qty: 180 | Fill #0 | Status: AC

## 2020-09-13 MED FILL — Citalopram Hydrobromide Tab 20 MG (Base Equiv): ORAL | 90 days supply | Qty: 90 | Fill #0 | Status: AC

## 2020-09-21 ENCOUNTER — Ambulatory Visit (INDEPENDENT_AMBULATORY_CARE_PROVIDER_SITE_OTHER): Payer: 59 | Admitting: Obstetrics and Gynecology

## 2020-09-21 ENCOUNTER — Other Ambulatory Visit (HOSPITAL_COMMUNITY): Payer: Self-pay

## 2020-09-21 ENCOUNTER — Encounter: Payer: Self-pay | Admitting: Obstetrics and Gynecology

## 2020-09-21 ENCOUNTER — Other Ambulatory Visit: Payer: Self-pay

## 2020-09-21 VITALS — BP 110/68 | HR 83 | Ht 61.5 in | Wt 188.0 lb

## 2020-09-21 DIAGNOSIS — I10 Essential (primary) hypertension: Secondary | ICD-10-CM

## 2020-09-21 DIAGNOSIS — Z124 Encounter for screening for malignant neoplasm of cervix: Secondary | ICD-10-CM | POA: Diagnosis not present

## 2020-09-21 DIAGNOSIS — J3081 Allergic rhinitis due to animal (cat) (dog) hair and dander: Secondary | ICD-10-CM | POA: Diagnosis not present

## 2020-09-21 DIAGNOSIS — Z01419 Encounter for gynecological examination (general) (routine) without abnormal findings: Secondary | ICD-10-CM

## 2020-09-21 DIAGNOSIS — J3089 Other allergic rhinitis: Secondary | ICD-10-CM | POA: Diagnosis not present

## 2020-09-21 DIAGNOSIS — J301 Allergic rhinitis due to pollen: Secondary | ICD-10-CM | POA: Diagnosis not present

## 2020-09-21 MED ORDER — SPIRONOLACTONE 100 MG PO TABS
100.0000 mg | ORAL_TABLET | Freq: Two times a day (BID) | ORAL | 0 refills | Status: DC
  Filled 2020-09-21: qty 180, 90d supply, fill #0

## 2020-09-21 NOTE — Progress Notes (Signed)
55 y.o. [id number] Married Caucasian female here for annual exam.    Received her first Covid booster.   Asking about spironolactone.  It helps with acne hair loss on head and increased hair on face.   PCP:  [name], MD Endocrinology:  Delrae Rend, MD  Patient's last menstrual period was 12/31/2015 (within weeks).           Sexually active: Yes.    The current method of family planning is vasectomy.    Exercising: No.  The patient does not participate in regular exercise at present. Smoker:  no  Health Maintenance: Pap: 06-08-19 LGSIL:Neg HR HPV, 04-14-14 Neg:Neg HR HPV, 04-23-17 Neg:Neg HR HPV History of abnormal Pap:  yes, remote past. Follow up normal MMG: SEE Epic.  Right breast cancer.  Colonoscopy:  05/2017 polyps;next 5 years BMD:   2017  Result :Normal with [place] TDaP:  2015 Gardasil:   no HIV:Neg in preg Hep C:no Screening Labs:  oncology, Endocrinology.   reports that she has never smoked. She has never used smokeless tobacco. She reports that she does not drink alcohol and does not use drugs.  Past Medical History:  Diagnosis Date   Abnormal mammogram of left breast 09/26/2017   Abnormal Pap smear of cervix    Anxiety    Asthma    exercise induced asthma   Breast cancer ([place])    Cancer ([place])    Breast (right)   Depression    Diabetes mellitus without complication ([place])    type 2   Family history of breast cancer 04/11/2020   Family history of prostate cancer 04/11/2020   Family history of thyroid cancer 04/11/2020   Fracture dislocation of right ankle joint 06-04-2013   Fracture of distal fibula 06-04-2013   H/O bilateral salpingectomy 05/2015   History of kidney stones    passed stones, no surgery required   Hypercholesteremia    Hypertension    Polycystic ovary disease    PONV (postoperative nausea and vomiting)    Seasonal allergies    Sleep apnea 05/2017   uses cpap   SVD (spontaneous vaginal delivery)    x 1    Past Surgical History:  Procedure  Laterality Date   BREAST BIOPSY Right 02/29/2020   BREAST EXCISIONAL BIOPSY Left 2019   Benign   BREAST LUMPECTOMY WITH RADIOACTIVE SEED AND SENTINEL LYMPH NODE BIOPSY Right 04/20/2020   Procedure: RIGHT BREAST LUMPECTOMY WITH RADIOACTIVE SEED AND SENTINEL LYMPH NODE BIOPSY;  Surgeon: Jovita Kussmaul, MD;  Location: [facility];  Service: General;  Laterality: Right;   BREAST LUMPECTOMY WITH RADIOACTIVE SEED LOCALIZATION Left 09/26/2017   Procedure: LEFT BREAST LUMPECTOMY [place];  Surgeon: Fanny Skates, MD;  Location: [facility];  Service: General;  Laterality: Left;   CARPAL TUNNEL RELEASE     bilateral   CERVICAL CERCLAGE  07/1991; 05/1999   x 2   CESAREAN SECTION     x 1   CHOLECYSTECTOMY, LAPAROSCOPIC  1996   COLONOSCOPY WITH PROPOFOL N/A 06/24/2017   Procedure: COLONOSCOPY WITH PROPOFOL;  Surgeon: Juanita Craver, MD;  [facility];  Service: Endoscopy;  Laterality: N/A;   CYSTOSCOPY N/A 06/07/2014   Procedure: CYSTOSCOPY;  Surgeon: Megan Salon, MD;  Location: [facility];  Service: Gynecology;  Laterality: N/A;   LAPAROSCOPIC BILATERAL SALPINGO OOPHERECTOMY Right 06/07/2014   Procedure: LAPAROSCOPIC RIGHT SALPINGO OOPHORECTOMY/COLLECTION OF PELVIC WASHINGS;  Surgeon: Megan Salon, MD;  Location: [facility];  Service: Gynecology;  Laterality: Right;   LAPAROSCOPIC UNILATERAL SALPINGECTOMY Left 06/07/2014   Procedure: LAPAROSCOPIC UNILATERAL SALPINGECTOMY;  Surgeon: Megan Salon, MD;  Location: [facility];  Service: Gynecology;  Laterality: Left;   ORIF ANKLE FRACTURE Right 06/07/2013   Procedure: OPEN REDUCTION INTERNAL FIXATION (ORIF) ANKLE FRACTURE  FIBULA SYNDESMOSIS;  Surgeon: Ninetta Lights, MD;  Location: [facility];  Service: Orthopedics;  Laterality: Right;   RE-EXCISION OF BREAST CANCER,SUPERIOR MARGINS Right 06/01/2020   Procedure: RIGHT BREAST MARGIN REEXCISION;  Surgeon: [name], MD;  Location: [facility];  Service:  General;  Laterality: Right;    Current Outpatient Medications  Medication Sig Dispense Refill   acetaminophen (TYLENOL) 500 MG tablet Take 1,000 mg by mouth every 6 (six) hours as needed for headache.     albuterol (PROVENTIL HFA;VENTOLIN HFA) 108 (90 Base) MCG/ACT inhaler Inhale 1 puff into the lungs every 6 (six) hours as needed for wheezing or shortness of breath.     anastrozole (ARIMIDEX) 1 MG tablet Take 1 tablet (1 mg total) by mouth daily. 90 tablet 3   atorvastatin (LIPITOR) 40 MG tablet Take 40 mg by mouth at bedtime.   1   azelastine (ASTELIN) 0.1 % nasal spray Place 1 spray into both nostrils every evening. Use in each nostril as directed     B Complex-C (B-COMPLEX WITH VITAMIN C) tablet Take 1 tablet by mouth every evening.     buPROPion (WELLBUTRIN XL) 300 MG 24 hr tablet Take 300 mg by mouth daily.     buPROPion (WELLBUTRIN XL) 300 MG 24 hr tablet TAKE 1 TABLET BY MOUTH DAILY EVERY MORNING 90 tablet 1   citalopram (CELEXA) 20 MG tablet Take 20 mg by mouth daily.     EPINEPHrine 0.3 mg/0.3 mL IJ SOAJ injection Inject 0.3 mg into the muscle as needed for anaphylaxis.     JARDIANCE 10 MG TABS tablet Take 10 mg by mouth daily.     levocetirizine (XYZAL) 5 MG tablet Take 5 mg by mouth every evening.     metFORMIN (GLUCOPHAGE-XR) 500 MG 24 hr tablet Take 2,000 mg by mouth daily.     metoprolol (LOPRESSOR) 100 MG tablet Take 100 mg by mouth 2 (two) times daily.     phenylephrine (SUDAFED PE) 10 MG TABS tablet Take 10 mg by mouth every 6 (six) hours as needed (for congestion).     spironolactone (ALDACTONE) 100 MG tablet Take 1 tablet (100 mg total) by mouth 2 (two) times daily. 60 tablet 0   TRULICITY 3 DH/6.8SH SOPN Inject 3 mg into the skin every Friday.     UNABLE TO FIND Immunotherapy Allergy Shot. Injection to each arm once weekly on various day     No current facility-administered medications for this visit.    Family History  Problem Relation Age of Onset   Hypertension  Mother    Infertility Mother    Deep vein thrombosis Mother    Anxiety disorder Mother    Depression Mother    Hepatitis C Mother        blood transfusion   Aneurysm Mother        Japanese   Cancer Father        Gallbladder; dx 83s   Hypertension Father    Anxiety disorder Father    Depression Father    Multiple births Sister    Thyroid disease Sister    Anxiety disorder Sister    Depression Sister    Breast cancer Sister 52  Thyroid cancer Sister 92       unknown type   Hypertension Brother    Anxiety disorder Brother    Depression Brother    Prostate cancer Paternal Uncle        dx 17s   Lung cancer Half-Sister 26       maternal half sister; no smoking hx    Review of Systems  All other systems reviewed and are negative.  Exam:   BP 110/68 (Cuff Size: Large)   Pulse 83   Ht 5' 1.5" (1.562 m)   Wt 188 lb (85.3 kg)   LMP 12/31/2015 (Within Weeks)   SpO2 98%   BMI 34.95 kg/m     General appearance: alert, cooperative and appears stated age Head: normocephalic, without obvious abnormality, atraumatic Neck: no adenopathy, supple, symmetrical, trachea midline and thyroid normal to inspection and palpation Lungs: clear to auscultation bilaterally Breasts: right - scar noted. no masses or tenderness, No nipple retraction or dimpling, No nipple discharge or bleeding, No axillary adenopathy Left - no masses or tenderness, No nipple retraction or dimpling, No nipple discharge or bleeding, No axillary adenopathy Heart: regular rate and rhythm Abdomen: pannus.  Abdomen is soft, non-tender; no masses, no organomegaly Extremities: extremities normal, atraumatic, no cyanosis or edema Skin: skin color, texture, turgor normal. No rashes or lesions Lymph nodes: cervical, supraclavicular, and axillary nodes normal. Neurologic: grossly normal  Pelvic: External genitalia:  no lesions              No abnormal inguinal nodes palpated.              Urethra:  normal appearing  urethra with no masses, tenderness or lesions              Bartholins and Skenes: normal                 Vagina: normal appearing vagina with normal color and discharge, no lesions              Cervix: no lesions              Pap taken: yes Bimanual Exam:  Uterus:  normal size, contour, position, consistency, mobility, non-tender              Adnexa: no mass, fullness, tenderness              Rectal exam: yes  Confirms.              Anus:  normal sphincter tone, no lesions  Chaperone was present for exam.  Assessment:   Well woman visit with normal exam. Status post bilateral salpingectomy and right oophorectomy. Last pap LGSIL, neg HR HPV. Hx right breast cancer.  Status post lumpectomy with re-excision, XRT and anastrozole.  Genetic testing negative.  Hirsutism and acne.  On Spironolactone.   Plan: Mammogram screening discussed. Self breast awareness reviewed. Pap and HR HPV as above. Guidelines for Calcium, Vitamin D, regular exercise program including cardiovascular and weight bearing exercise. We reviewed spironolactone in Up to Date.  No conclusive increased risk of breast cancer with use.  I refilled for the next 3 months.  She will discuss further with her oncologist in August.  She does feel she has benefits from the medication.  Follow up annually and prn.   After visit summary provided.

## 2020-09-21 NOTE — Patient Instructions (Signed)

## 2020-09-25 ENCOUNTER — Other Ambulatory Visit (HOSPITAL_COMMUNITY): Payer: Self-pay

## 2020-09-27 DIAGNOSIS — J301 Allergic rhinitis due to pollen: Secondary | ICD-10-CM | POA: Diagnosis not present

## 2020-09-27 DIAGNOSIS — J3089 Other allergic rhinitis: Secondary | ICD-10-CM | POA: Diagnosis not present

## 2020-09-27 DIAGNOSIS — J3081 Allergic rhinitis due to animal (cat) (dog) hair and dander: Secondary | ICD-10-CM | POA: Diagnosis not present

## 2020-09-27 LAB — CYTOLOGY - PAP
Comment: NEGATIVE
Diagnosis: NEGATIVE
Diagnosis: REACTIVE
High risk HPV: NEGATIVE

## 2020-09-28 NOTE — Progress Notes (Signed)
  Radiation Oncology         [phone]) [phone] ________________________________  Name: Nicole Khan MRN: [id number]  Date of Service: 10/09/2020  DOB: [birth year]  Post Treatment Telephone Note  Diagnosis:   Stage IA, pT1bN0M0 grade 1, ER/PR positive invasive ductal carcinoma of the right breast.  Interval Since Last Radiation:  9 weeks   07/17/2020 through 08/11/2020 Site Technique Total Dose (Gy) Dose per Fx (Gy) Completed Fx Beam Energies  Breast, Right: Breast_Rt 3D 42.56/42.56 2.66 16/16 6X, 10X  Breast, Right: Breast_Rt_Bst 3D 8/8 2 4/4 6X, 10X    Narrative:  The patient was contacted today for routine follow-up. During treatment she did very well with radiotherapy and did not have significant desquamation. She reports she is doing well. Her skin has healed up nicely and she does still have times where she notes fatigue but this is improved in the past few weeks.  Impression/Plan: 1. Stage IA, pT1bN0M0 grade 1, ER/PR positive invasive ductal carcinoma of the right breast.The patient has been doing well since completion of radiotherapy. We discussed that we would be happy to continue to follow her as needed, but she will also continue to follow up with Dr. Lindi Adie in medical oncology. She was counseled on skin care as well as measures to avoid sun exposure to this area.  2. Survivorship. We discussed the importance of survivorship evaluation and encouraged her to attend her upcoming visit with that clinic.      Carola Rhine, PAC

## 2020-10-09 ENCOUNTER — Ambulatory Visit
Admission: RE | Admit: 2020-10-09 | Discharge: 2020-10-09 | Disposition: A | Discharge status: Home / Self Care | Payer: 59 | Source: Ambulatory Visit | Attending: Radiation Oncology | Admitting: Radiation Oncology

## 2020-10-09 ENCOUNTER — Other Ambulatory Visit (HOSPITAL_COMMUNITY): Payer: Self-pay

## 2020-10-09 DIAGNOSIS — Z170 Estrogen receptor positive status [ER+]: Secondary | ICD-10-CM

## 2020-10-09 DIAGNOSIS — C50411 Malignant neoplasm of upper-outer quadrant of right female breast: Secondary | ICD-10-CM

## 2020-10-13 ENCOUNTER — Other Ambulatory Visit (HOSPITAL_COMMUNITY): Payer: Self-pay

## 2020-10-13 MED ORDER — METFORMIN HCL ER 500 MG PO TB24
2000.0000 mg | ORAL_TABLET | Freq: Every day | ORAL | 3 refills | Status: DC
  Filled 2020-10-13: qty 360, 90d supply, fill #0
  Filled 2021-01-29: qty 360, 90d supply, fill #1
  Filled 2021-05-04: qty 360, 90d supply, fill #2
  Filled 2021-08-21: qty 360, 90d supply, fill #3

## 2020-10-13 MED ORDER — ATORVASTATIN CALCIUM 40 MG PO TABS
40.0000 mg | ORAL_TABLET | Freq: Every day | ORAL | 3 refills | Status: DC
  Filled 2020-10-13: qty 90, 90d supply, fill #0
  Filled 2021-01-16: qty 90, 90d supply, fill #1
  Filled 2021-05-04: qty 90, 90d supply, fill #2
  Filled 2021-08-08: qty 90, 90d supply, fill #3

## 2020-10-13 MED FILL — Empagliflozin Tab 10 MG: ORAL | 90 days supply | Qty: 90 | Fill #1 | Status: AC

## 2020-10-23 ENCOUNTER — Ambulatory Visit: Payer: 59 | Attending: Hematology and Oncology

## 2020-10-23 ENCOUNTER — Other Ambulatory Visit: Payer: Self-pay

## 2020-10-23 VITALS — Wt 188.1 lb

## 2020-10-23 DIAGNOSIS — Z483 Aftercare following surgery for neoplasm: Secondary | ICD-10-CM | POA: Insufficient documentation

## 2020-10-23 NOTE — Therapy (Signed)
[place], [place], [address] Phone: [phone]   Fax:  [phone]  Physical Therapy Treatment  Patient Details  Name: Nicole Khan MRN: [id number] Date of Birth: 09-15-1965 Referring Provider (PT): Dr. [name]   Encounter Date: 10/23/2020   PT End of Session - 10/23/20 1026     Visit Number 4   # unchanged due to screen only   PT Start Time 1021    PT Stop Time 1028    PT Time Calculation (min) 7 min    Activity Tolerance Patient tolerated treatment well    Behavior During Therapy [facility] for tasks assessed/performed             Past Medical History:  Diagnosis Date   Abnormal mammogram of left breast 09/26/2017   Abnormal Pap smear of cervix    Anxiety    Asthma    exercise induced asthma   Breast cancer ([place])    Cancer ([place])    Breast (right)   Depression    Diabetes mellitus without complication ([place])    type 2   Family history of breast cancer 04/11/2020   Family history of prostate cancer 04/11/2020   Family history of thyroid cancer 04/11/2020   Fracture dislocation of right ankle joint 06-04-2013   Fracture of distal fibula 06-04-2013   H/O bilateral salpingectomy 05/2015   History of kidney stones    passed stones, no surgery required   Hypercholesteremia    Hypertension    Polycystic ovary disease    PONV (postoperative nausea and vomiting)    Seasonal allergies    Sleep apnea 05/2017   uses cpap   SVD (spontaneous vaginal delivery)    x 1    Past Surgical History:  Procedure Laterality Date   BREAST BIOPSY Right 02/29/2020   BREAST EXCISIONAL BIOPSY Left 2019   Benign   BREAST LUMPECTOMY WITH RADIOACTIVE SEED AND SENTINEL LYMPH NODE BIOPSY Right 04/20/2020   Procedure: RIGHT BREAST LUMPECTOMY WITH RADIOACTIVE SEED AND SENTINEL LYMPH NODE BIOPSY;  Surgeon: Jovita Kussmaul, MD;  Location: [facility];  Service: General;  Laterality: Right;   BREAST LUMPECTOMY WITH  RADIOACTIVE SEED LOCALIZATION Left 09/26/2017   Procedure: LEFT BREAST LUMPECTOMY [place];  Surgeon: Fanny Skates, MD;  Location: [facility];  Service: General;  Laterality: Left;   CARPAL TUNNEL RELEASE     bilateral   CERVICAL CERCLAGE  07/1991; 05/1999   x 2   CESAREAN SECTION     x 1   CHOLECYSTECTOMY, LAPAROSCOPIC  1996   COLONOSCOPY WITH PROPOFOL N/A 06/24/2017   Procedure: COLONOSCOPY WITH PROPOFOL;  Surgeon: Juanita Craver, MD;  [facility];  Service: Endoscopy;  Laterality: N/A;   CYSTOSCOPY N/A 06/07/2014   Procedure: CYSTOSCOPY;  Surgeon: Megan Salon, MD;  Location: [facility];  Service: Gynecology;  Laterality: N/A;   LAPAROSCOPIC BILATERAL SALPINGO OOPHERECTOMY Right 06/07/2014   Procedure: LAPAROSCOPIC RIGHT SALPINGO OOPHORECTOMY/COLLECTION OF PELVIC WASHINGS;  Surgeon: Megan Salon, MD;  Location: [facility];  Service: Gynecology;  Laterality: Right;   LAPAROSCOPIC UNILATERAL SALPINGECTOMY Left 06/07/2014   Procedure: LAPAROSCOPIC UNILATERAL SALPINGECTOMY;  Surgeon: Megan Salon, MD;  Location: [facility];  Service: Gynecology;  Laterality: Left;   ORIF ANKLE FRACTURE Right 06/07/2013   Procedure: OPEN REDUCTION INTERNAL FIXATION (ORIF) ANKLE FRACTURE  FIBULA SYNDESMOSIS;  Surgeon: Ninetta Lights, MD;  Location: [facility];  Service: Orthopedics;  Laterality: Right;  RE-EXCISION OF BREAST CANCER,SUPERIOR MARGINS Right 06/01/2020   Procedure: RIGHT BREAST MARGIN REEXCISION;  Surgeon: Jovita Kussmaul, MD;  Location: [facility];  Service: General;  Laterality: Right;    Vitals:   10/23/20 1025  Weight: 188 lb 2 oz (85.3 kg)     Subjective Assessment - 10/23/20 1026     Subjective Pt returns for her 3 month L-Dex screen.    Pertinent History right breast cancer diagnosed with bio Pt had reexcision on June 01, 2020.  biopsy on 02/29/20 showed invasive ductal carcinoma with DCIS, grade 1, HER-2 negative (1+), ER/PR >95%,  Ki 67 5%. Surgery is scheduled for Jan 20 and she will have radiation.  Past history includes, DM, sleep apnea, bilaterl carpal tunnel surgery( 2006) and right trigger finger surgery ( 2011)                    L-DEX FLOWSHEETS - 10/23/20 1000       L-DEX LYMPHEDEMA SCREENING   Measurement Type Unilateral    L-DEX MEASUREMENT EXTREMITY Upper Extremity    POSITION  Standing    DOMINANT SIDE Right    At Risk Side Right    BASELINE SCORE (UNILATERAL) 1.8    L-DEX SCORE (UNILATERAL) -2.3    VALUE CHANGE (UNILAT) -4.1                                    PT Long Term Goals - 07/04/20 1420       PT LONG TERM GOAL #1   Title Pt will be independent in self MLD and scar massage to right breast    Time 4    Period Weeks    Target Date 08/01/20      PT LONG TERM GOAL #2   Title Pt will have decreased fibrosis/swelling at right breast    Time 6    Period Weeks    Target Date 08/01/20      PT LONG TERM GOAL #3   Title Pt will be fit for compression sleeve as indicated    Time 6    Period Weeks    Status On-going    Target Date 08/01/20                   Plan - 10/23/20 1029     Clinical Impression Statement Pt returns for her 3 month L-Dex screen. Her change from baseline of -4.1 is WNLs so no further treatment is required at this time except to cont every 3 month L-Dex screens which pt is agreeable to .    PT Next Visit Plan Cont every 3 month L-Dex screens for up to 2 years from her SLNB.    Consulted and Agree with Plan of Care Patient             Patient will benefit from skilled therapeutic intervention in order to improve the following deficits and impairments:     Visit Diagnosis: Aftercare following surgery for neoplasm     Problem List Patient Active Problem List   Diagnosis Date Noted   Genetic testing 04/17/2020   Family history of breast cancer 04/11/2020   Family history of prostate cancer 04/11/2020    Family history of thyroid cancer 04/11/2020   Malignant neoplasm of upper-outer quadrant of right breast in female, estrogen receptor positive ([place]) 03/21/2020   Abnormal mammogram of left breast 09/26/2017   Bilateral  ovarian cysts 05/23/2014   Polycystic ovary disease    Fracture of distal fibula 06/04/2013   HTN (hypertension) 04/07/2013   PCOS (polycystic ovarian syndrome) 04/07/2013   Diabetes ([place]) 04/07/2013   Renal calculi 04/07/2013   Adjustment disorder with mixed anxiety and depressed mood 04/07/2013    Otelia Limes, PTA 10/23/2020, 10:31 AM  [place], [place], [address] Phone: [phone]   Fax:  [phone]  Name: Ariane Ditullio MRN: [id number] Date of Birth: 06/10/1965

## 2020-11-08 DIAGNOSIS — J309 Allergic rhinitis, unspecified: Secondary | ICD-10-CM | POA: Insufficient documentation

## 2020-11-08 DIAGNOSIS — J45990 Exercise induced bronchospasm: Secondary | ICD-10-CM | POA: Insufficient documentation

## 2020-11-08 NOTE — Progress Notes (Signed)
SURVIVORSHIP VISIT:    BRIEF ONCOLOGIC HISTORY:  Oncology History  Malignant neoplasm of upper-outer quadrant of right breast in female, estrogen receptor positive ([place])  02/29/2020 Initial Diagnosis   Screening mammogram detected right breast mass. 0.6cm mass at the 10 o'clock position with multiple benign cysts. Biopsy on 02/29/20 showed invasive ductal carcinoma with DCIS, grade 1, HER-2 negative (1+), ER/PR >95%, Ki67: 5%.    03/21/2020 Cancer Staging   Staging form: Breast, AJCC 8th Edition - Clinical stage from 03/21/2020: Stage IA (cT1b, cN0, cM0, G1, ER+, PR+, HER2-) - Signed by Nicholas Lose, MD on 03/21/2020   04/16/2020 Genetic Testing   Negative genetic testing: no pathogenic variants detected in [facility] Multi-Cancer Panel.  The report date is April 16, 2020.    The Multi-Cancer Panel offered by [facility] includes sequencing and/or deletion duplication testing of the following 85 genes: AIP, ALK, APC, ATM, AXIN2,BAP1,  BARD1, BLM, BMPR1A, BRCA1, BRCA2, BRIP1, CASR, CDC73, CDH1, CDK4, CDKN1B, CDKN1C, CDKN2A (p14ARF), CDKN2A (p16INK4a), CEBPA, CHEK2, CTNNA1, DICER1, DIS3L2, EGFR (c.2369C>T, p.Thr790Met variant only), EPCAM (Deletion/duplication testing only), FH, FLCN, GATA2, GPC3, GREM1 (Promoter region deletion/duplication testing only), HOXB13 (c.251G>A, p.Gly84Glu), HRAS, KIT, MAX, MEN1, MET, MITF (c.952G>A, p.Glu318Lys variant only), MLH1, MSH2, MSH3, MSH6, MUTYH, NBN, NF1, NF2, NTHL1, PALB2, PDGFRA, PHOX2B, PMS2, POLD1, POLE, POT1, PRKAR1A, PTCH1, PTEN, RAD50, RAD51C, RAD51D, RB1, RECQL4, RET, RNF43, RUNX1, SDHAF2, SDHA (sequence changes only), SDHB, SDHC, SDHD, SMAD4, SMARCA4, SMARCB1, SMARCE1, STK11, SUFU, TERC, TERT, TMEM127, TP53, TSC1, TSC2, VHL, WRN and WT1.    04/20/2020 Surgery   Right lumpectomy [name]): invasive and in situ carcinoma, grade 1, 0.9cm, DCIS focally present at margin, 2 right axillary lymph nodes negative for carcinoma.   04/20/2020 Cancer Staging   Staging  form: Breast, AJCC 8th Edition - Pathologic stage from 04/20/2020: Stage IA (pT1b, pN0, cM0, G1, ER+, PR+, HER2-) - Signed by Gardenia Phlegm, NP on 11/06/2020 Stage prefix: Initial diagnosis Histologic grading system: 3 grade system   06/01/2020 Surgery   Re-excision of positive margin [name]): no malignancy identified    07/18/2020 -  Radiation Therapy   Adjuvant radiation   07/2020 -  Anti-estrogen oral therapy   Anastrozole daily     INTERVAL HISTORY:  Nicole Khan to review her survivorship care plan detailing her treatment course for breast cancer, as well as monitoring long-term side effects of that treatment, education regarding health maintenance, screening, and overall wellness and health promotion.     Overall, Nicole Khan reports feeling quite well.  She is taking several BP meds, and her BP is slightly decreased today.  She is not having any symptoms.  She is taking Anastrozole daily.  She is having some hot flashes and night sweats.  She notes these occur about 1-2 times a week.  These are manageable for her.  Her fatigue is improving day by day.  She notes shortness of breath with exercise due to exercise induced asthma.  She has an inhaler, but avoids higher intensity exercise due to this.  She notes that she has concerns about her weight.  She is working on changing her diet to lose weight and says she is slowly making progress.    REVIEW OF SYSTEMS:  Review of Systems  Constitutional:  Positive for fatigue (very mild and improving). Negative for appetite change, chills, fever and unexpected weight change.  HENT:   Negative for hearing loss, lump/mass and trouble swallowing.   Eyes:  Negative for eye problems and icterus.  Respiratory:  Negative  for chest tightness, cough and shortness of breath.   Cardiovascular:  Negative for chest pain, leg swelling and palpitations.  Gastrointestinal:  Negative for abdominal distention, abdominal pain, constipation, diarrhea, nausea  and vomiting.  Endocrine: Negative for hot flashes.  Genitourinary:  Negative for difficulty urinating.   Musculoskeletal:  Negative for arthralgias.  Skin:  Negative for itching and rash.  Neurological:  Negative for dizziness, extremity weakness, headaches and numbness.  Hematological:  Negative for adenopathy. Does not bruise/bleed easily.  Psychiatric/Behavioral:  Negative for depression. The patient is not nervous/anxious.   Breast: Denies any new nodularity, masses, tenderness, nipple changes, or nipple discharge.      ONCOLOGY TREATMENT TEAM:  1. Surgeon:  Dr. [name] at [facility] 2. Medical Oncologist: Dr. Lindi Adie  3. Radiation Oncologist: Dr. Lisbeth Renshaw    PAST MEDICAL/SURGICAL HISTORY:  Past Medical History:  Diagnosis Date   Abnormal mammogram of left breast 09/26/2017   Abnormal Pap smear of cervix    Asthma    exercise induced asthma   Diabetes mellitus without complication ([place])    type 2   Family history of breast cancer 04/11/2020   Family history of prostate cancer 04/11/2020   Family history of thyroid cancer 04/11/2020   Fracture dislocation of right ankle joint 06-04-2013   Fracture of distal fibula 06-04-2013   H/O bilateral salpingectomy 05/2015   History of kidney stones    passed stones, no surgery required   Hypercholesteremia    Polycystic ovary disease    PONV (postoperative nausea and vomiting)    Sleep apnea 05/2017   uses cpap   SVD (spontaneous vaginal delivery)    x 1   Past Surgical History:  Procedure Laterality Date   BREAST BIOPSY Right 02/29/2020   BREAST EXCISIONAL BIOPSY Left 2019   Benign   BREAST LUMPECTOMY WITH RADIOACTIVE SEED AND SENTINEL LYMPH NODE BIOPSY Right 04/20/2020   Procedure: RIGHT BREAST LUMPECTOMY WITH RADIOACTIVE SEED AND SENTINEL LYMPH NODE BIOPSY;  Surgeon: Jovita Kussmaul, MD;  Location: [facility];  Service: General;  Laterality: Right;   BREAST LUMPECTOMY WITH RADIOACTIVE SEED LOCALIZATION Left 09/26/2017    Procedure: LEFT BREAST LUMPECTOMY [place];  Surgeon: Fanny Skates, MD;  Location: [facility];  Service: General;  Laterality: Left;   CARPAL TUNNEL RELEASE     bilateral   CERVICAL CERCLAGE  07/1991; 05/1999   x 2   CESAREAN SECTION     x 1   CHOLECYSTECTOMY, LAPAROSCOPIC  1996   COLONOSCOPY WITH PROPOFOL N/A 06/24/2017   Procedure: COLONOSCOPY WITH PROPOFOL;  Surgeon: Juanita Craver, MD;  Location: [facility];  Service: Endoscopy;  Laterality: N/A;   CYSTOSCOPY N/A 06/07/2014   Procedure: CYSTOSCOPY;  Surgeon: Megan Salon, MD;  Location: [facility];  Service: Gynecology;  Laterality: N/A;   LAPAROSCOPIC BILATERAL SALPINGO OOPHERECTOMY Right 06/07/2014   Procedure: LAPAROSCOPIC RIGHT SALPINGO OOPHORECTOMY/COLLECTION OF PELVIC WASHINGS;  Surgeon: Megan Salon, MD;  Location: [facility];  Service: Gynecology;  Laterality: Right;   LAPAROSCOPIC UNILATERAL SALPINGECTOMY Left 06/07/2014   Procedure: LAPAROSCOPIC UNILATERAL SALPINGECTOMY;  Surgeon: Megan Salon, MD;  Location: [facility];  Service: Gynecology;  Laterality: Left;   ORIF ANKLE FRACTURE Right 06/07/2013   Procedure: OPEN REDUCTION INTERNAL FIXATION (ORIF) ANKLE FRACTURE  FIBULA SYNDESMOSIS;  Surgeon: Ninetta Lights, MD;  Location: [facility];  Service: Orthopedics;  Laterality: Right;   RE-EXCISION OF BREAST CANCER,SUPERIOR MARGINS Right 06/01/2020   Procedure: RIGHT BREAST  MARGIN REEXCISION;  Surgeon: Jovita Kussmaul, MD;  Location: [facility];  Service: General;  Laterality: Right;     ALLERGIES:  Allergies  Allergen Reactions   Soy Allergy Anaphylaxis and Other (See Comments)    Soy milk   Celery Oil Itching and Other (See Comments)    Causes itching in the mouth.   Eggs Or Egg-Derived Products Nausea Only   Hazel Tree Pollen [Corylus] Swelling and Other (See Comments)    Hazelnuts, makes patients mouth swell   Tape Hives    Skin loss with the use of steri strips      CURRENT MEDICATIONS:  Outpatient Encounter Medications as of 11/09/2020  Medication Sig   acetaminophen (TYLENOL) 500 MG tablet Take 1,000 mg by mouth every 6 (six) hours as needed for headache.   albuterol (PROVENTIL HFA;VENTOLIN HFA) 108 (90 Base) MCG/ACT inhaler Inhale 1 puff into the lungs every 6 (six) hours as needed for wheezing or shortness of breath.   anastrozole (ARIMIDEX) 1 MG tablet Take 1 tablet (1 mg total) by mouth daily.   atorvastatin (LIPITOR) 40 MG tablet Take 1 tablet (40 mg total) by mouth at bedtime.   azelastine (ASTELIN) 0.1 % nasal spray Place 1 spray into both nostrils every evening. Use in each nostril as directed   B Complex-C (B-COMPLEX WITH VITAMIN C) tablet Take 1 tablet by mouth every evening.   buPROPion (WELLBUTRIN XL) 300 MG 24 hr tablet TAKE 1 TABLET BY MOUTH DAILY EVERY MORNING   citalopram (CELEXA) 20 MG tablet Take 20 mg by mouth daily.   empagliflozin (JARDIANCE) 10 MG TABS tablet TAKE 1 TABLET BY MOUTH ONCE A DAY   EPINEPHrine 0.3 mg/0.3 mL IJ SOAJ injection Inject 0.3 mg into the muscle as needed for anaphylaxis.   JARDIANCE 10 MG TABS tablet Take 10 mg by mouth daily.   levocetirizine (XYZAL) 5 MG tablet Take 5 mg by mouth every evening.   metFORMIN (GLUCOPHAGE-XR) 500 MG 24 hr tablet Take 4 tablets (2,000 mg total) by mouth daily with a meal   metoprolol (LOPRESSOR) 100 MG tablet Take 100 mg by mouth 2 (two) times daily.   phenylephrine (SUDAFED PE) 10 MG TABS tablet Take 10 mg by mouth every 6 (six) hours as needed (for congestion).   spironolactone (ALDACTONE) 100 MG tablet Take 1 tablet (100 mg total) by mouth 2 (two) times daily.   TRULICITY 3 KK/9.3GH SOPN Inject 3 mg into the skin every Friday.   UNABLE TO FIND Immunotherapy Allergy Shot. Injection to each arm once weekly on various day   [DISCONTINUED] atorvastatin (LIPITOR) 40 MG tablet Take 40 mg by mouth at bedtime.    [DISCONTINUED] buPROPion (WELLBUTRIN XL) 300 MG 24 hr tablet Take  300 mg by mouth daily.   [DISCONTINUED] metFORMIN (GLUCOPHAGE-XR) 500 MG 24 hr tablet Take 2,000 mg by mouth daily.   No facility-administered encounter medications on file as of 11/09/2020.     ONCOLOGIC FAMILY HISTORY:  Family History  Problem Relation Age of Onset   Hypertension Mother    Infertility Mother    Deep vein thrombosis Mother    Anxiety disorder Mother    Depression Mother    Hepatitis C Mother        blood transfusion   Aneurysm Mother        Japanese   Cancer Father        Gallbladder; dx 1s   Hypertension Father    Anxiety disorder Father    Depression Father  Multiple births Sister    Thyroid disease Sister    Anxiety disorder Sister    Depression Sister    Breast cancer Sister 1   Thyroid cancer Sister 21       unknown type   Hypertension Brother    Anxiety disorder Brother    Depression Brother    Prostate cancer Paternal Uncle        dx 83s   Lung cancer Half-Sister 67       maternal half sister; no smoking hx     GENETIC COUNSELING/TESTING: See above  SOCIAL HISTORY:  Social History   Socioeconomic History   Marital status: Married    Spouse name: Not on file   Number of children: 2   Years of education: Not on file   Highest education level: Not on file  Occupational History   Not on file  Tobacco Use   Smoking status: Never   Smokeless tobacco: Never  Vaping Use   Vaping Use: Never used  Substance and Sexual Activity   Alcohol use: No    Alcohol/week: 0.0 standard drinks   Drug use: No   Sexual activity: Yes    Partners: Male    Birth control/protection: Post-menopausal    Comment: [employer]  Other Topics Concern   Not on file  Social History Narrative   Not on file   Social Determinants of Health   Financial Resource Strain: Low Risk    Difficulty of Paying Living Expenses: Not hard at all  Food Insecurity: No Food Insecurity   Worried About Charity fundraiser in the Last Year: Never true   [place] in  the Last Year: Never true  Transportation Needs: No Transportation Needs   Lack of Transportation (Medical): No   Lack of Transportation (Non-Medical): No  Physical Activity: Not on file  Stress: Not on file  Social Connections: Not on file  Intimate Partner Violence: Not on file     OBSERVATIONS/OBJECTIVE:  BP (!) 93/56 (BP Location: Left Arm, Patient Position: Sitting)   Pulse 86   Temp (!) 97.4 F (36.3 C) (Tympanic)   Resp 18   Ht 5' 1.5" (1.562 m)   Wt 185 lb 4.8 oz (84.1 kg)   LMP 12/31/2015 (Within Weeks)   SpO2 94%   BMI 34.45 kg/m  GENERAL: Patient is a well appearing female in no acute distress HEENT:  Sclerae anicteric.  Oropharynx clear and moist. No ulcerations or evidence of oropharyngeal candidiasis. Neck is supple.  NODES:  No cervical, supraclavicular, or axillary lymphadenopathy palpated.  BREAST EXAM:  right breast s/p lumpectomy and radiation, no sign of local recurrence, left breast benign LUNGS:  Clear to auscultation bilaterally.  No wheezes or rhonchi. HEART:  Regular rate and rhythm. No murmur appreciated. ABDOMEN:  Soft, nontender.  Positive, normoactive bowel sounds. No organomegaly palpated. MSK:  No focal spinal tenderness to palpation. Full range of motion bilaterally in the upper extremities. EXTREMITIES:  No peripheral edema.   SKIN:  Clear with no obvious rashes or skin changes. No nail dyscrasia. NEURO:  Nonfocal. Well oriented.  Appropriate affect.   LABORATORY DATA:  None for this visit.  DIAGNOSTIC IMAGING:  None for this visit.      ASSESSMENT AND PLAN:  Ms.. Khan is a pleasant 55 y.o. female with Stage IA right breast invasive ductal carcinoma, ER+/PR+/HER2-, diagnosed in 01/2019, treated with lumpectomy, adjuvant radiation therapy, and anti-estrogen therapy with Anastrozole beginning in 07/2020.  She presents to  the Survivorship Clinic for our initial meeting and routine follow-up post-completion of treatment for breast cancer.     1. Stage IA right breast cancer:  Nicole Khan is continuing to recover from definitive treatment for breast cancer. She will follow-up with her medical oncologist, Dr. Lindi Adie in 6 months with history and physical exam per surveillance protocol.  She will continue her anti-estrogen therapy with Anastrozole. Thus far, she is tolerating the Anastrozole well, with minimal side effects. She was instructed to make Dr. Lindi Adie or myself aware if she begins to experience any worsening side effects of the medication and I could see her back in clinic to help manage those side effects, as needed. Her mammogram is due 12/2020; orders placed today.   Today, a comprehensive survivorship care plan and treatment summary was reviewed with the patient today detailing her breast cancer diagnosis, treatment course, potential late/long-term effects of treatment, appropriate follow-up care with recommendations for the future, and patient education resources.  A copy of this summary, along with a letter will be sent to the patient's primary care provider via mail/fax/In Basket message after today's visit.    2.  Bone health:  Given Nicole Khan's age/history of breast cancer and her current treatment regimen including anti-estrogen therapy with Anastrozole, she is at risk for bone demineralization.  She has her bone density tested at [facility] with Dr. Buddy Duty.  I sent him a letter with her survivorship care plan and asked that he f/u with her about bone density testing. In the meantime, she was encouraged to increase her consumption of foods rich in calcium, as well as increase her weight-bearing activities.  She was given education on specific activities to promote bone health.  3. Cancer screening:  Due to Nicole Khan's history and her age, she should receive screening for skin cancers, colon cancer, and gynecologic cancers.  The information and recommendations are listed on the patient's comprehensive care  plan/treatment summary and were reviewed in detail with the patient.    4. Health maintenance and wellness promotion: Nicole Khan was encouraged to consume 5-7 servings of fruits and vegetables per day.  She was also encouraged to engage in moderate to vigorous exercise for 30 minutes per day most days of the week. We discussed the [employer] fitness program, which is designed for cancer survivors to help them become more physically fit after cancer treatments.  She was instructed to limit her alcohol consumption and continue to abstain from tobacco use.  She has not received the pneumonia vaccine, or the shingles vaccine.  I offered to vaccinate her for pneumonia today, however she declined since she is traveling to [place] tomorrow.  I sent Dr. Buddy Duty a message to see if he could follow up with her about this next month when he sees her.    5. Support services/counseling: It is not uncommon for this period of the patient's cancer care trajectory to be one of many emotions and stressors.   She was given information regarding our available services and encouraged to contact me with any questions or for help enrolling in any of our support group/programs.    Follow up instructions:    -Return to cancer center in 6 months for f/u with Dr. Lindi Adie  -Mammogram due in 12/2020 -Follow up with surgery in one year -Bone density with Dr. Buddy Duty -She is welcome to return back to the Survivorship Clinic at any time; no additional follow-up needed at this time.  -Consider referral back to survivorship  as a long-term survivor for continued surveillance  The patient was provided an opportunity to ask questions and all were answered. The patient agreed with the plan and demonstrated an understanding of the instructions.   Total encounter time: 55 minutes in face to face visit time, chart review, lab review, order entry, care coordination, and documentation of the encounter.   Wilber Bihari, NP 11/09/20 12:07  PM Medical Oncology and Hematology [facility] [place],  [address] Tel. [phone]    Fax. [phone]  *Total Encounter Time as defined by the Centers for Medicare and Medicaid Services includes, in addition to the face-to-face time of a patient visit (documented in the note above) non-face-to-face time: obtaining and reviewing outside history, ordering and reviewing medications, tests or procedures, care coordination (communications with other health care professionals or caregivers) and documentation in the medical record.

## 2020-11-09 ENCOUNTER — Inpatient Hospital Stay: Payer: 59 | Attending: Hematology and Oncology | Admitting: Adult Health

## 2020-11-09 ENCOUNTER — Other Ambulatory Visit: Payer: Self-pay

## 2020-11-09 ENCOUNTER — Encounter: Payer: Self-pay | Admitting: Adult Health

## 2020-11-09 VITALS — BP 93/56 | HR 86 | Temp 97.4°F | Resp 18 | Ht 61.5 in | Wt 185.3 lb

## 2020-11-09 DIAGNOSIS — R61 Generalized hyperhidrosis: Secondary | ICD-10-CM | POA: Insufficient documentation

## 2020-11-09 DIAGNOSIS — Z79811 Long term (current) use of aromatase inhibitors: Secondary | ICD-10-CM | POA: Insufficient documentation

## 2020-11-09 DIAGNOSIS — C50411 Malignant neoplasm of upper-outer quadrant of right female breast: Secondary | ICD-10-CM | POA: Diagnosis not present

## 2020-11-09 DIAGNOSIS — Z79899 Other long term (current) drug therapy: Secondary | ICD-10-CM | POA: Insufficient documentation

## 2020-11-09 DIAGNOSIS — Z170 Estrogen receptor positive status [ER+]: Secondary | ICD-10-CM | POA: Diagnosis not present

## 2020-11-09 DIAGNOSIS — R232 Flushing: Secondary | ICD-10-CM | POA: Insufficient documentation

## 2020-11-13 ENCOUNTER — Telehealth: Payer: Self-pay | Admitting: Adult Health

## 2020-11-13 NOTE — Telephone Encounter (Signed)
Scheduled appointment per 08/15 los. Left message.

## 2020-11-28 ENCOUNTER — Other Ambulatory Visit (HOSPITAL_COMMUNITY): Payer: Self-pay

## 2020-11-30 ENCOUNTER — Other Ambulatory Visit (HOSPITAL_COMMUNITY): Payer: Self-pay

## 2020-12-01 ENCOUNTER — Other Ambulatory Visit (HOSPITAL_COMMUNITY): Payer: Self-pay

## 2020-12-01 MED ORDER — TRULICITY 3 MG/0.5ML ~~LOC~~ SOAJ
3.0000 mg | SUBCUTANEOUS | 11 refills | Status: DC
  Filled 2020-12-01: qty 6, 84d supply, fill #0
  Filled 2021-03-21 – 2021-04-04 (×2): qty 2, 28d supply, fill #1
  Filled 2021-05-23: qty 2, 28d supply, fill #2
  Filled 2021-06-20 – 2021-06-29 (×2): qty 2, 28d supply, fill #3
  Filled 2021-08-08: qty 2, 28d supply, fill #4
  Filled 2021-09-10: qty 2, 28d supply, fill #5
  Filled 2021-11-01: qty 2, 28d supply, fill #6

## 2020-12-05 DIAGNOSIS — N951 Menopausal and female climacteric states: Secondary | ICD-10-CM | POA: Diagnosis not present

## 2020-12-05 DIAGNOSIS — E282 Polycystic ovarian syndrome: Secondary | ICD-10-CM | POA: Diagnosis not present

## 2020-12-05 DIAGNOSIS — Z5181 Encounter for therapeutic drug level monitoring: Secondary | ICD-10-CM | POA: Diagnosis not present

## 2020-12-05 DIAGNOSIS — E119 Type 2 diabetes mellitus without complications: Secondary | ICD-10-CM | POA: Diagnosis not present

## 2020-12-05 DIAGNOSIS — Z13820 Encounter for screening for osteoporosis: Secondary | ICD-10-CM | POA: Diagnosis not present

## 2020-12-05 DIAGNOSIS — Z7984 Long term (current) use of oral hypoglycemic drugs: Secondary | ICD-10-CM | POA: Diagnosis not present

## 2020-12-11 DIAGNOSIS — J45990 Exercise induced bronchospasm: Secondary | ICD-10-CM | POA: Diagnosis not present

## 2020-12-11 DIAGNOSIS — J301 Allergic rhinitis due to pollen: Secondary | ICD-10-CM | POA: Diagnosis not present

## 2020-12-11 DIAGNOSIS — Z91018 Allergy to other foods: Secondary | ICD-10-CM | POA: Diagnosis not present

## 2020-12-11 DIAGNOSIS — J3089 Other allergic rhinitis: Secondary | ICD-10-CM | POA: Diagnosis not present

## 2020-12-21 ENCOUNTER — Other Ambulatory Visit (HOSPITAL_COMMUNITY): Payer: Self-pay

## 2020-12-22 ENCOUNTER — Other Ambulatory Visit (HOSPITAL_COMMUNITY): Payer: Self-pay

## 2020-12-22 MED ORDER — METOPROLOL TARTRATE 100 MG PO TABS
100.0000 mg | ORAL_TABLET | Freq: Two times a day (BID) | ORAL | 1 refills | Status: DC
  Filled 2020-12-22: qty 180, 90d supply, fill #0
  Filled 2021-03-28: qty 180, 90d supply, fill #1

## 2020-12-25 ENCOUNTER — Other Ambulatory Visit (HOSPITAL_COMMUNITY): Payer: Self-pay

## 2020-12-26 ENCOUNTER — Other Ambulatory Visit (HOSPITAL_COMMUNITY): Payer: Self-pay

## 2020-12-26 MED FILL — Citalopram Hydrobromide Tab 20 MG (Base Equiv): ORAL | 90 days supply | Qty: 90 | Fill #1 | Status: AC

## 2020-12-27 ENCOUNTER — Telehealth: Payer: Self-pay

## 2020-12-27 NOTE — Telephone Encounter (Signed)
Patient called to make [place] aware of issues with her med list. Patient said evidently 2 meds that were duplicates were removed from her med list at her 09/21/20 visit by A. Dixon. These were meds prescribed by another MD. When this happened they were canceled at the [facility].  When patient went to refill them the pharmacy said Rx had been cancelled.  She had to call her other MD's and have them re-prescribe them. She said the exact same thing happened to daughter.  I thanked her for letting us know and told her I would make clinic manager aware. Apologized for the inconvenience.

## 2020-12-27 NOTE — Telephone Encounter (Signed)
Encounter reviewed.   Encounter closed.

## 2021-01-09 ENCOUNTER — Other Ambulatory Visit: Payer: Self-pay | Admitting: Obstetrics and Gynecology

## 2021-01-09 DIAGNOSIS — I10 Essential (primary) hypertension: Secondary | ICD-10-CM

## 2021-01-10 ENCOUNTER — Other Ambulatory Visit (HOSPITAL_COMMUNITY): Payer: Self-pay

## 2021-01-10 DIAGNOSIS — L918 Other hypertrophic disorders of the skin: Secondary | ICD-10-CM | POA: Diagnosis not present

## 2021-01-10 DIAGNOSIS — L821 Other seborrheic keratosis: Secondary | ICD-10-CM | POA: Diagnosis not present

## 2021-01-10 DIAGNOSIS — D3610 Benign neoplasm of peripheral nerves and autonomic nervous system, unspecified: Secondary | ICD-10-CM | POA: Diagnosis not present

## 2021-01-10 NOTE — Telephone Encounter (Signed)
Rx request received for Spironolactone 100mg  tab BID.   Last sent 09/21/20 #180/0RF Per AEX note, Patient to discuss with ONC at 10/2020 visit.   Call placed to patient. Patient did see ONC 11/09/20, states no concerns, ok to continue taking. Patient request refill to verified pharmacy. Advised I will forward to Dr. Quincy Simmonds to review, our office will notify if any additional recommendations, otherwise f/u with pharmacy for refill.   Routing to Dr. Quincy Simmonds

## 2021-01-11 ENCOUNTER — Other Ambulatory Visit (HOSPITAL_COMMUNITY): Payer: Self-pay

## 2021-01-11 ENCOUNTER — Other Ambulatory Visit: Payer: Self-pay | Admitting: Obstetrics and Gynecology

## 2021-01-11 DIAGNOSIS — I10 Essential (primary) hypertension: Secondary | ICD-10-CM

## 2021-01-11 NOTE — Telephone Encounter (Signed)
See [place] telephone encounter dated 01/09/21. Pending Dr. Quincy Simmonds review.

## 2021-01-12 ENCOUNTER — Other Ambulatory Visit (HOSPITAL_COMMUNITY): Payer: Self-pay

## 2021-01-16 ENCOUNTER — Other Ambulatory Visit: Payer: Self-pay | Admitting: Obstetrics and Gynecology

## 2021-01-16 ENCOUNTER — Other Ambulatory Visit (HOSPITAL_COMMUNITY): Payer: Self-pay

## 2021-01-16 DIAGNOSIS — I10 Essential (primary) hypertension: Secondary | ICD-10-CM

## 2021-01-16 MED FILL — Empagliflozin Tab 10 MG: ORAL | 90 days supply | Qty: 90 | Fill #2 | Status: AC

## 2021-01-17 ENCOUNTER — Other Ambulatory Visit (HOSPITAL_COMMUNITY): Payer: Self-pay

## 2021-01-17 MED ORDER — SPIRONOLACTONE 100 MG PO TABS
100.0000 mg | ORAL_TABLET | Freq: Two times a day (BID) | ORAL | 2 refills | Status: DC
  Filled 2021-01-17 – 2021-01-31 (×2): qty 180, 90d supply, fill #0
  Filled 2021-05-04: qty 180, 90d supply, fill #1
  Filled 2021-08-08: qty 180, 90d supply, fill #2

## 2021-01-17 NOTE — Telephone Encounter (Signed)
Spironolactone rx approved until annual exam is due.  I am prescribing Spironolactone to treat acne and hirsutism and not her HTN although it can be used to treat this as well. No interaction with her Arimidex.

## 2021-01-17 NOTE — Telephone Encounter (Signed)
RX refill request Spirolactone Last annual 09/21/2020 Next scheduled annual 09/24/2021 I will route to Provider for approval/denial

## 2021-01-18 ENCOUNTER — Other Ambulatory Visit (HOSPITAL_COMMUNITY): Payer: Self-pay

## 2021-01-22 ENCOUNTER — Other Ambulatory Visit: Payer: Self-pay

## 2021-01-22 ENCOUNTER — Ambulatory Visit: Payer: 59 | Attending: Hematology and Oncology

## 2021-01-22 VITALS — Wt 189.2 lb

## 2021-01-22 DIAGNOSIS — Z483 Aftercare following surgery for neoplasm: Secondary | ICD-10-CM | POA: Insufficient documentation

## 2021-01-22 NOTE — Therapy (Signed)
[facility] @ [place], [place], [address] Phone: [phone]   Fax:  [phone]  Physical Therapy Treatment  Patient Details  Name: Nicole Khan MRN: [id number] Date of Birth: [birth year] Referring Provider (PT): Dr. [name]   Encounter Date: 01/22/2021   PT End of Session - 01/22/21 1429     Visit Number 4   # unchanged due to screen only   PT Start Time 0102    PT Stop Time 7253    PT Time Calculation (min) 5 min    Activity Tolerance Patient tolerated treatment well    Behavior During Therapy [facility] for tasks assessed/performed             Past Medical History:  Diagnosis Date   Abnormal mammogram of left breast 09/26/2017   Abnormal Pap smear of cervix    Asthma    exercise induced asthma   Diabetes mellitus without complication ([place])    type 2   Family history of breast cancer 04/11/2020   Family history of prostate cancer 04/11/2020   Family history of thyroid cancer 04/11/2020   Fracture dislocation of right ankle joint 06-04-2013   Fracture of distal fibula 06-04-2013   H/O bilateral salpingectomy 05/2015   History of kidney stones    passed stones, no surgery required   Hypercholesteremia    Polycystic ovary disease    PONV (postoperative nausea and vomiting)    Sleep apnea 05/2017   uses cpap   SVD (spontaneous vaginal delivery)    x 1    Past Surgical History:  Procedure Laterality Date   BREAST BIOPSY Right 02/29/2020   BREAST EXCISIONAL BIOPSY Left 2019   Benign   BREAST LUMPECTOMY WITH RADIOACTIVE SEED AND SENTINEL LYMPH NODE BIOPSY Right 04/20/2020   Procedure: RIGHT BREAST LUMPECTOMY WITH RADIOACTIVE SEED AND SENTINEL LYMPH NODE BIOPSY;  Surgeon: Jovita Kussmaul, MD;  Location: [place];  Service: General;  Laterality: Right;   BREAST LUMPECTOMY WITH RADIOACTIVE SEED LOCALIZATION Left 09/26/2017   Procedure: LEFT BREAST LUMPECTOMY [place];  Surgeon: Fanny Skates, MD;  Location: [place];  Service: General;  Laterality: Left;   CARPAL TUNNEL RELEASE     bilateral   CERVICAL CERCLAGE  07/1991; 05/1999   x 2   CESAREAN SECTION     x 1   CHOLECYSTECTOMY, LAPAROSCOPIC  1996   COLONOSCOPY WITH PROPOFOL N/A 06/24/2017   Procedure: COLONOSCOPY WITH PROPOFOL;  Surgeon: Juanita Craver, MD;  Location: [facility];  Service: Endoscopy;  Laterality: N/A;   CYSTOSCOPY N/A 06/07/2014   Procedure: CYSTOSCOPY;  Surgeon: Megan Salon, MD;  Location: [facility];  Service: Gynecology;  Laterality: N/A;   LAPAROSCOPIC BILATERAL SALPINGO OOPHERECTOMY Right 06/07/2014   Procedure: LAPAROSCOPIC RIGHT SALPINGO OOPHORECTOMY/COLLECTION OF PELVIC WASHINGS;  Surgeon: Megan Salon, MD;  Location: [facility];  Service: Gynecology;  Laterality: Right;   LAPAROSCOPIC UNILATERAL SALPINGECTOMY Left 06/07/2014   Procedure: LAPAROSCOPIC UNILATERAL SALPINGECTOMY;  Surgeon: Megan Salon, MD;  Location: [facility];  Service: Gynecology;  Laterality: Left;   ORIF ANKLE FRACTURE Right 06/07/2013   Procedure: OPEN REDUCTION INTERNAL FIXATION (ORIF) ANKLE FRACTURE  FIBULA SYNDESMOSIS;  Surgeon: Ninetta Lights, MD;  Location: [facility];  Service: Orthopedics;  Laterality: Right;   RE-EXCISION OF BREAST CANCER,SUPERIOR MARGINS Right 06/01/2020   Procedure: RIGHT BREAST MARGIN REEXCISION;  Surgeon: Jovita Kussmaul, MD;  Location: [facility];  Service: General;  Laterality: Right;    Vitals:   01/22/21 1427  Weight: 189 lb 4 oz (85.8 kg)     Subjective Assessment - 01/22/21 1429     Subjective Pt returns for her 3 month L-Dex screen.    Pertinent History right breast cancer diagnosed with bio Pt had reexcision on June 01, 2020.  biopsy on 02/29/20 showed invasive ductal carcinoma with DCIS, grade 1, HER-2 negative (1+), ER/PR >95%, Ki 67 5%. Surgery is scheduled for Jan 20 and she will have radiation.  Past history includes, DM, sleep apnea, bilaterl  carpal tunnel surgery( 2006) and right trigger finger surgery ( 2011)                    L-DEX FLOWSHEETS - 01/22/21 1400       L-DEX LYMPHEDEMA SCREENING   Measurement Type Unilateral    L-DEX MEASUREMENT EXTREMITY Upper Extremity    POSITION  Standing    DOMINANT SIDE Right    At Risk Side Right    BASELINE SCORE (UNILATERAL) 1.8    L-DEX SCORE (UNILATERAL) 2.6    VALUE CHANGE (UNILAT) 0.8                                     PT Long Term Goals - 07/04/20 1420       PT LONG TERM GOAL #1   Title Pt will be independent in self MLD and scar massage to right breast    Time 4    Period Weeks    Target Date 08/01/20      PT LONG TERM GOAL #2   Title Pt will have decreased fibrosis/swelling at right breast    Time 6    Period Weeks    Target Date 08/01/20      PT LONG TERM GOAL #3   Title Pt will be fit for compression sleeve as indicated    Time 6    Period Weeks    Status On-going    Target Date 08/01/20                   Plan - 01/22/21 1432     Clinical Impression Statement Pt returns for her 3 month L-Dex screen. Her change from baseline of 0.8 is WNLs so no further treatment is required at this time except to cont every 3 month L-Dex screens which pt is agreeable to.    PT Next Visit Plan Cont every 3 month L-Dex screens for up to 2 years from her SLNB (~04/20/22)    Consulted and Agree with Plan of Care Patient             Patient will benefit from skilled therapeutic intervention in order to improve the following deficits and impairments:     Visit Diagnosis: Aftercare following surgery for neoplasm     Problem List Patient Active Problem List   Diagnosis Date Noted   Allergic rhinitis 11/08/2020   Exercise induced bronchospasm 11/08/2020   Genetic testing 04/17/2020   Family history of breast cancer 04/11/2020   Family history of prostate cancer 04/11/2020   Family history of thyroid cancer  04/11/2020   Malignant neoplasm of upper-outer quadrant of right breast in female, estrogen receptor positive ([place]) 03/21/2020   Abnormal mammogram of left breast 09/26/2017   Bilateral ovarian cysts 05/23/2014   Fracture of distal fibula 06/04/2013   HTN (hypertension) 04/07/2013  PCOS (polycystic ovarian syndrome) 04/07/2013   Renal calculi 04/07/2013   Adjustment disorder with mixed anxiety and depressed mood 04/07/2013    Otelia Limes, PTA 01/22/2021, 2:34 PM  [facility] @ [place], [place], [address] Phone: [phone]   Fax:  [phone]  Name: Nicole Khan MRN: [id number] Date of Birth: 12-Apr-1965

## 2021-01-26 ENCOUNTER — Other Ambulatory Visit (HOSPITAL_COMMUNITY): Payer: Self-pay

## 2021-01-29 ENCOUNTER — Other Ambulatory Visit (HOSPITAL_COMMUNITY): Payer: Self-pay

## 2021-01-29 ENCOUNTER — Ambulatory Visit
Admission: RE | Admit: 2021-01-29 | Discharge: 2021-01-29 | Disposition: A | Discharge status: Home / Self Care | Payer: 59 | Source: Ambulatory Visit | Attending: Adult Health | Admitting: Adult Health

## 2021-01-29 ENCOUNTER — Other Ambulatory Visit: Payer: Self-pay | Admitting: Adult Health

## 2021-01-29 ENCOUNTER — Other Ambulatory Visit: Payer: Self-pay

## 2021-01-29 DIAGNOSIS — N6342 Unspecified lump in left breast, subareolar: Secondary | ICD-10-CM

## 2021-01-29 DIAGNOSIS — Z170 Estrogen receptor positive status [ER+]: Secondary | ICD-10-CM

## 2021-01-29 DIAGNOSIS — C50411 Malignant neoplasm of upper-outer quadrant of right female breast: Secondary | ICD-10-CM

## 2021-01-29 DIAGNOSIS — N6489 Other specified disorders of breast: Secondary | ICD-10-CM | POA: Diagnosis not present

## 2021-01-29 DIAGNOSIS — R921 Mammographic calcification found on diagnostic imaging of breast: Secondary | ICD-10-CM | POA: Diagnosis not present

## 2021-01-29 HISTORY — DX: Personal history of irradiation: Z92.3

## 2021-01-30 HISTORY — PX: TRIGGER FINGER RELEASE: SHX641

## 2021-01-31 ENCOUNTER — Other Ambulatory Visit (HOSPITAL_COMMUNITY): Payer: Self-pay

## 2021-01-31 DIAGNOSIS — M79644 Pain in right finger(s): Secondary | ICD-10-CM | POA: Diagnosis not present

## 2021-02-01 ENCOUNTER — Encounter: Payer: Self-pay | Admitting: Hematology and Oncology

## 2021-02-01 NOTE — Progress Notes (Signed)
Received fax from [facility] regarding pt surgery clearance.  Form filled out by provider and successfully faxed back.

## 2021-02-07 ENCOUNTER — Ambulatory Visit
Admission: RE | Admit: 2021-02-07 | Discharge: 2021-02-07 | Disposition: A | Discharge status: Home / Self Care | Payer: 59 | Source: Ambulatory Visit | Attending: Adult Health | Admitting: Adult Health

## 2021-02-07 DIAGNOSIS — N6489 Other specified disorders of breast: Secondary | ICD-10-CM | POA: Diagnosis not present

## 2021-02-07 DIAGNOSIS — Z170 Estrogen receptor positive status [ER+]: Secondary | ICD-10-CM

## 2021-02-07 DIAGNOSIS — C50411 Malignant neoplasm of upper-outer quadrant of right female breast: Secondary | ICD-10-CM

## 2021-02-07 DIAGNOSIS — R921 Mammographic calcification found on diagnostic imaging of breast: Secondary | ICD-10-CM | POA: Diagnosis not present

## 2021-02-15 ENCOUNTER — Other Ambulatory Visit (HOSPITAL_COMMUNITY): Payer: Self-pay

## 2021-02-15 DIAGNOSIS — M65331 Trigger finger, right middle finger: Secondary | ICD-10-CM | POA: Diagnosis not present

## 2021-02-15 DIAGNOSIS — M65341 Trigger finger, right ring finger: Secondary | ICD-10-CM | POA: Diagnosis not present

## 2021-02-15 MED ORDER — HYDROCODONE-ACETAMINOPHEN 10-325 MG PO TABS
1.0000 | ORAL_TABLET | Freq: Four times a day (QID) | ORAL | 0 refills | Status: DC | PRN
  Filled 2021-02-15: qty 28, 7d supply, fill #0

## 2021-02-15 MED ORDER — MELOXICAM 15 MG PO TABS
15.0000 mg | ORAL_TABLET | Freq: Every day | ORAL | 0 refills | Status: DC | PRN
  Filled 2021-02-15: qty 30, 30d supply, fill #0

## 2021-02-26 ENCOUNTER — Other Ambulatory Visit (HOSPITAL_COMMUNITY): Payer: Self-pay

## 2021-02-26 DIAGNOSIS — M65331 Trigger finger, right middle finger: Secondary | ICD-10-CM | POA: Diagnosis not present

## 2021-02-26 MED ORDER — METHYLPREDNISOLONE 4 MG PO TBPK
ORAL_TABLET | ORAL | 0 refills | Status: DC
  Filled 2021-02-26: qty 21, 6d supply, fill #0

## 2021-02-28 ENCOUNTER — Ambulatory Visit: Payer: Self-pay | Admitting: General Surgery

## 2021-02-28 ENCOUNTER — Other Ambulatory Visit (HOSPITAL_COMMUNITY): Payer: Self-pay

## 2021-02-28 DIAGNOSIS — N6489 Other specified disorders of breast: Secondary | ICD-10-CM

## 2021-02-28 MED ORDER — DOXYCYCLINE MONOHYDRATE 100 MG PO CAPS
100.0000 mg | ORAL_CAPSULE | Freq: Two times a day (BID) | ORAL | 1 refills | Status: DC
  Filled 2021-02-28: qty 14, 7d supply, fill #0

## 2021-03-01 DIAGNOSIS — M25649 Stiffness of unspecified hand, not elsewhere classified: Secondary | ICD-10-CM | POA: Diagnosis not present

## 2021-03-01 DIAGNOSIS — M6530 Trigger finger, unspecified finger: Secondary | ICD-10-CM | POA: Diagnosis not present

## 2021-03-02 ENCOUNTER — Other Ambulatory Visit: Payer: Self-pay | Admitting: General Surgery

## 2021-03-02 DIAGNOSIS — N6489 Other specified disorders of breast: Secondary | ICD-10-CM

## 2021-03-05 DIAGNOSIS — M6530 Trigger finger, unspecified finger: Secondary | ICD-10-CM | POA: Diagnosis not present

## 2021-03-05 DIAGNOSIS — M25649 Stiffness of unspecified hand, not elsewhere classified: Secondary | ICD-10-CM | POA: Diagnosis not present

## 2021-03-08 DIAGNOSIS — M6530 Trigger finger, unspecified finger: Secondary | ICD-10-CM | POA: Diagnosis not present

## 2021-03-08 DIAGNOSIS — M25649 Stiffness of unspecified hand, not elsewhere classified: Secondary | ICD-10-CM | POA: Diagnosis not present

## 2021-03-13 DIAGNOSIS — M6530 Trigger finger, unspecified finger: Secondary | ICD-10-CM | POA: Diagnosis not present

## 2021-03-13 DIAGNOSIS — M25649 Stiffness of unspecified hand, not elsewhere classified: Secondary | ICD-10-CM | POA: Diagnosis not present

## 2021-03-15 DIAGNOSIS — M25649 Stiffness of unspecified hand, not elsewhere classified: Secondary | ICD-10-CM | POA: Diagnosis not present

## 2021-03-15 DIAGNOSIS — M6530 Trigger finger, unspecified finger: Secondary | ICD-10-CM | POA: Diagnosis not present

## 2021-03-21 DIAGNOSIS — M6530 Trigger finger, unspecified finger: Secondary | ICD-10-CM | POA: Diagnosis not present

## 2021-03-21 DIAGNOSIS — M25649 Stiffness of unspecified hand, not elsewhere classified: Secondary | ICD-10-CM | POA: Diagnosis not present

## 2021-03-21 NOTE — Progress Notes (Signed)
Surgical Instructions    Your procedure is scheduled on Thursday, December 29th, 2022.   Report to [facility] Main Entrance "A" at 09:30 A.M., then check in with the Admitting office.  Call this number if you have problems the morning of surgery:  [phone]   If you have any questions prior to your surgery date call [phone]: Open Monday-Friday 8am-4pm    Remember:  Do not eat after midnight the night before your surgery  You may drink clear liquids until 08:30 the morning of your surgery.   Clear liquids allowed are: Water, Non-Citrus Juices (without pulp), Carbonated Beverages, Clear Tea, Black Coffee ONLY (NO MILK, CREAM OR POWDERED CREAMER of any kind), and Gatorade    Take these medicines the morning of surgery with A SIP OF WATER:  buPROPion (WELLBUTRIN XL)  citalopram (CELEXA)  metoprolol tartrate (LOPRESSOR)   If needed:   acetaminophen (TYLENOL) phenylephrine (SUDAFED PE)  Ask the doctor if you need to hold anastrozole (ARIMIDEX) before surgery  As of today, STOP taking any Aspirin (unless otherwise instructed by your surgeon) Aleve, Naproxen, Ibuprofen, Motrin, Advil, Goody's, BC's, all herbal medications, fish oil, and all vitamins.   WHAT DO I DO ABOUT MY DIABETES MEDICATION?   Do not take Dulaglutide (TRULICITY) the day of surgery  Do not take metFORMIN (GLUCOPHAGE-XR) the day of surgery  Hold empagliflozin (JARDIANCE) the day before surgery and the day of surgery   HOW TO MANAGE YOUR DIABETES BEFORE AND AFTER SURGERY  Why is it important to control my blood sugar before and after surgery? Improving blood sugar levels before and after surgery helps healing and can limit problems. A way of improving blood sugar control is eating a healthy diet by:  Eating less sugar and carbohydrates  Increasing activity/exercise  Talking with your doctor about reaching your blood sugar goals High blood sugars (greater than 180 mg/dL) can raise your risk of  infections and slow your recovery, so you will need to focus on controlling your diabetes during the weeks before surgery. Make sure that the doctor who takes care of your diabetes knows about your planned surgery including the date and location.  How do I manage my blood sugar before surgery? Check your blood sugar at least 4 times a day, starting 2 days before surgery, to make sure that the level is not too high or low.  Check your blood sugar the morning of your surgery when you wake up and every 2 hours until you get to the Short Stay unit.  If your blood sugar is less than 70 mg/dL, you will need to treat for low blood sugar: Do not take insulin. Treat a low blood sugar (less than 70 mg/dL) with  cup of clear juice (cranberry or apple), 4 glucose tablets, OR glucose gel. Recheck blood sugar in 15 minutes after treatment (to make sure it is greater than 70 mg/dL). If your blood sugar is not greater than 70 mg/dL on recheck, call [phone] for further instructions. Report your blood sugar to the short stay nurse when you get to Short Stay.  If you are admitted to the hospital after surgery: Your blood sugar will be checked by the staff and you will probably be given insulin after surgery (instead of oral diabetes medicines) to make sure you have good blood sugar levels. The goal for blood sugar control after surgery is 80-180 mg/dL.    After your COVID test   You are not required to quarantine however you are  required to wear a well-fitting mask when you are out and around people not in your household.  If your mask becomes wet or soiled, replace with a new one.  Wash your hands often with soap and water for 20 seconds or clean your hands with an alcohol-based hand sanitizer that contains at least 60% alcohol.  Do not share personal items.  Notify your provider: if you are in close contact with someone who has COVID  or if you develop a fever of 100.4 or greater, sneezing, cough,  sore throat, shortness of breath or body aches.   The day of surgery:          Do not wear jewelry or makeup Do not wear lotions, powders, perfumes or deodorant. Do not shave 48 hours prior to surgery.   Do not bring valuables to the hospital. DO Not wear nail polish, gel polish, artificial nails, or any other type of covering on natural nails including finger and toenails. If patients have artificial nails, gel coating, etc. that need to be removed by a nail salon, please have this removed prior to surgery or surgery may need to be canceled/delayed if the surgeon/ anesthesia feels like the patient is unable to be adequately monitored.              [facility] is not responsible for any belongings or valuables.  Do NOT Smoke (Tobacco/Vaping)  24 hours prior to your procedure  If you use a CPAP at night, you may bring your mask for your overnight stay.   Contacts, glasses, hearing aids, dentures or partials may not be worn into surgery, please bring cases for these belongings   For patients admitted to the hospital, discharge time will be determined by your treatment team.   Patients discharged the day of surgery will not be allowed to drive home, and someone needs to stay with them for 24 hours.  NO VISITORS WILL BE ALLOWED IN PRE-OP WHERE PATIENTS ARE PREPPED FOR SURGERY.  ONLY 1 SUPPORT PERSON MAY BE PRESENT IN THE WAITING ROOM WHILE YOU ARE IN SURGERY.  IF YOU ARE TO BE ADMITTED, ONCE YOU ARE IN YOUR ROOM YOU WILL BE ALLOWED TWO (2) VISITORS. 1 (ONE) VISITOR MAY STAY OVERNIGHT BUT MUST ARRIVE TO THE ROOM BY 8pm.  Minor children may have two parents present. Special consideration for safety and communication needs will be reviewed on a case by case basis.  Special instructions:    Oral Hygiene is also important to reduce your risk of infection.  Remember - BRUSH YOUR TEETH THE MORNING OF SURGERY WITH YOUR REGULAR TOOTHPASTE   - Preparing For Surgery  Before surgery, you  can play an important role. Because skin is not sterile, your skin needs to be as free of germs as possible. You can reduce the number of germs on your skin by washing with CHG (chlorahexidine gluconate) Soap before surgery.  CHG is an antiseptic cleaner which kills germs and bonds with the skin to continue killing germs even after washing.     Please do not use if you have an allergy to CHG or antibacterial soaps. If your skin becomes reddened/irritated stop using the CHG.  Do not shave (including legs and underarms) for at least 48 hours prior to first CHG shower. It is OK to shave your face.  Please follow these instructions carefully.     Shower the NIGHT BEFORE SURGERY and the MORNING OF SURGERY with CHG Soap.   If you  chose to wash your hair, wash your hair first as usual with your normal shampoo. After you shampoo, rinse your hair and body thoroughly to remove the shampoo.  Then [employer] and genitals (private parts) with your normal soap and rinse thoroughly to remove soap.  After that Use CHG Soap as you would any other liquid soap. You can apply CHG directly to the skin and wash gently with a scrungie or a clean washcloth.   Apply the CHG Soap to your body ONLY FROM THE NECK DOWN.  Do not use on open wounds or open sores. Avoid contact with your eyes, ears, mouth and genitals (private parts). Wash Face and genitals (private parts)  with your normal soap.   Wash thoroughly, paying special attention to the area where your surgery will be performed.  Thoroughly rinse your body with warm water from the neck down.  DO NOT shower/wash with your normal soap after using and rinsing off the CHG Soap.  Pat yourself dry with a CLEAN TOWEL.  Wear CLEAN PAJAMAS to bed the night before surgery  Place CLEAN SHEETS on your bed the night before your surgery  DO NOT SLEEP WITH PETS.   Day of Surgery:  Take a shower with CHG soap. Wear Clean/Comfortable clothing the morning of surgery Do  not apply any deodorants/lotions.   Remember to brush your teeth WITH YOUR REGULAR TOOTHPASTE.   Please read over the following fact sheets that you were given.

## 2021-03-22 ENCOUNTER — Other Ambulatory Visit (HOSPITAL_COMMUNITY): Payer: Self-pay

## 2021-03-22 ENCOUNTER — Other Ambulatory Visit: Payer: Self-pay

## 2021-03-22 ENCOUNTER — Encounter (HOSPITAL_COMMUNITY): Payer: Self-pay

## 2021-03-22 ENCOUNTER — Other Ambulatory Visit (HOSPITAL_COMMUNITY): Payer: 59

## 2021-03-22 ENCOUNTER — Encounter (HOSPITAL_COMMUNITY)
Admission: RE | Admit: 2021-03-22 | Discharge: 2021-03-22 | Disposition: A | Discharge status: Home / Self Care | Payer: 59 | Source: Ambulatory Visit | Attending: General Surgery | Admitting: General Surgery

## 2021-03-22 VITALS — BP 104/69 | HR 91 | Temp 98.4°F | Resp 18 | Ht 61.0 in | Wt 182.3 lb

## 2021-03-22 DIAGNOSIS — Z794 Long term (current) use of insulin: Secondary | ICD-10-CM | POA: Diagnosis not present

## 2021-03-22 DIAGNOSIS — I2510 Atherosclerotic heart disease of native coronary artery without angina pectoris: Secondary | ICD-10-CM | POA: Insufficient documentation

## 2021-03-22 DIAGNOSIS — E119 Type 2 diabetes mellitus without complications: Secondary | ICD-10-CM | POA: Insufficient documentation

## 2021-03-22 DIAGNOSIS — Z01812 Encounter for preprocedural laboratory examination: Secondary | ICD-10-CM | POA: Diagnosis not present

## 2021-03-22 DIAGNOSIS — Z01818 Encounter for other preprocedural examination: Secondary | ICD-10-CM

## 2021-03-22 LAB — BASIC METABOLIC PANEL
Anion gap: 11 (ref 5–15)
BUN: 14 mg/dL (ref 6–20)
CO2: 24 mmol/L (ref 22–32)
Calcium: 9.8 mg/dL (ref 8.9–10.3)
Chloride: 106 mmol/L (ref 98–111)
Creatinine, Ser: 1.09 mg/dL — ABNORMAL HIGH (ref 0.44–1.00)
GFR, Estimated: >60 mL/min (ref >60)
Glucose, Bld: 118 mg/dL — ABNORMAL HIGH (ref 70–99)
Potassium: 4.3 mmol/L (ref 3.5–5.1)
Sodium: 141 mmol/L (ref 135–145)

## 2021-03-22 LAB — CBC
HCT: 50.9 % — ABNORMAL HIGH (ref 36.0–46.0)
Hemoglobin: 16.7 g/dL — ABNORMAL HIGH (ref 12.0–15.0)
MCH: 31.7 pg (ref 26.0–34.0)
MCHC: 32.8 g/dL (ref 30.0–36.0)
MCV: 96.8 fL (ref 80.0–100.0)
Platelets: 307 10*3/uL (ref 150–400)
RBC: 5.26 MIL/uL — ABNORMAL HIGH (ref 3.87–5.11)
RDW: 12.8 % (ref 11.5–15.5)
WBC: 6.2 10*3/uL (ref 4.0–10.5)
nRBC: 0 % (ref 0.0–0.2)

## 2021-03-22 LAB — GLUCOSE, CAPILLARY: Glucose-Capillary: 159 mg/dL — ABNORMAL HIGH (ref 70–99)

## 2021-03-22 LAB — HEMOGLOBIN A1C
Hgb A1c MFr Bld: 6.1 % — ABNORMAL HIGH (ref 4.8–5.6)
Mean Plasma Glucose: 128.37 mg/dL

## 2021-03-22 NOTE — Progress Notes (Signed)
PCP - Dr. [name] Cardiologist - Denies Endocrinologist: Dr. Delrae Rend Oncologist: Dr. Nicholas Lose  PPM/ICD - Denies  Chest x-ray - N/A EKG - 04/17/20 Stress Test - Denies ECHO - Denies Cardiac Cath - Denies  Sleep Study - Yes, OSA CPAP - Yes  Patient is a Type II diabetic, medication controlled. Patient does not check her blood sugars.  Blood Thinner Instructions: N/A Aspirin Instructions: N/A  ERAS Protcol - Yes PRE-SURGERY Ensure or G2- No  COVID TEST- No   Anesthesia review: Yes, seed placement  Patient denies shortness of breath, fever, cough and chest pain at PAT appointment   All instructions explained to the patient, with a verbal understanding of the material. Patient agrees to go over the instructions while at home for a better understanding. Patient also instructed to self quarantine after being tested for COVID-19. The opportunity to ask questions was provided.

## 2021-03-23 NOTE — Anesthesia Preprocedure Evaluation (Addendum)
Anesthesia Evaluation  Patient identified by MRN, date of birth, ID band Patient awake    Reviewed: Allergy & Precautions, NPO status , Patient's Chart, lab work & pertinent test results  History of Anesthesia Complications (+) PONV  Airway Mallampati: III  TM Distance: >3 FB Neck ROM: Full    Dental no notable dental hx.    Pulmonary asthma , sleep apnea and Continuous Positive Airway Pressure Ventilation ,    Pulmonary exam normal        Cardiovascular hypertension, Pt. on medications and Pt. on home beta blockers  Rhythm:Regular Rate:Normal     Neuro/Psych Anxiety Depression negative neurological ROS     GI/Hepatic negative GI ROS, Neg liver ROS,   Endo/Other  diabetes, Type 2, Oral Hypoglycemic Agents  Renal/GU   negative genitourinary   Musculoskeletal Right breast lesion   Abdominal Normal abdominal exam  (+)   Peds  Hematology negative hematology ROS (+)   Anesthesia Other Findings   Reproductive/Obstetrics                           Anesthesia Physical Anesthesia Plan  ASA: 3  Anesthesia Plan: General   Post-op Pain Management: Gabapentin PO (pre-op), Tylenol PO (pre-op) and Celebrex PO (pre-op)   Induction: Intravenous  PONV Risk Score and Plan: 4 or greater and Ondansetron, Aprepitant, Dexamethasone, Midazolam, Scopolamine patch - Pre-op, Treatment may vary due to age or medical condition and Amisulpride  Airway Management Planned: Mask and LMA  Additional Equipment: None  Intra-op Plan:   Post-operative Plan: Extubation in OR  Informed Consent: I have reviewed the patients History and Physical, chart, labs and discussed the procedure including the risks, benefits and alternatives for the proposed anesthesia with the patient or authorized representative who has indicated his/her understanding and acceptance.     Dental advisory given  Plan Discussed with:  CRNA  Anesthesia Plan Comments: (History of difficult airway. Review of anesthesia records in Epic shows glidescope used for intubation 06/07/2014.  Lab Results      Component                Value               Date                      WBC                      6.2                 03/22/2021                HGB                      16.7 (H)            03/22/2021                HCT                      50.9 (H)            03/22/2021                MCV                      96.8  03/22/2021                PLT                      307                 03/22/2021           Lab Results      Component                Value               Date                      NA                       141                 03/22/2021                K                        4.3                 03/22/2021                CO2                      24                  03/22/2021                GLUCOSE                  118 (H)             03/22/2021                BUN                      14                  03/22/2021                CREATININE               1.09 (H)            03/22/2021                CALCIUM                  9.8                 03/22/2021                GFRNONAA                 >60                 03/22/2021          )       Anesthesia Quick Evaluation

## 2021-03-27 ENCOUNTER — Other Ambulatory Visit (HOSPITAL_COMMUNITY): Payer: Self-pay

## 2021-03-28 ENCOUNTER — Ambulatory Visit
Admission: RE | Admit: 2021-03-28 | Discharge: 2021-03-28 | Disposition: A | Discharge status: Home / Self Care | Payer: 59 | Source: Ambulatory Visit | Attending: General Surgery | Admitting: General Surgery

## 2021-03-28 ENCOUNTER — Other Ambulatory Visit (HOSPITAL_COMMUNITY): Payer: Self-pay

## 2021-03-28 DIAGNOSIS — M6530 Trigger finger, unspecified finger: Secondary | ICD-10-CM | POA: Diagnosis not present

## 2021-03-28 DIAGNOSIS — N6489 Other specified disorders of breast: Secondary | ICD-10-CM

## 2021-03-28 DIAGNOSIS — M25649 Stiffness of unspecified hand, not elsewhere classified: Secondary | ICD-10-CM | POA: Diagnosis not present

## 2021-03-28 DIAGNOSIS — M65331 Trigger finger, right middle finger: Secondary | ICD-10-CM | POA: Diagnosis not present

## 2021-03-28 DIAGNOSIS — R928 Other abnormal and inconclusive findings on diagnostic imaging of breast: Secondary | ICD-10-CM | POA: Diagnosis not present

## 2021-03-28 MED ORDER — BUPROPION HCL ER (XL) 300 MG PO TB24
300.0000 mg | ORAL_TABLET | Freq: Every morning | ORAL | 0 refills | Status: DC
  Filled 2021-03-28: qty 90, 90d supply, fill #0

## 2021-03-28 MED ORDER — PREDNISONE 20 MG PO TABS
ORAL_TABLET | ORAL | 0 refills | Status: AC
  Filled 2021-03-28: qty 24, 12d supply, fill #0

## 2021-03-28 MED FILL — Citalopram Hydrobromide Tab 20 MG (Base Equiv): ORAL | 90 days supply | Qty: 90 | Fill #2 | Status: AC

## 2021-03-29 ENCOUNTER — Ambulatory Visit (HOSPITAL_COMMUNITY)
Admission: RE | Admit: 2021-03-29 | Discharge: 2021-03-29 | Disposition: A | Discharge status: Home / Self Care | Payer: 59 | Attending: General Surgery | Admitting: General Surgery

## 2021-03-29 ENCOUNTER — Encounter (HOSPITAL_COMMUNITY): Payer: Self-pay | Admitting: General Surgery

## 2021-03-29 ENCOUNTER — Ambulatory Visit (HOSPITAL_COMMUNITY): Payer: 59 | Admitting: Physician Assistant

## 2021-03-29 ENCOUNTER — Other Ambulatory Visit: Payer: Self-pay

## 2021-03-29 ENCOUNTER — Encounter (HOSPITAL_COMMUNITY)
Admission: RE | Disposition: A | Discharge status: Home / Self Care | Payer: Self-pay | Source: Home / Self Care | Attending: General Surgery

## 2021-03-29 ENCOUNTER — Other Ambulatory Visit (HOSPITAL_COMMUNITY): Payer: Self-pay

## 2021-03-29 ENCOUNTER — Ambulatory Visit
Admission: RE | Admit: 2021-03-29 | Discharge: 2021-03-29 | Disposition: A | Discharge status: Home / Self Care | Payer: 59 | Source: Ambulatory Visit | Attending: General Surgery | Admitting: General Surgery

## 2021-03-29 ENCOUNTER — Ambulatory Visit (HOSPITAL_COMMUNITY): Payer: 59 | Admitting: Certified Registered"

## 2021-03-29 DIAGNOSIS — Z853 Personal history of malignant neoplasm of breast: Secondary | ICD-10-CM | POA: Diagnosis not present

## 2021-03-29 DIAGNOSIS — G4730 Sleep apnea, unspecified: Secondary | ICD-10-CM | POA: Insufficient documentation

## 2021-03-29 DIAGNOSIS — E119 Type 2 diabetes mellitus without complications: Secondary | ICD-10-CM | POA: Insufficient documentation

## 2021-03-29 DIAGNOSIS — N6489 Other specified disorders of breast: Secondary | ICD-10-CM

## 2021-03-29 DIAGNOSIS — R921 Mammographic calcification found on diagnostic imaging of breast: Secondary | ICD-10-CM | POA: Diagnosis not present

## 2021-03-29 DIAGNOSIS — I10 Essential (primary) hypertension: Secondary | ICD-10-CM | POA: Insufficient documentation

## 2021-03-29 DIAGNOSIS — E7800 Pure hypercholesterolemia, unspecified: Secondary | ICD-10-CM | POA: Diagnosis not present

## 2021-03-29 DIAGNOSIS — J45909 Unspecified asthma, uncomplicated: Secondary | ICD-10-CM | POA: Diagnosis not present

## 2021-03-29 DIAGNOSIS — Z7984 Long term (current) use of oral hypoglycemic drugs: Secondary | ICD-10-CM | POA: Diagnosis not present

## 2021-03-29 DIAGNOSIS — R928 Other abnormal and inconclusive findings on diagnostic imaging of breast: Secondary | ICD-10-CM | POA: Diagnosis not present

## 2021-03-29 DIAGNOSIS — E282 Polycystic ovarian syndrome: Secondary | ICD-10-CM | POA: Diagnosis not present

## 2021-03-29 HISTORY — PX: BREAST LUMPECTOMY WITH RADIOACTIVE SEED LOCALIZATION: SHX6424

## 2021-03-29 LAB — GLUCOSE, CAPILLARY
Glucose-Capillary: 119 mg/dL — ABNORMAL HIGH (ref 70–99)
Glucose-Capillary: 142 mg/dL — ABNORMAL HIGH (ref 70–99)

## 2021-03-29 SURGERY — BREAST LUMPECTOMY WITH RADIOACTIVE SEED LOCALIZATION
Anesthesia: General | Site: Breast | Laterality: Right

## 2021-03-29 MED ORDER — CELECOXIB 200 MG PO CAPS: 200.0000 mg | ORAL_CAPSULE | ORAL | Status: AC

## 2021-03-29 MED ORDER — LIDOCAINE 2% (20 MG/ML) 5 ML SYRINGE
INTRAMUSCULAR | Status: DC | PRN
  Administered 2021-03-29: 60 mg via INTRAVENOUS

## 2021-03-29 MED ORDER — MIDAZOLAM HCL 5 MG/5ML IJ SOLN
INTRAMUSCULAR | Status: DC | PRN
  Administered 2021-03-29: 2 mg via INTRAVENOUS

## 2021-03-29 MED ORDER — FENTANYL CITRATE (PF) 100 MCG/2ML IJ SOLN: 25.0000 ug | INTRAMUSCULAR | Status: DC | PRN

## 2021-03-29 MED ORDER — 0.9 % SODIUM CHLORIDE (POUR BTL) OPTIME
TOPICAL | Status: DC | PRN
  Administered 2021-03-29: 11:00:00 1000 mL

## 2021-03-29 MED ORDER — GABAPENTIN 300 MG PO CAPS
ORAL_CAPSULE | ORAL | Status: AC
  Administered 2021-03-29: 10:00:00 300 mg via ORAL
  Filled 2021-03-29: qty 1

## 2021-03-29 MED ORDER — CELECOXIB 200 MG PO CAPS
ORAL_CAPSULE | ORAL | Status: AC
  Administered 2021-03-29: 10:00:00 200 mg via ORAL
  Filled 2021-03-29: qty 1

## 2021-03-29 MED ORDER — MIDAZOLAM HCL 2 MG/2ML IJ SOLN
INTRAMUSCULAR | Status: AC
  Filled 2021-03-29: qty 2

## 2021-03-29 MED ORDER — ACETAMINOPHEN 500 MG PO TABS
ORAL_TABLET | ORAL | Status: AC
  Administered 2021-03-29: 10:00:00 1000 mg via ORAL
  Filled 2021-03-29: qty 2

## 2021-03-29 MED ORDER — LACTATED RINGERS IV SOLN: INTRAVENOUS | Status: DC | PRN

## 2021-03-29 MED ORDER — APREPITANT 40 MG PO CAPS: 40.0000 mg | ORAL_CAPSULE | Freq: Once | ORAL | Status: DC

## 2021-03-29 MED ORDER — SCOPOLAMINE 1 MG/3DAYS TD PT72: 1.0000 | MEDICATED_PATCH | TRANSDERMAL | Status: DC

## 2021-03-29 MED ORDER — PROPOFOL 10 MG/ML IV BOLUS
INTRAVENOUS | Status: DC | PRN
  Administered 2021-03-29 (×2): 100 mg via INTRAVENOUS
  Administered 2021-03-29: 50 mg via INTRAVENOUS

## 2021-03-29 MED ORDER — PROPOFOL 10 MG/ML IV BOLUS
INTRAVENOUS | Status: AC
  Filled 2021-03-29: qty 20

## 2021-03-29 MED ORDER — BUPIVACAINE-EPINEPHRINE 0.25% -1:200000 IJ SOLN
INTRAMUSCULAR | Status: DC | PRN
  Administered 2021-03-29: 10 mL

## 2021-03-29 MED ORDER — BUPIVACAINE-EPINEPHRINE (PF) 0.25% -1:200000 IJ SOLN
INTRAMUSCULAR | Status: AC
  Filled 2021-03-29: qty 30

## 2021-03-29 MED ORDER — APREPITANT 40 MG PO CAPS
ORAL_CAPSULE | ORAL | Status: AC
  Administered 2021-03-29: 11:00:00 40 mg via ORAL
  Filled 2021-03-29: qty 1

## 2021-03-29 MED ORDER — SCOPOLAMINE 1 MG/3DAYS TD PT72
MEDICATED_PATCH | TRANSDERMAL | Status: AC
  Administered 2021-03-29: 11:00:00 1.5 mg via TRANSDERMAL
  Filled 2021-03-29: qty 1

## 2021-03-29 MED ORDER — LACTATED RINGERS IV SOLN: INTRAVENOUS | Status: DC

## 2021-03-29 MED ORDER — CEFAZOLIN SODIUM-DEXTROSE 2-4 GM/100ML-% IV SOLN
2.0000 g | INTRAVENOUS | Status: AC
  Administered 2021-03-29: 11:00:00 2 g via INTRAVENOUS

## 2021-03-29 MED ORDER — ORAL CARE MOUTH RINSE: 15.0000 mL | Freq: Once | OROMUCOSAL | Status: AC

## 2021-03-29 MED ORDER — CEFAZOLIN SODIUM-DEXTROSE 2-4 GM/100ML-% IV SOLN
INTRAVENOUS | Status: AC
  Filled 2021-03-29: qty 100

## 2021-03-29 MED ORDER — LIDOCAINE 2% (20 MG/ML) 5 ML SYRINGE
INTRAMUSCULAR | Status: AC
  Filled 2021-03-29: qty 5

## 2021-03-29 MED ORDER — ONDANSETRON HCL 4 MG/2ML IJ SOLN
INTRAMUSCULAR | Status: DC | PRN
  Administered 2021-03-29: 4 mg via INTRAVENOUS

## 2021-03-29 MED ORDER — CHLORHEXIDINE GLUCONATE CLOTH 2 % EX PADS: 6.0000 | MEDICATED_PAD | Freq: Once | CUTANEOUS | Status: DC

## 2021-03-29 MED ORDER — CHLORHEXIDINE GLUCONATE 0.12 % MT SOLN: 15.0000 mL | Freq: Once | OROMUCOSAL | Status: AC

## 2021-03-29 MED ORDER — APREPITANT 40 MG PO CAPS: 40.0000 mg | ORAL_CAPSULE | Freq: Once | ORAL | Status: AC

## 2021-03-29 MED ORDER — FENTANYL CITRATE (PF) 100 MCG/2ML IJ SOLN
INTRAMUSCULAR | Status: DC | PRN
  Administered 2021-03-29 (×2): 50 ug via INTRAVENOUS
  Administered 2021-03-29: 25 ug via INTRAVENOUS

## 2021-03-29 MED ORDER — AMISULPRIDE (ANTIEMETIC) 5 MG/2ML IV SOLN: 10.0000 mg | Freq: Once | INTRAVENOUS | Status: DC | PRN

## 2021-03-29 MED ORDER — FENTANYL CITRATE (PF) 250 MCG/5ML IJ SOLN
INTRAMUSCULAR | Status: AC
  Filled 2021-03-29: qty 5

## 2021-03-29 MED ORDER — CHLORHEXIDINE GLUCONATE 0.12 % MT SOLN
OROMUCOSAL | Status: AC
  Administered 2021-03-29: 10:00:00 15 mL via OROMUCOSAL
  Filled 2021-03-29: qty 15

## 2021-03-29 MED ORDER — ONDANSETRON HCL 4 MG/2ML IJ SOLN
INTRAMUSCULAR | Status: AC
  Filled 2021-03-29: qty 2

## 2021-03-29 MED ORDER — HYDROCODONE-ACETAMINOPHEN 5-325 MG PO TABS
1.0000 | ORAL_TABLET | Freq: Four times a day (QID) | ORAL | 0 refills | Status: DC | PRN
  Filled 2021-03-29: qty 10, 4d supply, fill #0

## 2021-03-29 MED ORDER — ACETAMINOPHEN 500 MG PO TABS: 1000.0000 mg | ORAL_TABLET | ORAL | Status: AC

## 2021-03-29 MED ORDER — GABAPENTIN 300 MG PO CAPS: 300.0000 mg | ORAL_CAPSULE | ORAL | Status: AC

## 2021-03-29 SURGICAL SUPPLY — 35 items
APPLIER CLIP 9.375 MED OPEN (MISCELLANEOUS)
BAG COUNTER SPONGE SURGICOUNT (BAG) IMPLANT
BAG SURGICOUNT SPONGE COUNTING (BAG)
BINDER BREAST LRG (GAUZE/BANDAGES/DRESSINGS) IMPLANT
BINDER BREAST XLRG (GAUZE/BANDAGES/DRESSINGS) ×2 IMPLANT
CANISTER SUCT 3000ML PPV (MISCELLANEOUS) ×3 IMPLANT
CHLORAPREP W/TINT 26 (MISCELLANEOUS) ×3 IMPLANT
CLIP APPLIE 9.375 MED OPEN (MISCELLANEOUS) IMPLANT
COVER PROBE W GEL 5X96 (DRAPES) ×3 IMPLANT
COVER SURGICAL LIGHT HANDLE (MISCELLANEOUS) ×3 IMPLANT
DERMABOND ADVANCED (GAUZE/BANDAGES/DRESSINGS) ×2
DERMABOND ADVANCED .7 DNX12 (GAUZE/BANDAGES/DRESSINGS) ×1 IMPLANT
DEVICE DUBIN SPECIMEN MAMMOGRA (MISCELLANEOUS) ×3 IMPLANT
DRAPE CHEST BREAST 15X10 FENES (DRAPES) ×3 IMPLANT
ELECT COATED BLADE 2.86 ST (ELECTRODE) ×3 IMPLANT
ELECT REM PT RETURN 9FT ADLT (ELECTROSURGICAL) ×3
ELECTRODE REM PT RTRN 9FT ADLT (ELECTROSURGICAL) ×1 IMPLANT
GAUZE SPONGE 4X4 12PLY STRL (GAUZE/BANDAGES/DRESSINGS) ×2 IMPLANT
GLOVE SURG ENC MOIS LTX SZ7.5 (GLOVE) ×6 IMPLANT
GOWN STRL REUS W/ TWL LRG LVL3 (GOWN DISPOSABLE) ×2 IMPLANT
GOWN STRL REUS W/TWL LRG LVL3 (GOWN DISPOSABLE) ×6
KIT BASIN OR (CUSTOM PROCEDURE TRAY) ×3 IMPLANT
KIT MARKER MARGIN INK (KITS) ×3 IMPLANT
LIGHT WAVEGUIDE WIDE FLAT (MISCELLANEOUS) IMPLANT
NDL HYPO 25GX1X1/2 BEV (NEEDLE) ×1 IMPLANT
NEEDLE HYPO 25GX1X1/2 BEV (NEEDLE) ×3 IMPLANT
NS IRRIG 1000ML POUR BTL (IV SOLUTION) ×3 IMPLANT
PACK GENERAL/GYN (CUSTOM PROCEDURE TRAY) ×3 IMPLANT
PAD ABD 7.5X8 STRL (GAUZE/BANDAGES/DRESSINGS) ×2 IMPLANT
SUT MNCRL AB 4-0 PS2 18 (SUTURE) ×3 IMPLANT
SUT SILK 2 0 SH (SUTURE) IMPLANT
SUT VIC AB 3-0 SH 18 (SUTURE) ×3 IMPLANT
SYR CONTROL 10ML LL (SYRINGE) ×3 IMPLANT
TOWEL GREEN STERILE (TOWEL DISPOSABLE) ×3 IMPLANT
TOWEL GREEN STERILE FF (TOWEL DISPOSABLE) ×3 IMPLANT

## 2021-03-29 NOTE — Interval H&P Note (Signed)
History and Physical Interval Note:  03/29/2021 10:29 AM  Nicole Khan  has presented today for surgery, with the diagnosis of RIGHT BREAST COMPLEX SCLEROSING LESION.  The various methods of treatment have been discussed with the patient and family. After consideration of risks, benefits and other options for treatment, the patient has consented to  Procedure(s): RIGHT BREAST LUMPECTOMY WITH RADIOACTIVE SEED LOCALIZATION (Right) as a surgical intervention.  The patient's history has been reviewed, patient examined, no change in status, stable for surgery.  I have reviewed the patient's chart and labs.  Questions were answered to the patient's satisfaction.     [name]

## 2021-03-29 NOTE — Op Note (Signed)
03/29/2021  11:36 AM  PATIENT:  Nicole Khan  55 y.o. female  PRE-OPERATIVE DIAGNOSIS:  RIGHT BREAST COMPLEX SCLEROSING LESION  POST-OPERATIVE DIAGNOSIS:  RIGHT BREAST COMPLEX SCLEROSING LESION  PROCEDURE:  Procedure(s): RIGHT BREAST LUMPECTOMY WITH RADIOACTIVE SEED LOCALIZATION (Right)  SURGEON:  Surgeon(s) and Role:    * Jovita Kussmaul, MD - Primary  PHYSICIAN ASSISTANT:   ASSISTANTS: none   ANESTHESIA:   local and general  EBL:  minimal   BLOOD ADMINISTERED:none  DRAINS: none   LOCAL MEDICATIONS USED:  MARCAINE     SPECIMEN:  Source of Specimen:  right breast tissue  DISPOSITION OF SPECIMEN:  PATHOLOGY  COUNTS:  YES  TOURNIQUET:  * No tourniquets in log *  DICTATION: .Dragon Dictation  After informed consent was obtained the patient was brought to the operating room and placed in the supine position on the operating table.  After adequate induction of general anesthesia the patient's right breast was prepped with ChloraPrep, allowed to dry, and draped in usual sterile manner.  An appropriate timeout was performed.  Previously an I-125 seed was placed in the upper outer right breast to mark an area of complex sclerosing lesion.  The neoprobe was set to I-125 in the area of radioactivity was readily identified.  The area around this was infiltrated with quarter percent Marcaine.  A curvilinear incision was then made along the upper edge of the areola of the right breast with a 15 blade knife.  The incision was carried through the skin and subcutaneous tissue sharply with the electrocautery.  Dissection was then carried towards the radioactive seed with the electrocautery under the direction of the neoprobe.  Once I more closely approached the radioactive seed I then removed a circular portion of breast tissue sharply with the electrocautery around the radioactive seed while checking the area of radioactivity frequently.  Once the specimen was removed it was oriented  with the appropriate paint colors.  A specimen radiograph was obtained that showed the clip and seed to be near the center of the specimen.  Specimen was then sent to pathology for further evaluation.  Hemostasis was achieved using the Bovie electrocautery.  The wound was irrigated with saline and infiltrated with more quarter percent Marcaine.  The deep layer of the incision was closed with layers of interrupted 3-0 Vicryl stitches.  The skin was then closed with interrupted 4-0 Monocryl subcuticular stitches.  Dermabond dressings were applied.  The patient tolerated the procedure well.  At the end of the case all needle sponge and instrument counts were correct.  The patient was then awakened and taken to recovery in stable condition.  PLAN OF CARE: Discharge to home after PACU  PATIENT DISPOSITION:  PACU - hemodynamically stable.   Delay start of Pharmacological VTE agent (>24hrs) due to surgical blood loss or risk of bleeding: not applicable

## 2021-03-29 NOTE — Transfer of Care (Addendum)
Immediate Anesthesia Transfer of Care Note  Patient: Nicole Khan  Procedure(s) Performed: RIGHT BREAST LUMPECTOMY WITH RADIOACTIVE SEED LOCALIZATION (Right: Breast)  Patient Location: PACU  Anesthesia Type:General  Level of Consciousness: drowsy and responds to stimulation  Airway & Oxygen Therapy: Patient Spontanous Breathing and Patient connected to face mask oxygen  Post-op Assessment: Report given to RN and Post -op Vital signs reviewed and stable  Post vital signs: Reviewed and stable  Last Vitals:  Vitals Value Taken Time  BP 106/66 03/29/21 1144  Temp    Pulse 84 03/29/21 1145  Resp 16 03/29/21 1145  SpO2 90% 03/29/21 1145  Vitals shown include unvalidated device data.  Last Pain:  Vitals:   03/29/21 1013  TempSrc:   PainSc: 0-No pain      Patients Stated Pain Goal: 2 (58/25/18 9842)  Complications: No notable events documented.

## 2021-03-29 NOTE — H&P (Signed)
PROVIDER: Landry Corporal, MD  MRN: [id number] DOB: Feb 27, 1966 Subjective   Chief Complaint: New Patient (SPOT ON BREAST)   History of Present Illness: Nicole Khan is a 55 y.o. female who is seen today for complex sclerosing lesion. The patient is a 55 year old white female who is 1 year status post right breast lumpectomy and sentinel node biopsy for a T1BN0 right breast cancer that was ER and PR positive and HER2 negative with a Ki-67 of 5%. On her recent follow-up mammogram she was found to have a new area of calcification measuring 5 mm in the upper portion of the right breast. This was biopsied and came back as a complex sclerosing lesion. She has been well since her last visit.  Review of Systems: A complete review of systems was obtained from the patient. I have reviewed this information and discussed as appropriate with the patient. See HPI as well for other ROS.  ROS   Medical History: Past Medical History:  Diagnosis Date   Anxiety   Asthma, unspecified asthma severity, unspecified whether complicated, unspecified whether persistent   Diabetes mellitus without complication (CMS-HCC)   History of cancer   Hypertension   Sleep apnea   Patient Active Problem List  Diagnosis   Complex sclerosing lesion of right breast   Past Surgical History:  Procedure Laterality Date   ankle surgery   MASTECTOMY   removal of spot on left breast   trigger finger surgery    Allergies  Allergen Reactions   Other Anaphylaxis and Other (See Comments)  Soy milk   Celery (Apium Graveolens) (Umbelliferae) Itching and Other (See Comments)  Causes itching in the mouth.   Hazelnut Other (See Comments) and Swelling  Hazelnuts, makes patients mouth swell   Current Outpatient Medications on File Prior to Visit  Medication Sig Dispense Refill   atorvastatin (LIPITOR) 40 MG tablet Take by mouth   buPROPion (WELLBUTRIN XL) 300 MG XL tablet   citalopram (CELEXA) 20 MG tablet Take by  mouth   metFORMIN (GLUCOPHAGE-XR) 500 MG XR tablet Take by mouth   metoprolol tartrate (LOPRESSOR) 100 MG tablet   spironolactone (ALDACTONE) 100 MG tablet Take 1 tablet (100 mg total) by mouth 2 (two) times daily   anastrozole (ARIMIDEX) 1 mg tablet   JARDIANCE 10 mg tablet   TRULICITY 3 UX/3.2 mL subcutaneous pen injector   No current facility-administered medications on file prior to visit.   Family History  Problem Relation Age of Onset   High blood pressure (Hypertension) Mother   High blood pressure (Hypertension) Father   Breast cancer Sister   High blood pressure (Hypertension) Sister   Diabetes Brother   High blood pressure (Hypertension) Brother    Social History   Tobacco Use  Smoking Status Never  Smokeless Tobacco Never    Social History   Socioeconomic History   Marital status: Married  Tobacco Use   Smoking status: Never   Smokeless tobacco: Never  Vaping Use   Vaping Use: Never used  Substance and Sexual Activity   Alcohol use: Never   Drug use: Never   Objective:   Vitals:  BP: 134/86  Pulse: 85  Temp: 36.9 C (98.5 F)  SpO2: 98%  Weight: 82.9 kg (182 lb 12.8 oz)  Height: 154.9 cm (5' 1" )   Body mass index is 34.54 kg/m.  Physical Exam Vitals reviewed.  Constitutional:  General: She is not in acute distress. Appearance: Normal appearance.  HENT:  Head: Normocephalic and atraumatic.  Right  Ear: External ear normal.  Left Ear: External ear normal.  Nose: Nose normal.  Mouth/Throat:  Mouth: Mucous membranes are moist.  Pharynx: Oropharynx is clear.  Eyes:  General: No scleral icterus. Extraocular Movements: Extraocular movements intact.  Conjunctiva/sclera: Conjunctivae normal.  Pupils: Pupils are equal, round, and reactive to light.  Cardiovascular:  Rate and Rhythm: Normal rate and regular rhythm.  Pulses: Normal pulses.  Heart sounds: Normal heart sounds.  Pulmonary:  Effort: Pulmonary effort is normal. No respiratory  distress.  Breath sounds: Normal breath sounds.  Abdominal:  General: Bowel sounds are normal.  Palpations: Abdomen is soft.  Tenderness: There is no abdominal tenderness.  Musculoskeletal:  General: No swelling, tenderness or deformity. Normal range of motion.  Cervical back: Normal range of motion and neck supple.  Skin: General: Skin is warm and dry.  Coloration: Skin is not jaundiced.  Neurological:  General: No focal deficit present.  Mental Status: She is alert and oriented to person, place, and time.  Psychiatric:  Mood and Affect: Mood normal.  Behavior: Behavior normal.     Breast: The right breast periareolar incision has healed nicely. There is still some edema and swelling of the right breast compared to the left secondary to radiation. There is some slight redness in the upper portion of the right breast which is new and some firmness at the biopsy site. There is no palpable mass in the left breast. There is no palpable axillary, supraclavicular, or cervical lymphadenopathy.  Labs, Imaging and Diagnostic Testing:  Assessment and Plan:  Diagnoses and all orders for this visit:  Complex sclerosing lesion of right breast - [facility] Case Posting Request; Future    The patient is about 1 year status post right breast lumpectomy for breast cancer. On her recent mammogram she was found to have a new 5 mm area of calcification in the upper right breast that came back as a complex sclerosing lesion. Given her history of breast cancer and the findings the recommendation is to have this area removed to make sure that we are not missing a recurrence of the cancer. I have discussed with her in detail the risks and benefits of the operation as well as some of the technical aspects including the use of a radioactive seed for localization and she understands and wishes to proceed. I will start her on doxycycline for the redness to see if this improves prior to surgery.

## 2021-03-29 NOTE — Anesthesia Procedure Notes (Signed)
Procedure Name: LMA Insertion Date/Time: 03/29/2021 10:54 AM Performed by: Cathren Harsh, CRNA Pre-anesthesia Checklist: Patient identified, Emergency Drugs available, Suction available and Patient being monitored Patient Re-evaluated:Patient Re-evaluated prior to induction Oxygen Delivery Method: Circle System Utilized Preoxygenation: Pre-oxygenation with 100% oxygen Induction Type: IV induction Ventilation: Mask ventilation without difficulty LMA: LMA inserted LMA Size: 4.0 Number of attempts: 1 Airway Equipment and Method: Bite block Placement Confirmation: positive ETCO2 Tube secured with: Tape Dental Injury: Teeth and Oropharynx as per pre-operative assessment

## 2021-03-29 NOTE — Anesthesia Postprocedure Evaluation (Signed)
Anesthesia Post Note  Patient: Nicole Khan  Procedure(s) Performed: RIGHT BREAST LUMPECTOMY WITH RADIOACTIVE SEED LOCALIZATION (Right: Breast)     Patient location during evaluation: PACU Anesthesia Type: General Level of consciousness: awake and alert Pain management: pain level controlled Vital Signs Assessment: post-procedure vital signs reviewed and stable Respiratory status: spontaneous breathing, nonlabored ventilation, respiratory function stable and patient connected to nasal cannula oxygen Cardiovascular status: blood pressure returned to baseline and stable Postop Assessment: no apparent nausea or vomiting Anesthetic complications: no   No notable events documented.  Last Vitals:  Vitals:   03/29/21 1230 03/29/21 1245  BP: 97/64 91/79  Pulse: 81 82  Resp: 20 15  Temp:  36.7 C  SpO2: 97% 93%    Last Pain:  Vitals:   03/29/21 1245  TempSrc:   PainSc: 0-No pain                 Belenda Cruise P Cashlyn Huguley

## 2021-04-04 ENCOUNTER — Other Ambulatory Visit (HOSPITAL_COMMUNITY): Payer: Self-pay

## 2021-04-05 LAB — SURGICAL PATHOLOGY

## 2021-04-13 DIAGNOSIS — I10 Essential (primary) hypertension: Secondary | ICD-10-CM | POA: Diagnosis not present

## 2021-04-13 DIAGNOSIS — E669 Obesity, unspecified: Secondary | ICD-10-CM | POA: Diagnosis not present

## 2021-04-13 DIAGNOSIS — F325 Major depressive disorder, single episode, in full remission: Secondary | ICD-10-CM | POA: Diagnosis not present

## 2021-04-13 DIAGNOSIS — Z23 Encounter for immunization: Secondary | ICD-10-CM | POA: Diagnosis not present

## 2021-04-13 DIAGNOSIS — C50411 Malignant neoplasm of upper-outer quadrant of right female breast: Secondary | ICD-10-CM | POA: Diagnosis not present

## 2021-04-23 ENCOUNTER — Ambulatory Visit: Payer: 59 | Attending: Hematology and Oncology

## 2021-04-23 ENCOUNTER — Other Ambulatory Visit: Payer: Self-pay

## 2021-04-23 VITALS — Wt 179.0 lb

## 2021-04-23 DIAGNOSIS — Z483 Aftercare following surgery for neoplasm: Secondary | ICD-10-CM | POA: Insufficient documentation

## 2021-04-23 NOTE — Therapy (Signed)
[facility] @ [address] [place], [place], [address] Phone: [phone]   Fax:  [phone]  Physical Therapy Treatment  Patient Details  Name: Nicole Khan MRN: [id number] Date of Birth: 03-11-1966 Referring Provider (PT): Dr. [name]   Encounter Date: 04/23/2021   PT End of Session - 04/23/21 1521     Visit Number 4   # unchanged due to screen only   PT Start Time 1519    PT Stop Time 5208    PT Time Calculation (min) 4 min    Activity Tolerance Patient tolerated treatment well    Behavior During Therapy [facility] for tasks assessed/performed             Past Medical History:  Diagnosis Date   Abnormal mammogram of left breast 09/26/2017   Abnormal Pap smear of cervix    Anxiety    Asthma    exercise induced asthma   Breast cancer ([place])    Cancer ([place])    Depression    Diabetes mellitus without complication ([place])    type 2   Family history of breast cancer 04/11/2020   Family history of prostate cancer 04/11/2020   Family history of thyroid cancer 04/11/2020   Fracture dislocation of right ankle joint 06/04/2013   Fracture of distal fibula 06/04/2013   H/O bilateral salpingectomy 05/2015   History of kidney stones    passed stones, no surgery required   Hypercholesteremia    Hypertension    Personal history of radiation therapy    Polycystic ovary disease    PONV (postoperative nausea and vomiting)    Sleep apnea 05/2017   uses cpap   SVD (spontaneous vaginal delivery)    x 1    Past Surgical History:  Procedure Laterality Date   BREAST BIOPSY Right 02/29/2020   BREAST EXCISIONAL BIOPSY Left 2019   Benign   BREAST LUMPECTOMY     BREAST LUMPECTOMY WITH RADIOACTIVE SEED AND SENTINEL LYMPH NODE BIOPSY Right 04/20/2020   Procedure: RIGHT BREAST LUMPECTOMY WITH RADIOACTIVE SEED AND SENTINEL LYMPH NODE BIOPSY;  Surgeon: Jovita Kussmaul, MD;  Location: [facility];  Service: General;  Laterality: Right;    BREAST LUMPECTOMY WITH RADIOACTIVE SEED LOCALIZATION Left 09/26/2017   Procedure: LEFT BREAST LUMPECTOMY [place];  Surgeon: Fanny Skates, MD;  Location: [facility];  Service: General;  Laterality: Left;   BREAST LUMPECTOMY WITH RADIOACTIVE SEED LOCALIZATION Right 03/29/2021   Procedure: RIGHT BREAST LUMPECTOMY WITH RADIOACTIVE SEED LOCALIZATION;  Surgeon: Jovita Kussmaul, MD;  Location: [facility];  Service: General;  Laterality: Right;   CARPAL TUNNEL RELEASE     bilateral   CERVICAL CERCLAGE  07/1991; 05/1999   x 2   CESAREAN SECTION     x 1   CHOLECYSTECTOMY, LAPAROSCOPIC  1996   COLONOSCOPY WITH PROPOFOL N/A 06/24/2017   Procedure: COLONOSCOPY WITH PROPOFOL;  Surgeon: Juanita Craver, MD;  Location: [facility];  Service: Endoscopy;  Laterality: N/A;   CYSTOSCOPY N/A 06/07/2014   Procedure: CYSTOSCOPY;  Surgeon: Megan Salon, MD;  Location: [facility];  Service: Gynecology;  Laterality: N/A;   LAPAROSCOPIC BILATERAL SALPINGO OOPHERECTOMY Right 06/07/2014   Procedure: LAPAROSCOPIC RIGHT SALPINGO OOPHORECTOMY/COLLECTION OF PELVIC WASHINGS;  Surgeon: Megan Salon, MD;  Location: [facility];  Service: Gynecology;  Laterality: Right;   LAPAROSCOPIC UNILATERAL SALPINGECTOMY Left 06/07/2014   Procedure: LAPAROSCOPIC UNILATERAL SALPINGECTOMY;  Surgeon: Megan Salon, MD;  Location: [facility];  Service: Gynecology;  Laterality: Left;   ORIF ANKLE FRACTURE Right 06/07/2013   Procedure: OPEN REDUCTION INTERNAL FIXATION (ORIF) ANKLE FRACTURE  FIBULA SYNDESMOSIS;  Surgeon: Ninetta Lights, MD;  Location: [facility];  Service: Orthopedics;  Laterality: Right;   RE-EXCISION OF BREAST CANCER,SUPERIOR MARGINS Right 06/01/2020   Procedure: RIGHT BREAST MARGIN REEXCISION;  Surgeon: Jovita Kussmaul, MD;  Location: [facility];  Service: General;  Laterality: Right;    Vitals:   04/23/21 1520  Weight: 179 lb (81.2 kg)     Subjective Assessment - 04/23/21  1520     Subjective Pt returns for her 3 month L-Dex screen.    Pertinent History right breast cancer diagnosed with bio Pt had reexcision on June 01, 2020.  biopsy on 02/29/20 showed invasive ductal carcinoma with DCIS, grade 1, HER-2 negative (1+), ER/PR >95%, Ki 67 5%. Surgery is scheduled for Jan 20 and she will have radiation.  Past history includes, DM, sleep apnea, bilaterl carpal tunnel surgery( 2006) and right trigger finger surgery ( 2011)                    L-DEX FLOWSHEETS - 04/23/21 1500       L-DEX LYMPHEDEMA SCREENING   Measurement Type Unilateral    L-DEX MEASUREMENT EXTREMITY Upper Extremity    POSITION  Standing    DOMINANT SIDE Right    At Risk Side Right    BASELINE SCORE (UNILATERAL) 1.8    L-DEX SCORE (UNILATERAL) -3.5    VALUE CHANGE (UNILAT) -5.3                                     PT Long Term Goals - 07/04/20 1420       PT LONG TERM GOAL #1   Title Pt will be independent in self MLD and scar massage to right breast    Time 4    Period Weeks    Target Date 08/01/20      PT LONG TERM GOAL #2   Title Pt will have decreased fibrosis/swelling at right breast    Time 6    Period Weeks    Target Date 08/01/20      PT LONG TERM GOAL #3   Title Pt will be fit for compression sleeve as indicated    Time 6    Period Weeks    Status On-going    Target Date 08/01/20                   Plan - 04/23/21 1522     Clinical Impression Statement Pt returns for her 3 month L-Dex screen. Her change form baseline of -5.3 is WNLs so no further treament is required at this time except to cont every 3 month L-Dex screen which pt is agreeable to.    PT Next Visit Plan Cont every 3 month L-Dex screens for up to 2 years from her SLNB (~04/20/22)    Consulted and Agree with Plan of Care Patient             Patient will benefit from skilled therapeutic intervention in order to improve the following deficits and  impairments:     Visit Diagnosis: Aftercare following surgery for neoplasm     Problem List Patient Active Problem List   Diagnosis Date Noted   Allergic rhinitis 11/08/2020   Exercise induced bronchospasm 11/08/2020  Genetic testing 04/17/2020   Family history of breast cancer 04/11/2020   Family history of prostate cancer 04/11/2020   Family history of thyroid cancer 04/11/2020   Malignant neoplasm of upper-outer quadrant of right breast in female, estrogen receptor positive ([place]) 03/21/2020   Abnormal mammogram of left breast 09/26/2017   Bilateral ovarian cysts 05/23/2014   Fracture of distal fibula 06/04/2013   HTN (hypertension) 04/07/2013   PCOS (polycystic ovarian syndrome) 04/07/2013   Renal calculi 04/07/2013   Adjustment disorder with mixed anxiety and depressed mood 04/07/2013    Otelia Limes, PTA 04/23/2021, 4:12 PM  [facility] @ [address] [place], [place], [address] Phone: [phone]   Fax:  [phone]  Name: Emari Hreha MRN: [id number] Date of Birth: 1965-04-11

## 2021-04-25 DIAGNOSIS — M65331 Trigger finger, right middle finger: Secondary | ICD-10-CM | POA: Diagnosis not present

## 2021-05-04 ENCOUNTER — Other Ambulatory Visit (HOSPITAL_COMMUNITY): Payer: Self-pay

## 2021-05-04 MED ORDER — JARDIANCE 10 MG PO TABS
10.0000 mg | ORAL_TABLET | Freq: Every day | ORAL | 3 refills | Status: DC
  Filled 2021-05-04: qty 90, 90d supply, fill #0
  Filled 2021-08-08: qty 90, 90d supply, fill #1
  Filled 2021-11-13: qty 90, 90d supply, fill #2
  Filled 2022-02-28: qty 90, 90d supply, fill #3

## 2021-05-11 NOTE — Progress Notes (Signed)
Patient Care Team: [name], MD as PCP - General (Family Medicine) Nicholas Lose, MD as Consulting Physician (Hematology and Oncology) Kyung Rudd, MD as Consulting Physician (Radiation Oncology) Jovita Kussmaul, MD as Consulting Physician (General Surgery) Delrae Rend, MD as Consulting Physician (Endocrinology)  DIAGNOSIS:    ICD-10-CM   1. Malignant neoplasm of upper-outer quadrant of right breast in female, estrogen receptor positive ([place])  C50.411    Z17.0       SUMMARY OF ONCOLOGIC HISTORY: Oncology History  Malignant neoplasm of upper-outer quadrant of right breast in female, estrogen receptor positive ([place])  02/29/2020 Initial Diagnosis   Screening mammogram detected right breast mass. 0.6cm mass at the 10 o'clock position with multiple benign cysts. Biopsy on 02/29/20 showed invasive ductal carcinoma with DCIS, grade 1, HER-2 negative (1+), ER/PR >95%, Ki67: 5%.    03/21/2020 Cancer Staging   Staging form: Breast, AJCC 8th Edition - Clinical stage from 03/21/2020: Stage IA (cT1b, cN0, cM0, G1, ER+, PR+, HER2-) - Signed by Nicholas Lose, MD on 03/21/2020    04/16/2020 Genetic Testing   Negative genetic testing: no pathogenic variants detected in [facility] Multi-Cancer Panel.  The report date is April 16, 2020.    The Multi-Cancer Panel offered by [facility] includes sequencing and/or deletion duplication testing of the following 85 genes: AIP, ALK, APC, ATM, AXIN2,BAP1,  BARD1, BLM, BMPR1A, BRCA1, BRCA2, BRIP1, CASR, CDC73, CDH1, CDK4, CDKN1B, CDKN1C, CDKN2A (p14ARF), CDKN2A (p16INK4a), CEBPA, CHEK2, CTNNA1, DICER1, DIS3L2, EGFR (c.2369C>T, p.Thr790Met variant only), EPCAM (Deletion/duplication testing only), FH, FLCN, GATA2, GPC3, GREM1 (Promoter region deletion/duplication testing only), HOXB13 (c.251G>A, p.Gly84Glu), HRAS, KIT, MAX, MEN1, MET, MITF (c.952G>A, p.Glu318Lys variant only), MLH1, MSH2, MSH3, MSH6, MUTYH, NBN, NF1, NF2, NTHL1, PALB2, PDGFRA, PHOX2B, PMS2, POLD1,  POLE, POT1, PRKAR1A, PTCH1, PTEN, RAD50, RAD51C, RAD51D, RB1, RECQL4, RET, RNF43, RUNX1, SDHAF2, SDHA (sequence changes only), SDHB, SDHC, SDHD, SMAD4, SMARCA4, SMARCB1, SMARCE1, STK11, SUFU, TERC, TERT, TMEM127, TP53, TSC1, TSC2, VHL, WRN and WT1.    04/20/2020 Surgery   Right lumpectomy [name]): invasive and in situ carcinoma, grade 1, 0.9cm, DCIS focally present at margin, 2 right axillary lymph nodes negative for carcinoma.   04/20/2020 Cancer Staging   Staging form: Breast, AJCC 8th Edition - Pathologic stage from 04/20/2020: Stage IA (pT1b, pN0, cM0, G1, ER+, PR+, HER2-) - Signed by Gardenia Phlegm, NP on 11/06/2020 Stage prefix: Initial diagnosis Histologic grading system: 3 grade system    06/01/2020 Surgery   Re-excision of positive margin [name]): no malignancy identified    07/18/2020 -  Radiation Therapy   Adjuvant radiation   07/2020 -  Anti-estrogen oral therapy   Anastrozole daily     CHIEF COMPLIANT: Follow-up of right breast cancer  INTERVAL HISTORY: Nicole Khan is a 56 y.o. with above-mentioned history of right breast cancer who underwent a right lumpectomy followed by re-excision and is currently on radiation treatment. She presents to the clinic today for follow-up.  Fortunately there was no evidence of invasive cancer.  It was complex sclerosing lesion.  She is currently on anastrozole and tolerating it fairly well.  She does have hot flashes.  ALLERGIES:  is allergic to soy allergy, celery oil, eggs or egg-derived products, hazel tree pollen [corylus], and tape.  MEDICATIONS:  Current Outpatient Medications  Medication Sig Dispense Refill   acetaminophen (TYLENOL) 500 MG tablet Take 1,000 mg by mouth every 6 (six) hours as needed for headache.     anastrozole (ARIMIDEX) 1 MG tablet Take 1 tablet (1  mg total) by mouth daily. 90 tablet 3   atorvastatin (LIPITOR) 40 MG tablet Take 1 tablet (40 mg total) by mouth at bedtime. 90 tablet 3   B Complex-C  (B-COMPLEX WITH VITAMIN C) tablet Take 1 tablet by mouth every evening. (Patient not taking: Reported on 03/16/2021)     buPROPion (WELLBUTRIN XL) 300 MG 24 hr tablet Take 1 tablet (300 mg total) by mouth every morning. 90 tablet 0   citalopram (CELEXA) 20 MG tablet TAKE 1 TABLET BY MOUTH ONCE DAILY. 90 tablet 3   Dulaglutide (TRULICITY) 3 CZ/6.6AY SOPN Inject 3 mg into the skin once a week. 2 mL 11   empagliflozin (JARDIANCE) 10 MG TABS tablet Take 1 tablet (10 mg total) by mouth daily. 90 tablet 3   HYDROcodone-acetaminophen (NORCO/VICODIN) 5-325 MG tablet Take 1 tablet by mouth every 6 (six) hours as needed for moderate pain or severe pain. 10 tablet 0   levocetirizine (XYZAL) 5 MG tablet Take 5 mg by mouth every evening.     metFORMIN (GLUCOPHAGE-XR) 500 MG 24 hr tablet Take 4 tablets (2,000 mg total) by mouth daily with a meal 360 tablet 3   metoprolol tartrate (LOPRESSOR) 100 MG tablet Take 1 tablet (100 mg total) by mouth 2 (two) times daily. 180 tablet 1   phenylephrine (SUDAFED PE) 10 MG TABS tablet Take 10 mg by mouth every 6 (six) hours as needed (for congestion).     spironolactone (ALDACTONE) 100 MG tablet Take 1 tablet (100 mg total) by mouth 2 (two) times daily. 180 tablet 2   No current facility-administered medications for this visit.    PHYSICAL EXAMINATION: ECOG PERFORMANCE STATUS: 1 - Symptomatic but completely ambulatory  Vitals:   05/14/21 1133  BP: 96/65  Pulse: 91  Resp: 19  Temp: (!) 97.3 F (36.3 C)  SpO2: 97%   Filed Weights   05/14/21 1133  Weight: 181 lb (82.1 kg)      LABORATORY DATA:  I have reviewed the data as listed CMP Latest Ref Rng & Units 03/22/2021 06/01/2020 04/17/2020  Glucose 70 - 99 mg/dL 118(H) 104(H) 140(H)  BUN 6 - 20 mg/dL _0 Creatinine 0.44 - 1.00 mg/dL 1.09(H) 1.31(H) 1.00  Sodium 135 - 145 mmol/L 141 136 138  Potassium 3.5 - 5.1 mmol/L 4.3 3.7 4.4  Chloride 98 - 111 mmol/L 106 103 104  CO2 22 - 32 mmol/L 24 20(L) 24   Calcium 8.9 - 10.3 mg/dL 9.8 10.0 9.4  Total Protein 6.5 - 8.1 g/dL - - -  Total Bilirubin 0.3 - 1.2 mg/dL - - -  Alkaline Phos 38 - 126 U/L - - -  AST 15 - 41 U/L - - -  ALT 0 - 44 U/L - - -    Lab Results  Component Value Date   WBC 6.2 03/22/2021   HGB 16.7 (H) 03/22/2021   HCT 50.9 (H) 03/22/2021   MCV 96.8 03/22/2021   PLT 307 03/22/2021   NEUTROABS 5.4 09/24/2017    ASSESSMENT & PLAN:  Malignant neoplasm of upper-outer quadrant of right breast in female, estrogen receptor positive ([place]) 02/29/2020: Screening detected right breast cancer 0.6 cm at 10 o'clock position with multiple benign cysts.  Biopsy 02/29/2020: Grade 1 IDC with DCIS ER/PR greater than 95%, Ki-67: 5%, HER-2 negative T1BN0 stage Ia 04/20/2020: Right lumpectomy: Grade 1 IDC 0.9 cm, intermediate grade DCIS, margins negative, DCIS focally positive posterior margin and less than 1 mm from anterior margin.  0/2  lymph nodes negative, 06/01/2020: Margin excision: Benign   Treatment plan: 1.  Adjuvant radiation therapy 07/18/2020-08/09/2018 2. followed by adjuvant antiestrogen therapy with anastrozole 1 mg daily x5 years started May 2020   Anastrozole toxicities: Hot flashes: They can be quite severe for her.  Breast cancer surveillance: 1.  Breast exam 05/14/2021: Benign 2. mammogram 01/29/2021: 5 mm group of indeterminate calcifications within an asymmetry in the superior right breast Right lumpectomy 03/29/2021: Residual CSL with calcifications Since there is no evidence of DCIS or invasive cancer, we can see her once a year. We will set her up for a bone density test as well.  Return to clinic in 1 year for follow-up    No orders of the defined types were placed in this encounter.  The patient has a good understanding of the overall plan. she agrees with it. she will call with any problems that may develop before the next visit here.  Total time spent: 20 mins including face to face time and time spent  for planning, charting and coordination of care  Rulon Eisenmenger, MD, MPH 05/14/2021  I, Thana Ates, am acting as scribe for Dr. Nicholas Lose.  I have reviewed the above documentation for accuracy and completeness, and I agree with the above.

## 2021-05-14 ENCOUNTER — Inpatient Hospital Stay: Payer: 59 | Attending: Hematology and Oncology | Admitting: Hematology and Oncology

## 2021-05-14 ENCOUNTER — Other Ambulatory Visit: Payer: Self-pay

## 2021-05-14 VITALS — BP 96/65 | HR 91 | Temp 97.3°F | Resp 19 | Ht 61.0 in | Wt 181.0 lb

## 2021-05-14 DIAGNOSIS — Z79899 Other long term (current) drug therapy: Secondary | ICD-10-CM | POA: Insufficient documentation

## 2021-05-14 DIAGNOSIS — Z923 Personal history of irradiation: Secondary | ICD-10-CM | POA: Insufficient documentation

## 2021-05-14 DIAGNOSIS — N6001 Solitary cyst of right breast: Secondary | ICD-10-CM | POA: Diagnosis not present

## 2021-05-14 DIAGNOSIS — R232 Flushing: Secondary | ICD-10-CM | POA: Insufficient documentation

## 2021-05-14 DIAGNOSIS — C50411 Malignant neoplasm of upper-outer quadrant of right female breast: Secondary | ICD-10-CM | POA: Diagnosis not present

## 2021-05-14 DIAGNOSIS — Z780 Asymptomatic menopausal state: Secondary | ICD-10-CM | POA: Diagnosis not present

## 2021-05-14 DIAGNOSIS — Z888 Allergy status to other drugs, medicaments and biological substances status: Secondary | ICD-10-CM | POA: Diagnosis not present

## 2021-05-14 DIAGNOSIS — Z170 Estrogen receptor positive status [ER+]: Secondary | ICD-10-CM | POA: Diagnosis not present

## 2021-05-14 NOTE — Assessment & Plan Note (Signed)
02/29/2020: Screening detected right breast cancer 0.6 cm at 10 o'clock position with multiple benign cysts. Biopsy 02/29/2020: Grade 1 IDC with DCIS ER/PR greater than 95%, Ki-67: 5%, HER-2 negative T1BN0 stage Ia 04/20/2020: Right lumpectomy: Grade 1 IDC 0.9 cm, intermediate grade DCIS, margins negative, DCIS focally positive posterior margin and less than 1 mm from anterior margin. 0/2 lymph nodes negative, 06/01/2020: Margin excision: Benign  Treatment plan: 1.Adjuvant radiation therapy 07/18/2020-08/09/2018 2.followed by adjuvant antiestrogen therapy with anastrozole 1 mg daily x5 years started May 2020  Anastrozole toxicities:  Breast cancer surveillance: 1.  Breast exam 05/14/2021: Benign 2. mammogram 01/29/2021: 5 mm group of indeterminate calcifications within an asymmetry in the superior right breast Right lumpectomy 03/29/2021: Residual CSL with calcifications  Return to clinic in 1 year for follow-up

## 2021-05-15 ENCOUNTER — Telehealth: Payer: Self-pay | Admitting: Hematology and Oncology

## 2021-05-15 NOTE — Telephone Encounter (Signed)
Scheduled appointment per 2/13 los. Patient is aware. Patient will be mailed an updated calendar.

## 2021-05-23 ENCOUNTER — Other Ambulatory Visit (HOSPITAL_COMMUNITY): Payer: Self-pay

## 2021-05-23 DIAGNOSIS — M65331 Trigger finger, right middle finger: Secondary | ICD-10-CM | POA: Diagnosis not present

## 2021-06-05 DIAGNOSIS — E282 Polycystic ovarian syndrome: Secondary | ICD-10-CM | POA: Diagnosis not present

## 2021-06-05 DIAGNOSIS — E119 Type 2 diabetes mellitus without complications: Secondary | ICD-10-CM | POA: Diagnosis not present

## 2021-06-06 ENCOUNTER — Encounter (HOSPITAL_COMMUNITY): Payer: Self-pay

## 2021-06-20 ENCOUNTER — Other Ambulatory Visit (HOSPITAL_COMMUNITY): Payer: Self-pay

## 2021-06-29 ENCOUNTER — Other Ambulatory Visit (HOSPITAL_COMMUNITY): Payer: Self-pay

## 2021-07-04 ENCOUNTER — Other Ambulatory Visit (HOSPITAL_COMMUNITY): Payer: Self-pay

## 2021-07-04 MED ORDER — METOPROLOL TARTRATE 100 MG PO TABS
100.0000 mg | ORAL_TABLET | Freq: Two times a day (BID) | ORAL | 3 refills | Status: DC
  Filled 2021-07-04: qty 180, 90d supply, fill #0

## 2021-07-04 MED ORDER — CITALOPRAM HYDROBROMIDE 20 MG PO TABS
20.0000 mg | ORAL_TABLET | Freq: Every day | ORAL | 3 refills | Status: DC
  Filled 2021-07-04: qty 90, 90d supply, fill #0

## 2021-07-04 MED ORDER — BUPROPION HCL ER (XL) 300 MG PO TB24
300.0000 mg | ORAL_TABLET | Freq: Every morning | ORAL | 3 refills | Status: DC
  Filled 2021-07-04: qty 90, 90d supply, fill #0
  Filled 2021-10-03: qty 90, 90d supply, fill #1
  Filled 2022-01-10: qty 90, 90d supply, fill #2
  Filled 2022-05-01: qty 90, 90d supply, fill #3

## 2021-07-05 ENCOUNTER — Other Ambulatory Visit (HOSPITAL_COMMUNITY): Payer: Self-pay

## 2021-07-23 ENCOUNTER — Ambulatory Visit: Payer: 59 | Attending: Hematology and Oncology

## 2021-07-23 VITALS — Wt 181.4 lb

## 2021-07-23 DIAGNOSIS — Z483 Aftercare following surgery for neoplasm: Secondary | ICD-10-CM | POA: Insufficient documentation

## 2021-07-23 NOTE — Therapy (Signed)
?OUTPATIENT PHYSICAL THERAPY SOZO SCREENING NOTE ? ? ?Patient Name: Nicole Khan ?MRN: [id number] ?DOB:[birth year], 56 y.o., female ?Today's Date: 07/23/2021 ? ?PCP: [name], MD ?REFERRING PROVIDER: [name], MD ? ? PT End of Session - 07/23/21 1032   ? ? Visit Number 4   # unchanged due to screen only  ? PT Start Time 1031   ? PT Stop Time 1034   ? PT Time Calculation (min) 3 min   ? Activity Tolerance Patient tolerated treatment well   ? Behavior During Therapy [facility] for tasks assessed/performed   ? ?  ?  ? ?  ? ? ?Past Medical History:  ?Diagnosis Date  ? Abnormal mammogram of left breast 09/26/2017  ? Abnormal Pap smear of cervix   ? Anxiety   ? Asthma   ? exercise induced asthma  ? Breast cancer ([place])   ? Cancer [facility])   ? Depression   ? Diabetes mellitus without complication ([place])   ? type 2  ? Family history of breast cancer 04/11/2020  ? Family history of prostate cancer 04/11/2020  ? Family history of thyroid cancer 04/11/2020  ? Fracture dislocation of right ankle joint 06/04/2013  ? Fracture of distal fibula 06/04/2013  ? H/O bilateral salpingectomy 05/2015  ? History of kidney stones   ? passed stones, no surgery required  ? Hypercholesteremia   ? Hypertension   ? Personal history of radiation therapy   ? Polycystic ovary disease   ? PONV (postoperative nausea and vomiting)   ? Sleep apnea 05/2017  ? uses cpap  ? SVD (spontaneous vaginal delivery)   ? x 1  ? ?Past Surgical History:  ?Procedure Laterality Date  ? BREAST BIOPSY Right 02/29/2020  ? BREAST EXCISIONAL BIOPSY Left 2019  ? Benign  ? BREAST LUMPECTOMY    ? BREAST LUMPECTOMY WITH RADIOACTIVE SEED AND SENTINEL LYMPH NODE BIOPSY Right 04/20/2020  ? Procedure: RIGHT BREAST LUMPECTOMY WITH RADIOACTIVE SEED AND SENTINEL LYMPH NODE BIOPSY;  Surgeon: Jovita Kussmaul, MD;  Location: [place];  Service: General;  Laterality: Right;  ? BREAST LUMPECTOMY WITH RADIOACTIVE SEED LOCALIZATION Left 09/26/2017  ? Procedure: LEFT BREAST LUMPECTOMY WITH  RADIOACTIVE SEED LOCALIZATION ERAS PATHWAY;  Surgeon: Fanny Skates, MD;  Location: [place];  Service: General;  Laterality: Left;  ? BREAST LUMPECTOMY WITH RADIOACTIVE SEED LOCALIZATION Right 03/29/2021  ? Procedure: RIGHT BREAST LUMPECTOMY WITH RADIOACTIVE SEED LOCALIZATION;  Surgeon: Jovita Kussmaul, MD;  Location: [place];  Service: General;  Laterality: Right;  ? CARPAL TUNNEL RELEASE    ? bilateral  ? CERVICAL CERCLAGE  07/1991; 05/1999  ? x 2  ? CESAREAN SECTION    ? x 1  ? CHOLECYSTECTOMY, LAPAROSCOPIC  1996  ? COLONOSCOPY WITH PROPOFOL N/A 06/24/2017  ? Procedure: COLONOSCOPY WITH PROPOFOL;  Surgeon: Juanita Craver, MD;  [facility];  Service: Endoscopy;  Laterality: N/A;  ? CYSTOSCOPY N/A 06/07/2014  ? Procedure: CYSTOSCOPY;  Surgeon: Megan Salon, MD;  Location: [facility];  Service: Gynecology;  Laterality: N/A;  ? LAPAROSCOPIC BILATERAL SALPINGO OOPHERECTOMY Right 06/07/2014  ? Procedure: LAPAROSCOPIC RIGHT SALPINGO OOPHORECTOMY/COLLECTION OF PELVIC WASHINGS;  Surgeon: Megan Salon, MD;  Location: [facility];  Service: Gynecology;  Laterality: Right;  ? LAPAROSCOPIC UNILATERAL SALPINGECTOMY Left 06/07/2014  ? Procedure: LAPAROSCOPIC UNILATERAL SALPINGECTOMY;  Surgeon: Megan Salon, MD;  Location: [facility];  Service: Gynecology;  Laterality: Left;  ? ORIF ANKLE FRACTURE Right 06/07/2013  ? Procedure: OPEN REDUCTION INTERNAL FIXATION (ORIF)  ANKLE FRACTURE  FIBULA SYNDESMOSIS;  Surgeon: Ninetta Lights, MD;  Location: [place];  Service: Orthopedics;  Laterality: Right;  ? RE-EXCISION OF BREAST CANCER,SUPERIOR MARGINS Right 06/01/2020  ? Procedure: RIGHT BREAST MARGIN REEXCISION;  Surgeon: Jovita Kussmaul, MD;  Location: [place];  Service: General;  Laterality: Right;  ? ?Patient Active Problem List  ? Diagnosis Date Noted  ? Allergic rhinitis 11/08/2020  ? Exercise induced bronchospasm 11/08/2020  ? Genetic testing 04/17/2020  ? Family history of breast cancer 04/11/2020  ?  Family history of prostate cancer 04/11/2020  ? Family history of thyroid cancer 04/11/2020  ? Malignant neoplasm of upper-outer quadrant of right breast in female, estrogen receptor positive ([place]) 03/21/2020  ? Abnormal mammogram of left breast 09/26/2017  ? Bilateral ovarian cysts 05/23/2014  ? Fracture of distal fibula 06/04/2013  ? HTN (hypertension) 04/07/2013  ? PCOS (polycystic ovarian syndrome) 04/07/2013  ? Renal calculi 04/07/2013  ? Adjustment disorder with mixed anxiety and depressed mood 04/07/2013  ? ? ?REFERRING DIAG: right breast cancer at risk for lymphedema ? ?THERAPY DIAG:  ?Aftercare following surgery for neoplasm ? ?PERTINENT HISTORY: right breast cancer diagnosed with bio Pt had reexcision on June 01, 2020.  biopsy on 02/29/20 showed invasive ductal carcinoma with DCIS, grade 1, HER-2 negative (1+), ER/PR >95%, Ki 67 5%. Surgery is scheduled for Jan 20 and she will have radiation.  Past history includes, DM, sleep apnea, bilaterl carpal tunnel surgery( 2006) and right trigger finger surgery ( 2011)  ? ?PRECAUTIONS: right UE Lymphedema risk, None ? ?SUBJECTIVE: Pt returns for her 3 month L-Dex screen.  ? ?PAIN:  ?Are you having pain? No ? ?SOZO SCREENING: ?Patient was assessed today using the SOZO machine to determine the lymphedema index score. This was compared to her baseline score. It was determined that she is within the recommended range when compared to her baseline and no further action is needed at this time. She will continue SOZO screenings. These are done every 3 months for 2 years post operatively followed by every 6 months for 2 years, and then annually. ? ? ? ?Otelia Limes, PTA ?07/23/2021, 10:34 AM ? ?  ? ?

## 2021-07-24 ENCOUNTER — Encounter (HOSPITAL_COMMUNITY): Payer: Self-pay

## 2021-08-08 ENCOUNTER — Other Ambulatory Visit (HOSPITAL_COMMUNITY): Payer: Self-pay

## 2021-08-09 DIAGNOSIS — H5213 Myopia, bilateral: Secondary | ICD-10-CM | POA: Diagnosis not present

## 2021-08-21 ENCOUNTER — Other Ambulatory Visit (HOSPITAL_COMMUNITY): Payer: Self-pay

## 2021-09-10 ENCOUNTER — Other Ambulatory Visit (HOSPITAL_COMMUNITY): Payer: Self-pay

## 2021-09-20 NOTE — Progress Notes (Unsigned)
56 y.o. [id number] Married Caucasian female here for annual exam.    PCP:  [name], MD   Patient's last menstrual period was 12/31/2015 (within weeks).           Sexually active: {yes no:314532}  The current method of family planning is vasectomy.    Exercising: {yes no:314532}  {types:19826} Smoker:  no  Health Maintenance: Pap:  09-21-20 Neg:Neg HR HPV, 06-08-19 LGSIL:Neg HR HPV, 04-14-14 Neg:Neg HR HPV History of abnormal Pap:  yes, remote past. Follow up normal. MMG:  SEE EPIC Colonoscopy:   05/2017 polyps;next 5 years BMD: 2017  Result :normal w/[place] TDaP:  2015 Gardasil:   no HIV: Neg in the past Hep C: no Screening Labs:  Hb today: ***, Urine today: ***   reports that she has never smoked. She has never used smokeless tobacco. She reports that she does not drink alcohol and does not use drugs.  Past Medical History:  Diagnosis Date   Abnormal mammogram of left breast 09/26/2017   Abnormal Pap smear of cervix    Anxiety    Asthma    exercise induced asthma   Breast cancer ([place])    Cancer ([place])    Depression    Diabetes mellitus without complication ([place])    type 2   Family history of breast cancer 04/11/2020   Family history of prostate cancer 04/11/2020   Family history of thyroid cancer 04/11/2020   Fracture dislocation of right ankle joint 06/04/2013   Fracture of distal fibula 06/04/2013   H/O bilateral salpingectomy 05/2015   History of kidney stones    passed stones, no surgery required   Hypercholesteremia    Hypertension    Personal history of radiation therapy    Polycystic ovary disease    PONV (postoperative nausea and vomiting)    Sleep apnea 05/2017   uses cpap   SVD (spontaneous vaginal delivery)    x 1    Past Surgical History:  Procedure Laterality Date   BREAST BIOPSY Right 02/29/2020   BREAST EXCISIONAL BIOPSY Left 2019   Benign   BREAST LUMPECTOMY     BREAST LUMPECTOMY WITH RADIOACTIVE SEED AND SENTINEL LYMPH NODE BIOPSY Right  04/20/2020   Procedure: RIGHT BREAST LUMPECTOMY WITH RADIOACTIVE SEED AND SENTINEL LYMPH NODE BIOPSY;  Surgeon: Jovita Kussmaul, MD;  Location: [place];  Service: General;  Laterality: Right;   BREAST LUMPECTOMY WITH RADIOACTIVE SEED LOCALIZATION Left 09/26/2017   Procedure: LEFT BREAST LUMPECTOMY WITH RADIOACTIVE SEED LOCALIZATION ERAS PATHWAY;  Surgeon: Fanny Skates, MD;  Location: [place];  Service: General;  Laterality: Left;   BREAST LUMPECTOMY WITH RADIOACTIVE SEED LOCALIZATION Right 03/29/2021   Procedure: RIGHT BREAST LUMPECTOMY WITH RADIOACTIVE SEED LOCALIZATION;  Surgeon: Jovita Kussmaul, MD;  Location: [place];  Service: General;  Laterality: Right;   CARPAL TUNNEL RELEASE     bilateral   CERVICAL CERCLAGE  07/1991; 05/1999   x 2   CESAREAN SECTION     x 1   CHOLECYSTECTOMY, LAPAROSCOPIC  1996   COLONOSCOPY WITH PROPOFOL N/A 06/24/2017   Procedure: COLONOSCOPY WITH PROPOFOL;  Surgeon: Juanita Craver, MD;  [facility];  Service: Endoscopy;  Laterality: N/A;   CYSTOSCOPY N/A 06/07/2014   Procedure: CYSTOSCOPY;  Surgeon: Megan Salon, MD;  Location: [facility];  Service: Gynecology;  Laterality: N/A;   LAPAROSCOPIC BILATERAL SALPINGO OOPHERECTOMY Right 06/07/2014   Procedure: LAPAROSCOPIC RIGHT SALPINGO OOPHORECTOMY/COLLECTION OF PELVIC WASHINGS;  Surgeon: Megan Salon, MD;  Location: [facility];  Service: Gynecology;  Laterality: Right;   LAPAROSCOPIC UNILATERAL SALPINGECTOMY Left 06/07/2014   Procedure: LAPAROSCOPIC UNILATERAL SALPINGECTOMY;  Surgeon: Megan Salon, MD;  Location: [facility];  Service: Gynecology;  Laterality: Left;   ORIF ANKLE FRACTURE Right 06/07/2013   Procedure: OPEN REDUCTION INTERNAL FIXATION (ORIF) ANKLE FRACTURE  FIBULA SYNDESMOSIS;  Surgeon: Ninetta Lights, MD;  Location: [place];  Service: Orthopedics;  Laterality: Right;   RE-EXCISION OF BREAST CANCER,SUPERIOR MARGINS Right 06/01/2020   Procedure: RIGHT BREAST MARGIN  REEXCISION;  Surgeon: [name], MD;  Location: [place];  Service: General;  Laterality: Right;    Current Outpatient Medications  Medication Sig Dispense Refill   acetaminophen (TYLENOL) 500 MG tablet Take 1,000 mg by mouth every 6 (six) hours as needed for headache.     anastrozole (ARIMIDEX) 1 MG tablet Take 1 tablet (1 mg total) by mouth daily. 90 tablet 3   atorvastatin (LIPITOR) 40 MG tablet Take 1 tablet (40 mg total) by mouth at bedtime. 90 tablet 3   buPROPion (WELLBUTRIN XL) 300 MG 24 hr tablet Take 1 tablet (300 mg total) by mouth in the morning. 90 tablet 3   citalopram (CELEXA) 20 MG tablet TAKE 1 TABLET BY MOUTH ONCE DAILY. 90 tablet 3   citalopram (CELEXA) 20 MG tablet Take 1 tablet (20 mg total) by mouth daily. 90 tablet 3   Dulaglutide (TRULICITY) 3 XT/0.5WP SOPN Inject 3 mg into the skin once a week. 2 mL 11   empagliflozin (JARDIANCE) 10 MG TABS tablet Take 1 tablet (10 mg total) by mouth daily. 90 tablet 3   levocetirizine (XYZAL) 5 MG tablet Take 5 mg by mouth every evening.     metFORMIN (GLUCOPHAGE-XR) 500 MG 24 hr tablet Take 4 tablets (2,000 mg total) by mouth daily with a meal 360 tablet 3   metoprolol tartrate (LOPRESSOR) 100 MG tablet Take 1 tablet (100 mg total) by mouth 2 (two) times daily. 180 tablet 3   phenylephrine (SUDAFED PE) 10 MG TABS tablet Take 10 mg by mouth every 6 (six) hours as needed (for congestion).     spironolactone (ALDACTONE) 100 MG tablet Take 1 tablet (100 mg total) by mouth 2 (two) times daily. 180 tablet 2   No current facility-administered medications for this visit.    Family History  Problem Relation Age of Onset   Hypertension Mother    Infertility Mother    Deep vein thrombosis Mother    Anxiety disorder Mother    Depression Mother    Hepatitis C Mother        blood transfusion   Aneurysm Mother        Japanese   Cancer Father        Gallbladder; dx 24s   Hypertension Father    Anxiety disorder Father    Depression  Father    Multiple births Sister    Thyroid disease Sister    Anxiety disorder Sister    Depression Sister    Breast cancer Sister 69   Thyroid cancer Sister 50       unknown type   Hypertension Brother    Anxiety disorder Brother    Depression Brother    Prostate cancer Paternal Uncle        dx 47s   Lung cancer Half-Sister 33       maternal half sister; no smoking hx    Review of Systems  Exam:   LMP 12/31/2015 (Within Weeks)  General appearance: alert, cooperative and appears stated age Head: normocephalic, without obvious abnormality, atraumatic Neck: no adenopathy, supple, symmetrical, trachea midline and thyroid normal to inspection and palpation Lungs: clear to auscultation bilaterally Breasts: normal appearance, no masses or tenderness, No nipple retraction or dimpling, No nipple discharge or bleeding, No axillary adenopathy Heart: regular rate and rhythm Abdomen: soft, non-tender; no masses, no organomegaly Extremities: extremities normal, atraumatic, no cyanosis or edema Skin: skin color, texture, turgor normal. No rashes or lesions Lymph nodes: cervical, supraclavicular, and axillary nodes normal. Neurologic: grossly normal  Pelvic: External genitalia:  no lesions              No abnormal inguinal nodes palpated.              Urethra:  normal appearing urethra with no masses, tenderness or lesions              Bartholins and Skenes: normal                 Vagina: normal appearing vagina with normal color and discharge, no lesions              Cervix: no lesions              Pap taken: {yes no:314532} Bimanual Exam:  Uterus:  normal size, contour, position, consistency, mobility, non-tender              Adnexa: no mass, fullness, tenderness              Rectal exam: {yes no:314532}.  Confirms.              Anus:  normal sphincter tone, no lesions  Chaperone was present for exam:  ***  Assessment:   Well woman visit with gynecologic  exam.   Plan: Mammogram screening discussed. Self breast awareness reviewed. Pap and HR HPV as above. Guidelines for Calcium, Vitamin D, regular exercise program including cardiovascular and weight bearing exercise.   Follow up annually and prn.   Additional counseling given.  {yes [id number]. _______ minutes face to face time of which over 50% was spent in counseling.    After visit summary provided.

## 2021-09-24 ENCOUNTER — Other Ambulatory Visit (HOSPITAL_COMMUNITY)
Admission: RE | Admit: 2021-09-24 | Discharge: 2021-09-24 | Disposition: A | Discharge status: Home / Self Care | Payer: 59 | Source: Ambulatory Visit | Attending: Obstetrics and Gynecology | Admitting: Obstetrics and Gynecology

## 2021-09-24 ENCOUNTER — Encounter: Payer: Self-pay | Admitting: Obstetrics and Gynecology

## 2021-09-24 ENCOUNTER — Other Ambulatory Visit (HOSPITAL_COMMUNITY): Payer: Self-pay

## 2021-09-24 ENCOUNTER — Ambulatory Visit (INDEPENDENT_AMBULATORY_CARE_PROVIDER_SITE_OTHER): Payer: 59 | Admitting: Obstetrics and Gynecology

## 2021-09-24 VITALS — BP 114/62 | HR 74 | Ht 61.5 in | Wt 183.0 lb

## 2021-09-24 DIAGNOSIS — I10 Essential (primary) hypertension: Secondary | ICD-10-CM | POA: Diagnosis not present

## 2021-09-24 DIAGNOSIS — Z01419 Encounter for gynecological examination (general) (routine) without abnormal findings: Secondary | ICD-10-CM

## 2021-09-24 DIAGNOSIS — Z124 Encounter for screening for malignant neoplasm of cervix: Secondary | ICD-10-CM | POA: Diagnosis not present

## 2021-09-24 MED ORDER — SPIRONOLACTONE 100 MG PO TABS
100.0000 mg | ORAL_TABLET | Freq: Two times a day (BID) | ORAL | 3 refills | Status: DC
  Filled 2021-09-24 – 2021-11-13 (×2): qty 180, 90d supply, fill #0
  Filled 2022-02-15: qty 180, 90d supply, fill #1
  Filled 2022-05-29: qty 180, 90d supply, fill #2

## 2021-09-26 LAB — CYTOLOGY - PAP
Comment: NEGATIVE
Diagnosis: UNDETERMINED — AB
High risk HPV: NEGATIVE

## 2021-10-03 ENCOUNTER — Other Ambulatory Visit (HOSPITAL_COMMUNITY): Payer: Self-pay

## 2021-10-03 ENCOUNTER — Other Ambulatory Visit: Payer: Self-pay | Admitting: Hematology and Oncology

## 2021-10-03 MED ORDER — ANASTROZOLE 1 MG PO TABS
1.0000 mg | ORAL_TABLET | Freq: Every day | ORAL | 3 refills | Status: DC
  Filled 2021-10-03: qty 90, 90d supply, fill #0
  Filled 2022-01-10: qty 90, 90d supply, fill #1
  Filled 2022-04-10: qty 90, 90d supply, fill #2

## 2021-10-05 ENCOUNTER — Other Ambulatory Visit (HOSPITAL_COMMUNITY): Payer: Self-pay

## 2021-10-08 ENCOUNTER — Other Ambulatory Visit (HOSPITAL_COMMUNITY): Payer: Self-pay

## 2021-10-08 MED ORDER — CITALOPRAM HYDROBROMIDE 20 MG PO TABS
20.0000 mg | ORAL_TABLET | Freq: Every day | ORAL | 1 refills | Status: DC
  Filled 2021-10-08: qty 90, 90d supply, fill #0
  Filled 2022-01-31: qty 90, 90d supply, fill #1

## 2021-10-09 ENCOUNTER — Other Ambulatory Visit (HOSPITAL_COMMUNITY): Payer: Self-pay

## 2021-10-09 MED ORDER — METOPROLOL TARTRATE 100 MG PO TABS
100.0000 mg | ORAL_TABLET | Freq: Two times a day (BID) | ORAL | 3 refills | Status: DC
  Filled 2021-10-09: qty 180, 90d supply, fill #0
  Filled 2022-01-10: qty 180, 90d supply, fill #1
  Filled 2022-04-10: qty 180, 90d supply, fill #2
  Filled 2022-08-02: qty 180, 90d supply, fill #3

## 2021-10-26 ENCOUNTER — Ambulatory Visit
Admission: RE | Admit: 2021-10-26 | Discharge: 2021-10-26 | Disposition: A | Discharge status: Home / Self Care | Payer: 59 | Source: Ambulatory Visit | Attending: Hematology and Oncology | Admitting: Hematology and Oncology

## 2021-10-26 DIAGNOSIS — Z780 Asymptomatic menopausal state: Secondary | ICD-10-CM | POA: Diagnosis not present

## 2021-10-26 DIAGNOSIS — M85851 Other specified disorders of bone density and structure, right thigh: Secondary | ICD-10-CM | POA: Diagnosis not present

## 2021-10-29 ENCOUNTER — Telehealth: Payer: Self-pay

## 2021-10-29 NOTE — Telephone Encounter (Signed)
-----   Message from Gardenia Phlegm, NP sent at 10/29/2021  8:10 AM EDT ----- Please call patient with bone density testing results.  She has mild osteopenia.  Recommend calcium, vitamin D, weightbearing exercises and repeat testing in 2 years. ----- Message ----- From: Interface, Rad Results In Sent: 10/26/2021   3:52 PM EDT To: Nicholas Lose, MD

## 2021-10-29 NOTE — Telephone Encounter (Signed)
Called pt with bone density results as below.  Pt verbalized understanding and was told to call with any concerns or questions.

## 2021-11-01 ENCOUNTER — Other Ambulatory Visit (HOSPITAL_COMMUNITY): Payer: Self-pay

## 2021-11-13 ENCOUNTER — Other Ambulatory Visit (HOSPITAL_COMMUNITY): Payer: Self-pay

## 2021-11-13 MED ORDER — METFORMIN HCL ER 500 MG PO TB24
2000.0000 mg | ORAL_TABLET | Freq: Every day | ORAL | 3 refills | Status: DC
Start: 2021-11-13 — End: 2022-08-09
  Filled 2021-11-13: qty 360, 90d supply, fill #0
  Filled 2022-02-28: qty 360, 90d supply, fill #1
  Filled 2022-06-25 – 2022-07-03 (×2): qty 360, 90d supply, fill #2

## 2021-11-13 MED ORDER — ATORVASTATIN CALCIUM 40 MG PO TABS
40.0000 mg | ORAL_TABLET | Freq: Every evening | ORAL | 2 refills | Status: DC
Start: 2021-11-13 — End: 2022-08-09
  Filled 2021-11-13: qty 90, 90d supply, fill #0
  Filled 2022-02-28: qty 90, 90d supply, fill #1
  Filled 2022-05-29: qty 90, 90d supply, fill #2

## 2021-11-26 ENCOUNTER — Ambulatory Visit: Payer: 59 | Attending: Hematology and Oncology

## 2021-11-26 VITALS — Wt 181.4 lb

## 2021-11-26 DIAGNOSIS — Z483 Aftercare following surgery for neoplasm: Secondary | ICD-10-CM | POA: Insufficient documentation

## 2021-11-26 NOTE — Therapy (Signed)
OUTPATIENT PHYSICAL THERAPY SOZO SCREENING NOTE   Patient Name: Nicole Khan MRN: [id number] DOB:[birth year], 56 y.o., female Today's Date: 11/26/2021  PCP: [name], MD REFERRING PROVIDER: [name], MD   PT End of Session - 11/26/21 1051     Visit Number 4   # unchanged due to screen only   PT Start Time 1049    PT Stop Time 2979    PT Time Calculation (min) 4 min    Activity Tolerance Patient tolerated treatment well    Behavior During Therapy [facility] for tasks assessed/performed             Past Medical History:  Diagnosis Date   Abnormal mammogram of left breast 09/26/2017   Abnormal Pap smear of cervix    Anxiety    Asthma    exercise induced asthma   Breast cancer ([place])    Cancer ([place])    Depression    Diabetes mellitus without complication ([place])    type 2   Family history of breast cancer 04/11/2020   Family history of prostate cancer 04/11/2020   Family history of thyroid cancer 04/11/2020   Fracture dislocation of right ankle joint 06/04/2013   Fracture of distal fibula 06/04/2013   H/O bilateral salpingectomy 05/2015   History of kidney stones    passed stones, no surgery required   Hypercholesteremia    Hypertension    Personal history of radiation therapy    Polycystic ovary disease    PONV (postoperative nausea and vomiting)    Sleep apnea 05/2017   uses cpap   SVD (spontaneous vaginal delivery)    x 1   Past Surgical History:  Procedure Laterality Date   BREAST BIOPSY Right 02/29/2020   BREAST EXCISIONAL BIOPSY Left 2019   Benign   BREAST LUMPECTOMY     BREAST LUMPECTOMY WITH RADIOACTIVE SEED AND SENTINEL LYMPH NODE BIOPSY Right 04/20/2020   Procedure: RIGHT BREAST LUMPECTOMY WITH RADIOACTIVE SEED AND SENTINEL LYMPH NODE BIOPSY;  Surgeon: Jovita Kussmaul, MD;  Location: [place];  Service: General;  Laterality: Right;   BREAST LUMPECTOMY WITH RADIOACTIVE SEED LOCALIZATION Left 09/26/2017   Procedure: LEFT BREAST LUMPECTOMY [place];  Surgeon: Fanny Skates, MD;  Location: [place];  Service: General;  Laterality: Left;   BREAST LUMPECTOMY WITH RADIOACTIVE SEED LOCALIZATION Right 03/29/2021   Procedure: RIGHT BREAST LUMPECTOMY WITH RADIOACTIVE SEED LOCALIZATION;  Surgeon: Jovita Kussmaul, MD;  Location: [place];  Service: General;  Laterality: Right;   CARPAL TUNNEL RELEASE     bilateral   CERVICAL CERCLAGE  07/1991; 05/1999   x 2   CESAREAN SECTION     x 1   CHOLECYSTECTOMY, LAPAROSCOPIC  1996   COLONOSCOPY WITH PROPOFOL N/A 06/24/2017   Procedure: COLONOSCOPY WITH PROPOFOL;  Surgeon: Juanita Craver, MD;  [facility];  Service: Endoscopy;  Laterality: N/A;   CYSTOSCOPY N/A 06/07/2014   Procedure: CYSTOSCOPY;  Surgeon: Megan Salon, MD;  Location: [facility];  Service: Gynecology;  Laterality: N/A;   LAPAROSCOPIC BILATERAL SALPINGO OOPHERECTOMY Right 06/07/2014   Procedure: LAPAROSCOPIC RIGHT SALPINGO OOPHORECTOMY/COLLECTION OF PELVIC WASHINGS;  Surgeon: Megan Salon, MD;  Location: [facility];  Service: Gynecology;  Laterality: Right;   LAPAROSCOPIC UNILATERAL SALPINGECTOMY Left 06/07/2014   Procedure: LAPAROSCOPIC UNILATERAL SALPINGECTOMY;  Surgeon: Megan Salon, MD;  Location: [facility];  Service: Gynecology;  Laterality: Left;   ORIF ANKLE FRACTURE Right 06/07/2013   Procedure: OPEN REDUCTION INTERNAL FIXATION (ORIF)  ANKLE FRACTURE  FIBULA SYNDESMOSIS;  Surgeon: Ninetta Lights, MD;  Location: [place];  Service: Orthopedics;  Laterality: Right;   RE-EXCISION OF BREAST CANCER,SUPERIOR MARGINS Right 06/01/2020   Procedure: RIGHT BREAST MARGIN REEXCISION;  Surgeon: Jovita Kussmaul, MD;  Location: [place];  Service: General;  Laterality: Right;   TRIGGER FINGER RELEASE Right 01/30/2021   Patient Active Problem List   Diagnosis Date Noted   Allergic rhinitis 11/08/2020   Exercise induced bronchospasm 11/08/2020   Genetic testing 04/17/2020    Family history of breast cancer 04/11/2020   Family history of prostate cancer 04/11/2020   Family history of thyroid cancer 04/11/2020   Malignant neoplasm of upper-outer quadrant of right breast in female, estrogen receptor positive ([place]) 03/21/2020   Abnormal mammogram of left breast 09/26/2017   Bilateral ovarian cysts 05/23/2014   Fracture of distal fibula 06/04/2013   HTN (hypertension) 04/07/2013   PCOS (polycystic ovarian syndrome) 04/07/2013   Renal calculi 04/07/2013   Adjustment disorder with mixed anxiety and depressed mood 04/07/2013    REFERRING DIAG: right breast cancer at risk for lymphedema  THERAPY DIAG: Aftercare following surgery for neoplasm  PERTINENT HISTORY: right breast cancer diagnosed with bio Pt had reexcision on June 01, 2020.  biopsy on 02/29/20 showed invasive ductal carcinoma with DCIS, grade 1, HER-2 negative (1+), ER/PR >95%, Ki 67 5%. Surgery is scheduled for Jan 20 and she will have radiation.  Past history includes, DM, sleep apnea, bilaterl carpal tunnel surgery( 2006) and right trigger finger surgery ( 2011)   PRECAUTIONS: right UE Lymphedema risk, None  SUBJECTIVE: Pt returns for her 3 month L-Dex screen.   PAIN:  Are you having pain? No  SOZO SCREENING: Patient was assessed today using the SOZO machine to determine the lymphedema index score. This was compared to her baseline score. It was determined that she is within the recommended range when compared to her baseline and no further action is needed at this time. She will continue SOZO screenings. These are done every 3 months for 2 years post operatively followed by every 6 months for 2 years, and then annually.   L-DEX FLOWSHEETS - 11/26/21 1000       L-DEX LYMPHEDEMA SCREENING   Measurement Type Unilateral    L-DEX MEASUREMENT EXTREMITY Upper Extremity    POSITION  Standing    DOMINANT SIDE Right    At Risk Side Right    BASELINE SCORE (UNILATERAL) 1.8    L-DEX SCORE (UNILATERAL)  -6.8    VALUE CHANGE (UNILAT) -8.6              Otelia Limes, PTA 11/26/2021, 10:53 AM

## 2021-12-05 ENCOUNTER — Other Ambulatory Visit (HOSPITAL_COMMUNITY): Payer: Self-pay

## 2021-12-05 DIAGNOSIS — E282 Polycystic ovarian syndrome: Secondary | ICD-10-CM | POA: Diagnosis not present

## 2021-12-05 DIAGNOSIS — E119 Type 2 diabetes mellitus without complications: Secondary | ICD-10-CM | POA: Diagnosis not present

## 2021-12-05 MED ORDER — TRULICITY 3 MG/0.5ML ~~LOC~~ SOAJ
3.0000 mg | SUBCUTANEOUS | 4 refills | Status: DC
Start: 2021-12-05 — End: 2022-08-09
  Filled 2021-12-05: qty 6, 84d supply, fill #0
  Filled 2022-02-28: qty 6, 84d supply, fill #1
  Filled 2022-05-29: qty 6, 84d supply, fill #2

## 2022-01-10 ENCOUNTER — Other Ambulatory Visit (HOSPITAL_COMMUNITY): Payer: Self-pay

## 2022-01-22 ENCOUNTER — Other Ambulatory Visit: Payer: Self-pay | Admitting: Adult Health

## 2022-01-22 DIAGNOSIS — Z1231 Encounter for screening mammogram for malignant neoplasm of breast: Secondary | ICD-10-CM

## 2022-01-31 ENCOUNTER — Other Ambulatory Visit (HOSPITAL_COMMUNITY): Payer: Self-pay

## 2022-02-11 ENCOUNTER — Ambulatory Visit (HOSPITAL_BASED_OUTPATIENT_CLINIC_OR_DEPARTMENT_OTHER): Payer: 59 | Admitting: Nurse Practitioner

## 2022-02-15 ENCOUNTER — Other Ambulatory Visit (HOSPITAL_COMMUNITY): Payer: Self-pay

## 2022-02-25 ENCOUNTER — Ambulatory Visit: Payer: 59 | Attending: Hematology and Oncology

## 2022-02-25 VITALS — Wt 179.1 lb

## 2022-02-25 DIAGNOSIS — Z483 Aftercare following surgery for neoplasm: Secondary | ICD-10-CM | POA: Insufficient documentation

## 2022-02-25 NOTE — Therapy (Signed)
OUTPATIENT PHYSICAL THERAPY [facility] SCREENING NOTE   Patient Name: Nicole Khan MRN: [id number] DOB:1966/01/22, 56 y.o., female Today's Date: 02/25/2022  PCP: [name], MD REFERRING PROVIDER: [name], MD   PT End of Session - 02/25/22 1622     Visit Number 4   # unchanged due to screen only   PT Start Time 1620    PT Stop Time 1624    PT Time Calculation (min) 4 min    Activity Tolerance Patient tolerated treatment well    Behavior During Therapy [facility] for tasks assessed/performed             Past Medical History:  Diagnosis Date   Abnormal mammogram of left breast 09/26/2017   Abnormal Pap smear of cervix    Anxiety    Asthma    exercise induced asthma   Breast cancer ([place])    Cancer ([place])    Depression    Diabetes mellitus without complication ([place])    type 2   Family history of breast cancer 04/11/2020   Family history of prostate cancer 04/11/2020   Family history of thyroid cancer 04/11/2020   Fracture dislocation of right ankle joint 06/04/2013   Fracture of distal fibula 06/04/2013   H/O bilateral salpingectomy 05/2015   History of kidney stones    passed stones, no surgery required   Hypercholesteremia    Hypertension    Personal history of radiation therapy    Polycystic ovary disease    PONV (postoperative nausea and vomiting)    Sleep apnea 05/2017   uses cpap   SVD (spontaneous vaginal delivery)    x 1   Past Surgical History:  Procedure Laterality Date   BREAST BIOPSY Right 02/29/2020   BREAST EXCISIONAL BIOPSY Left 2019   Benign   BREAST LUMPECTOMY     BREAST LUMPECTOMY WITH RADIOACTIVE SEED AND SENTINEL LYMPH NODE BIOPSY Right 04/20/2020   Procedure: RIGHT BREAST LUMPECTOMY WITH RADIOACTIVE SEED AND SENTINEL LYMPH NODE BIOPSY;  Surgeon: Jovita Kussmaul, MD;  Location: [facility];  Service: General;  Laterality: Right;   BREAST LUMPECTOMY WITH RADIOACTIVE SEED LOCALIZATION Left 09/26/2017   Procedure: LEFT BREAST LUMPECTOMY [place];  Surgeon: Fanny Skates, MD;  Location: [facility];  Service: General;  Laterality: Left;   BREAST LUMPECTOMY WITH RADIOACTIVE SEED LOCALIZATION Right 03/29/2021   Procedure: RIGHT BREAST LUMPECTOMY WITH RADIOACTIVE SEED LOCALIZATION;  Surgeon: Jovita Kussmaul, MD;  Location: [facility];  Service: General;  Laterality: Right;   CARPAL TUNNEL RELEASE     bilateral   CERVICAL CERCLAGE  07/1991; 05/1999   x 2   CESAREAN SECTION     x 1   CHOLECYSTECTOMY, LAPAROSCOPIC  1996   COLONOSCOPY WITH PROPOFOL N/A 06/24/2017   Procedure: COLONOSCOPY WITH PROPOFOL;  Surgeon: Juanita Craver, MD;  Location: [facility];  Service: Endoscopy;  Laterality: N/A;   CYSTOSCOPY N/A 06/07/2014   Procedure: CYSTOSCOPY;  Surgeon: Megan Salon, MD;  Location: [facility];  Service: Gynecology;  Laterality: N/A;   LAPAROSCOPIC BILATERAL SALPINGO OOPHERECTOMY Right 06/07/2014   Procedure: LAPAROSCOPIC RIGHT SALPINGO OOPHORECTOMY/COLLECTION OF PELVIC WASHINGS;  Surgeon: Megan Salon, MD;  Location: [facility];  Service: Gynecology;  Laterality: Right;   LAPAROSCOPIC UNILATERAL SALPINGECTOMY Left 06/07/2014   Procedure: LAPAROSCOPIC UNILATERAL SALPINGECTOMY;  Surgeon: Megan Salon, MD;  Location: [facility];  Service: Gynecology;  Laterality: Left;   ORIF ANKLE FRACTURE Right 06/07/2013   Procedure: OPEN REDUCTION INTERNAL FIXATION (ORIF)  ANKLE FRACTURE  FIBULA SYNDESMOSIS;  Surgeon: Ninetta Lights, MD;  Location: [facility];  Service: Orthopedics;  Laterality: Right;   RE-EXCISION OF BREAST CANCER,SUPERIOR MARGINS Right 06/01/2020   Procedure: RIGHT BREAST MARGIN REEXCISION;  Surgeon: Jovita Kussmaul, MD;  Location: [facility];  Service: General;  Laterality: Right;   TRIGGER FINGER RELEASE Right 01/30/2021   Patient Active Problem List   Diagnosis Date Noted   Allergic rhinitis 11/08/2020   Exercise induced bronchospasm 11/08/2020   Genetic testing 04/17/2020    Family history of breast cancer 04/11/2020   Family history of prostate cancer 04/11/2020   Family history of thyroid cancer 04/11/2020   Malignant neoplasm of upper-outer quadrant of right breast in female, estrogen receptor positive ([place]) 03/21/2020   Abnormal mammogram of left breast 09/26/2017   Bilateral ovarian cysts 05/23/2014   Fracture of distal fibula 06/04/2013   HTN (hypertension) 04/07/2013   PCOS (polycystic ovarian syndrome) 04/07/2013   Renal calculi 04/07/2013   Adjustment disorder with mixed anxiety and depressed mood 04/07/2013    REFERRING DIAG: right breast cancer at risk for lymphedema  THERAPY DIAG: Aftercare following surgery for neoplasm  PERTINENT HISTORY: right breast cancer diagnosed with bio Pt had reexcision on June 01, 2020.  biopsy on 02/29/20 showed invasive ductal carcinoma with DCIS, grade 1, HER-2 negative (1+), ER/PR >95%, Ki 67 5%. Surgery is scheduled for Jan 20 and she will have radiation.  Past history includes, DM, sleep apnea, bilaterl carpal tunnel surgery( 2006) and right trigger finger surgery ( 2011)   PRECAUTIONS: right UE Lymphedema risk, None  SUBJECTIVE: Pt returns for her 3 month L-Dex screen.   PAIN:  Are you having pain? No  SOZO SCREENING: Patient was assessed today using the SOZO machine to determine the lymphedema index score. This was compared to her baseline score. It was determined that she is within the recommended range when compared to her baseline and no further action is needed at this time. She will continue SOZO screenings. These are done every 3 months for 2 years post operatively followed by every 6 months for 2 years, and then annually.   L-DEX FLOWSHEETS - 02/25/22 1600       L-DEX LYMPHEDEMA SCREENING   Measurement Type Unilateral    L-DEX MEASUREMENT EXTREMITY Upper Extremity    POSITION  Standing    DOMINANT SIDE Right    At Risk Side Right    BASELINE SCORE (UNILATERAL) 1.8    L-DEX SCORE (UNILATERAL)  -2.4    VALUE CHANGE (UNILAT) -4.2              Otelia Limes, PTA 02/25/2022, 4:23 PM

## 2022-02-28 ENCOUNTER — Other Ambulatory Visit (HOSPITAL_COMMUNITY): Payer: Self-pay

## 2022-03-15 ENCOUNTER — Ambulatory Visit
Admission: RE | Admit: 2022-03-15 | Discharge: 2022-03-15 | Disposition: A | Discharge status: Home / Self Care | Payer: 59 | Source: Ambulatory Visit | Attending: Adult Health | Admitting: Adult Health

## 2022-03-15 DIAGNOSIS — Z1231 Encounter for screening mammogram for malignant neoplasm of breast: Secondary | ICD-10-CM

## 2022-03-18 ENCOUNTER — Telehealth: Payer: Self-pay

## 2022-03-18 ENCOUNTER — Other Ambulatory Visit: Payer: Self-pay

## 2022-03-18 DIAGNOSIS — Z170 Estrogen receptor positive status [ER+]: Secondary | ICD-10-CM

## 2022-03-18 NOTE — Progress Notes (Signed)
Received order requisition from [facility] to obtain diag MM bilat for pt. Orders entered per MD. Attempted to call pt to find out if she has been in touch with the office to schedule MM. LVM for call back.

## 2022-03-18 NOTE — Telephone Encounter (Signed)
Pt returned call to [facility] and states she will call to have MM scheduled. Order is in.

## 2022-03-28 ENCOUNTER — Other Ambulatory Visit (HOSPITAL_BASED_OUTPATIENT_CLINIC_OR_DEPARTMENT_OTHER): Payer: Self-pay

## 2022-03-28 MED ORDER — COMIRNATY 30 MCG/0.3ML IM SUSY
PREFILLED_SYRINGE | INTRAMUSCULAR | 0 refills | Status: DC
  Filled 2022-03-28: qty 0.3, 1d supply, fill #0

## 2022-03-28 MED ORDER — INFLUENZA VAC SPLIT QUAD 0.5 ML IM SUSY
PREFILLED_SYRINGE | INTRAMUSCULAR | 0 refills | Status: DC
  Filled 2022-03-28: qty 0.5, 1d supply, fill #0

## 2022-04-11 ENCOUNTER — Other Ambulatory Visit (HOSPITAL_COMMUNITY): Payer: Self-pay

## 2022-04-18 ENCOUNTER — Other Ambulatory Visit (HOSPITAL_BASED_OUTPATIENT_CLINIC_OR_DEPARTMENT_OTHER): Payer: Self-pay

## 2022-05-01 ENCOUNTER — Other Ambulatory Visit (HOSPITAL_COMMUNITY): Payer: Self-pay

## 2022-05-01 MED ORDER — CITALOPRAM HYDROBROMIDE 20 MG PO TABS
20.0000 mg | ORAL_TABLET | Freq: Every day | ORAL | 0 refills | Status: DC
  Filled 2022-05-01: qty 90, 90d supply, fill #0

## 2022-05-02 IMAGING — US US BREAST*L* LIMITED INC AXILLA
1 series · 5 of 5 positions shown · non-contrast
Comparison: Previous exam(s).

CLINICAL DATA: 54-year-old female presenting for routine annual
surveillance status post right breast lumpectomy in Wednesday April, 2020
for invasive ductal carcinoma and DCIS in the right breast. She has
history of an excisional biopsy from the left breast in 0436 with
pathology results indicating atypical ductal hyperplasia and flat
epithelial atypia. The patient has family history of breast cancer
in her older sister who was diagnosed a year ago.

EXAM:
DIGITAL DIAGNOSTIC BILATERAL MAMMOGRAM WITH TOMOSYNTHESIS AND CAD;
ULTRASOUND LEFT BREAST LIMITED
TECHNIQUE: Bilateral digital diagnostic mammography and breast tomosynthesis
was performed. The images were evaluated with computer-aided
detection.; Targeted ultrasound examination of the left breast was
performed.

[Series 1: us breast*left* limited inc axilla · 0.06mm/px · 5 of 5 slices shown]
[im 1/5]
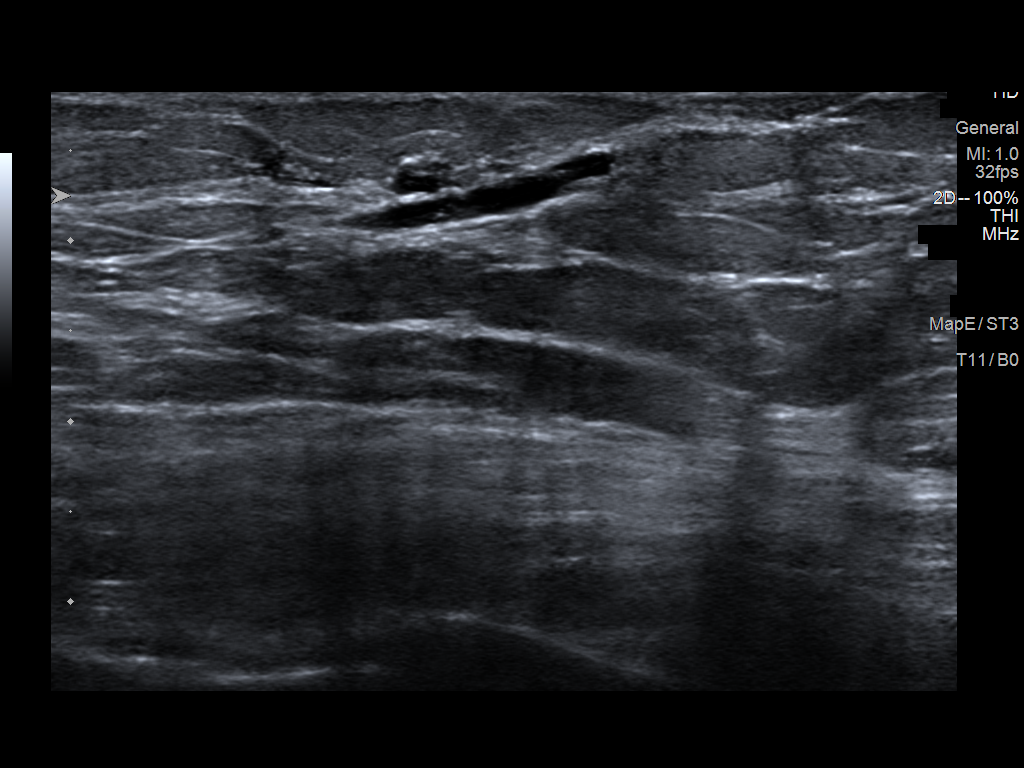
[im 2/5]
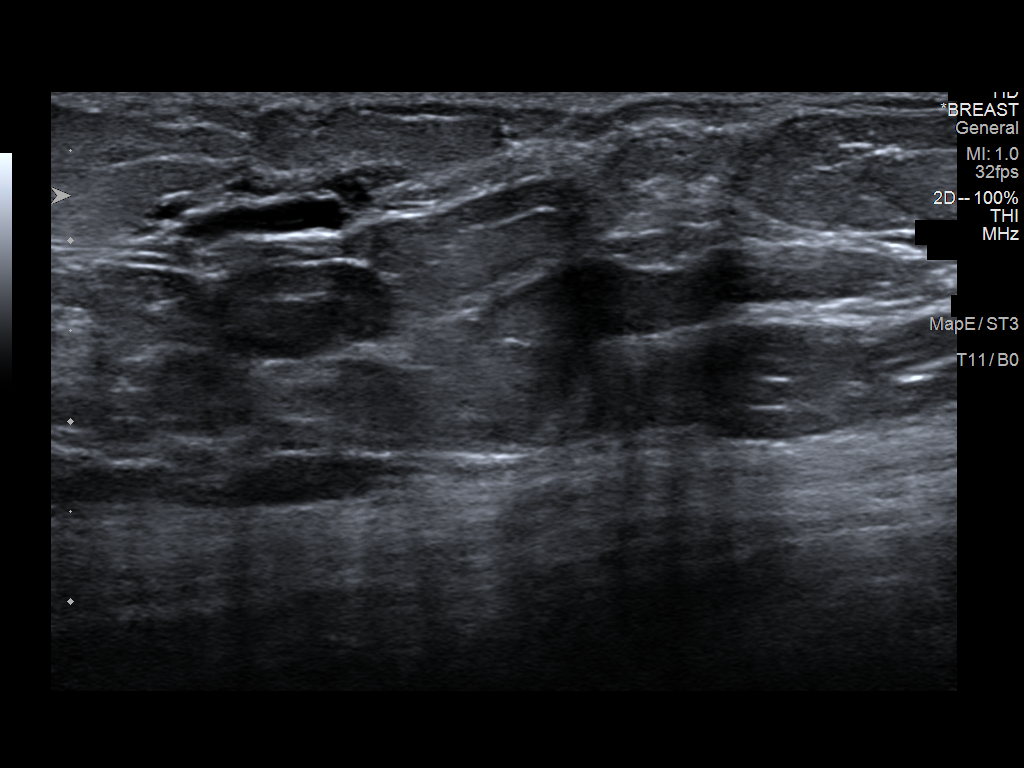
[im 3/5]
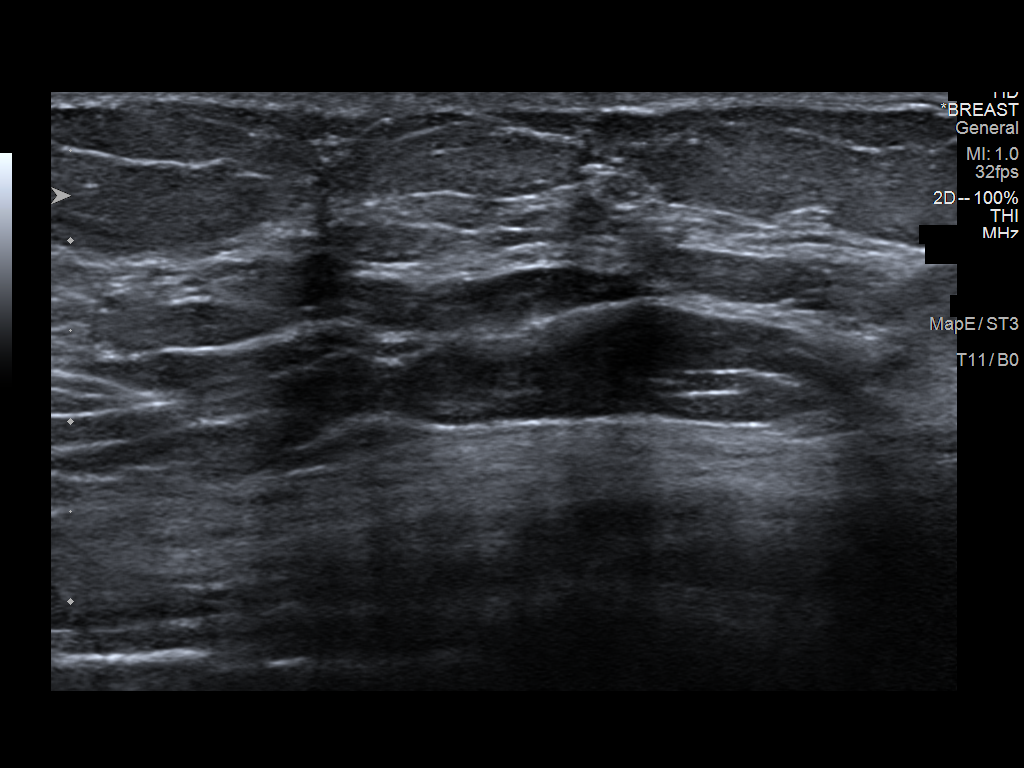
[im 4/5]
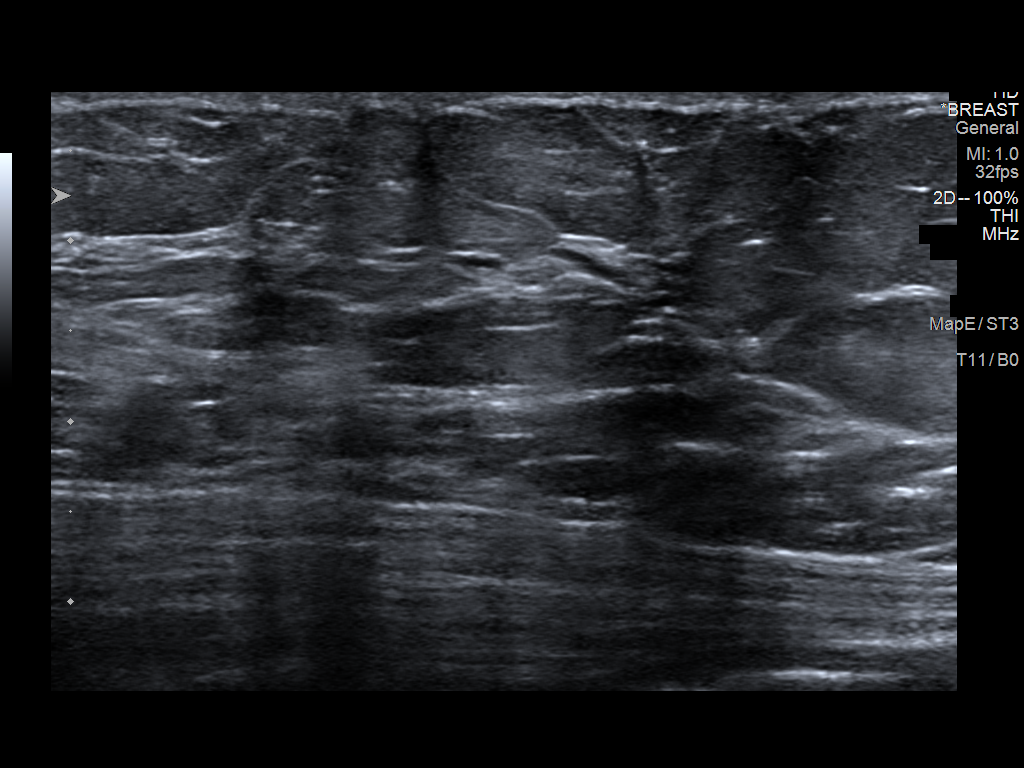
[im 5/5]
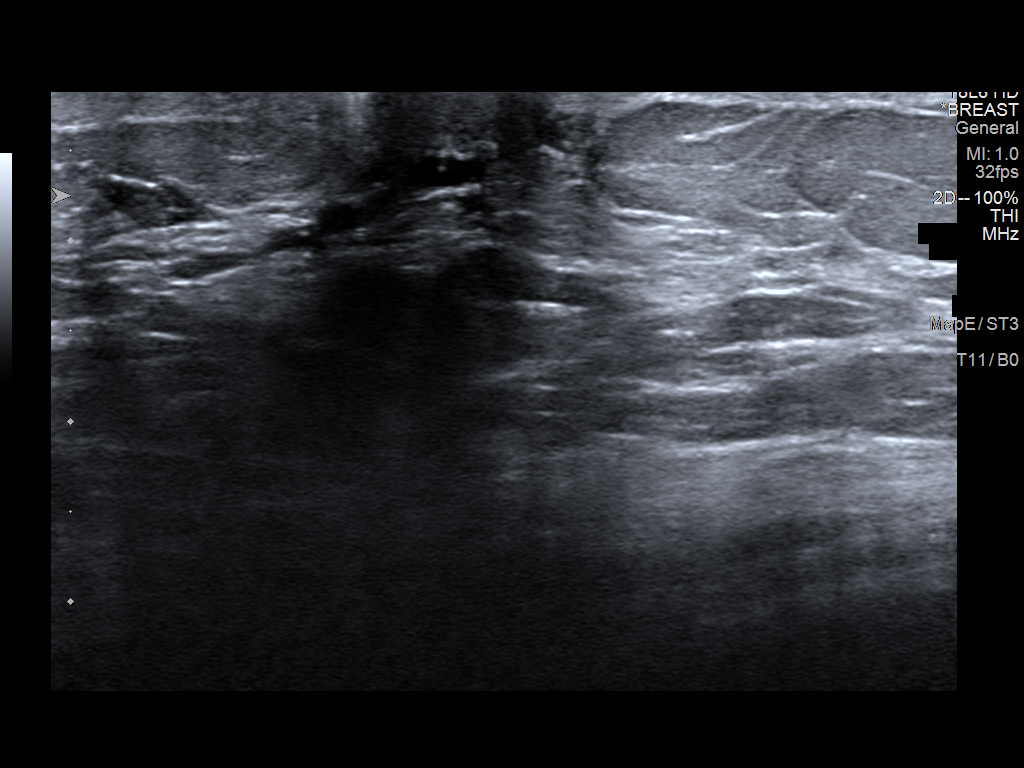

[5 of 5 positions shown; findings below may reference images not displayed]

ACR Breast Density Category c: The breast tissue is heterogeneously
dense, which may obscure small masses.
FINDINGS: Expected surgical changes are noted in the upper-outer quadrant of
the right breast consistent with history of lumpectomy. In the
superior central right breast, there is a 7 mm nodular asymmetry
with a few associated calcifications spanning 5 mm. The asymmetry
appears overall stable, however the calcifications are developing.

There is a focal asymmetry also in the retroareolar slightly lateral
left breast also measuring approximately 6-7 mm. This appears to
mostly resolve on the spot compression tomosynthesis images, however
ultrasound will be performed for further confirmation.

No other suspicious calcifications, masses or areas of distortion
are seen in the bilateral breasts.

Ultrasound targeted to the retroareolar left breast demonstrates
normal fibroglandular tissue in the retroareolar left breast. Few
dilated ducts are identified, without abnormal intraductal masses.
IMPRESSION: 1. There is a 5 mm group of indeterminate calcifications within an
asymmetry in the superior right breast.

2. Expected surgical changes in the upper-outer right breast
consistent with history of lumpectomy.

3.  No evidence of left breast malignancy.

RECOMMENDATION:
1. Stereotactic biopsy is recommended for the right breast
calcifications. The procedure has been scheduled for 02/07/2021 at

2. Given the patient's family and personal history of breast cancer
and complexity of her breast tissue, consider additional layer of
annual surveillance with bilateral breast MRI.

I have discussed the findings and recommendations with the patient.
If applicable, a reminder letter will be sent to the patient
regarding the next appointment.

BI-RADS CATEGORY  4: Suspicious.

## 2022-05-11 IMAGING — MG MM BREAST LOCALIZATION CLIP
4 series · 4 of 12 positions shown · non-contrast
Comparison: Previous exam(s).

CLINICAL DATA: Evaluate COIL biopsy clip following 3D/stereotactic
guided RIGHT breast biopsy.

EXAM:
3D DIAGNOSTIC RIGHT MAMMOGRAM POST STEREOTACTIC BIOPSY

[R CC synth-2D]
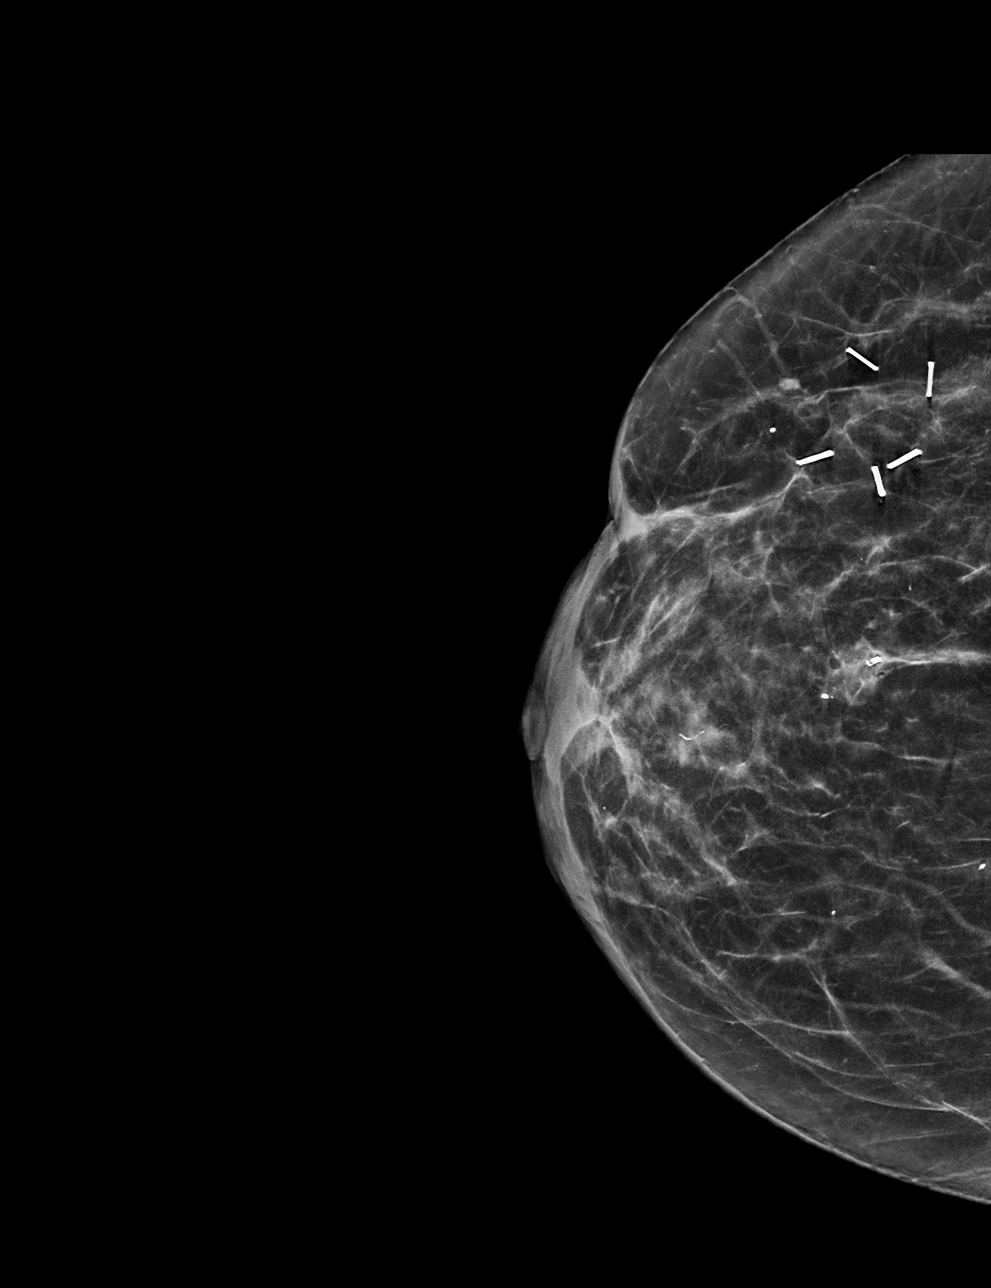

[R ML synth-2D]
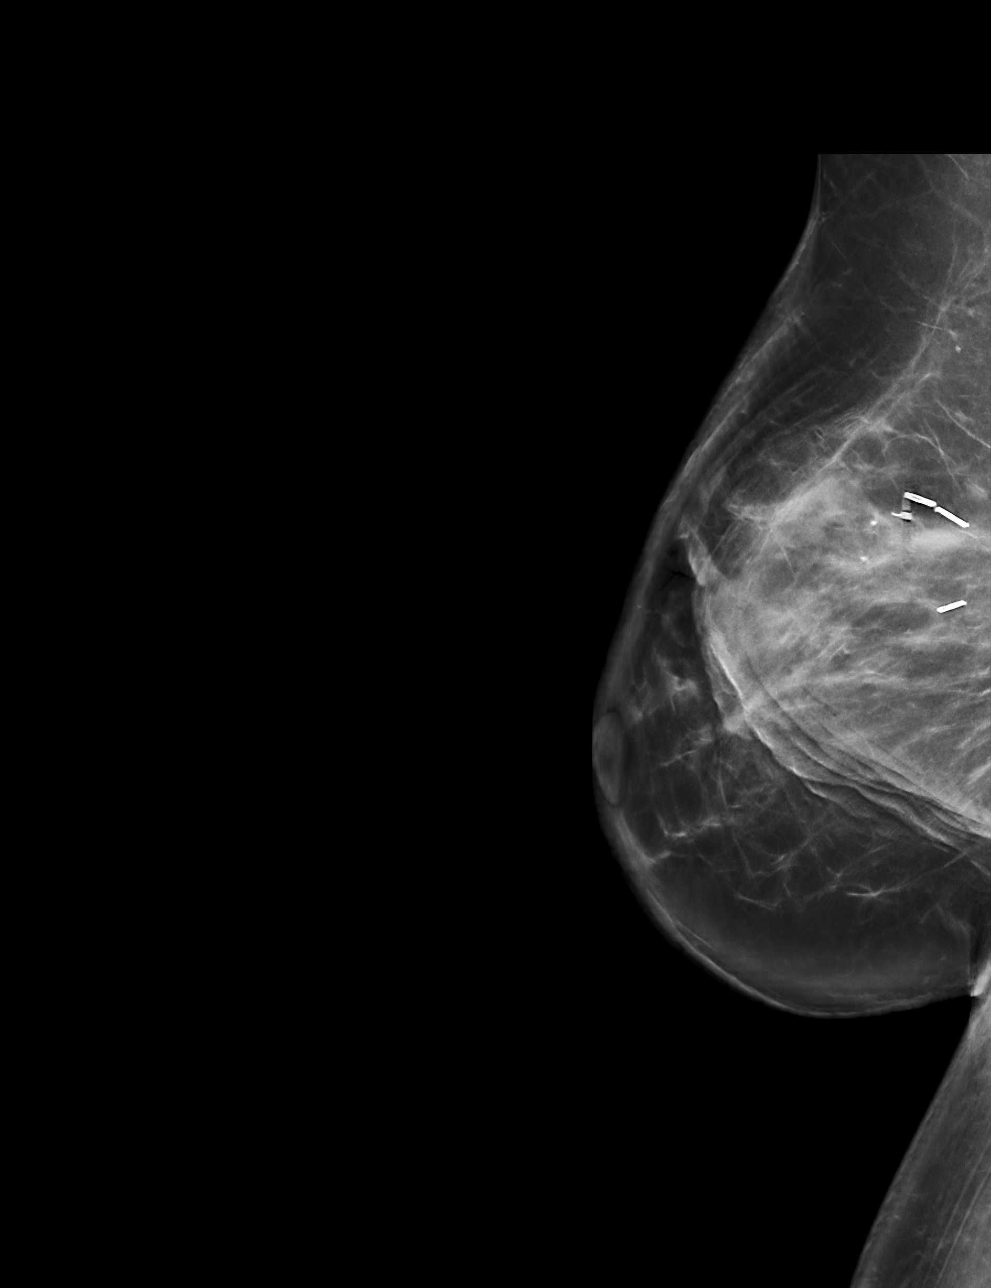

[R ML tomo · tomo slice 43/85.0]
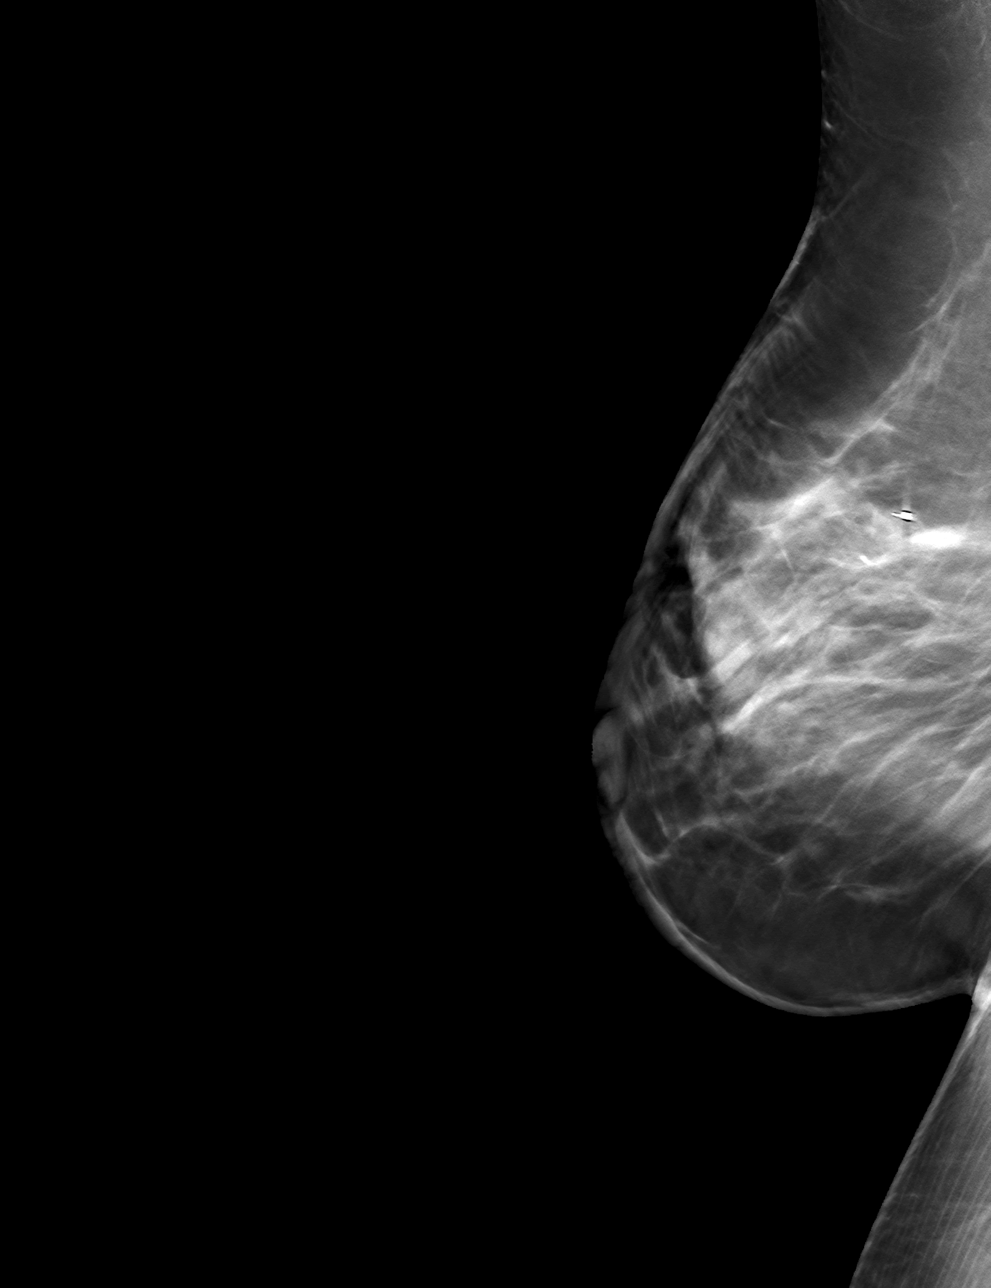

[R CC tomo · tomo slice 25/50.0]
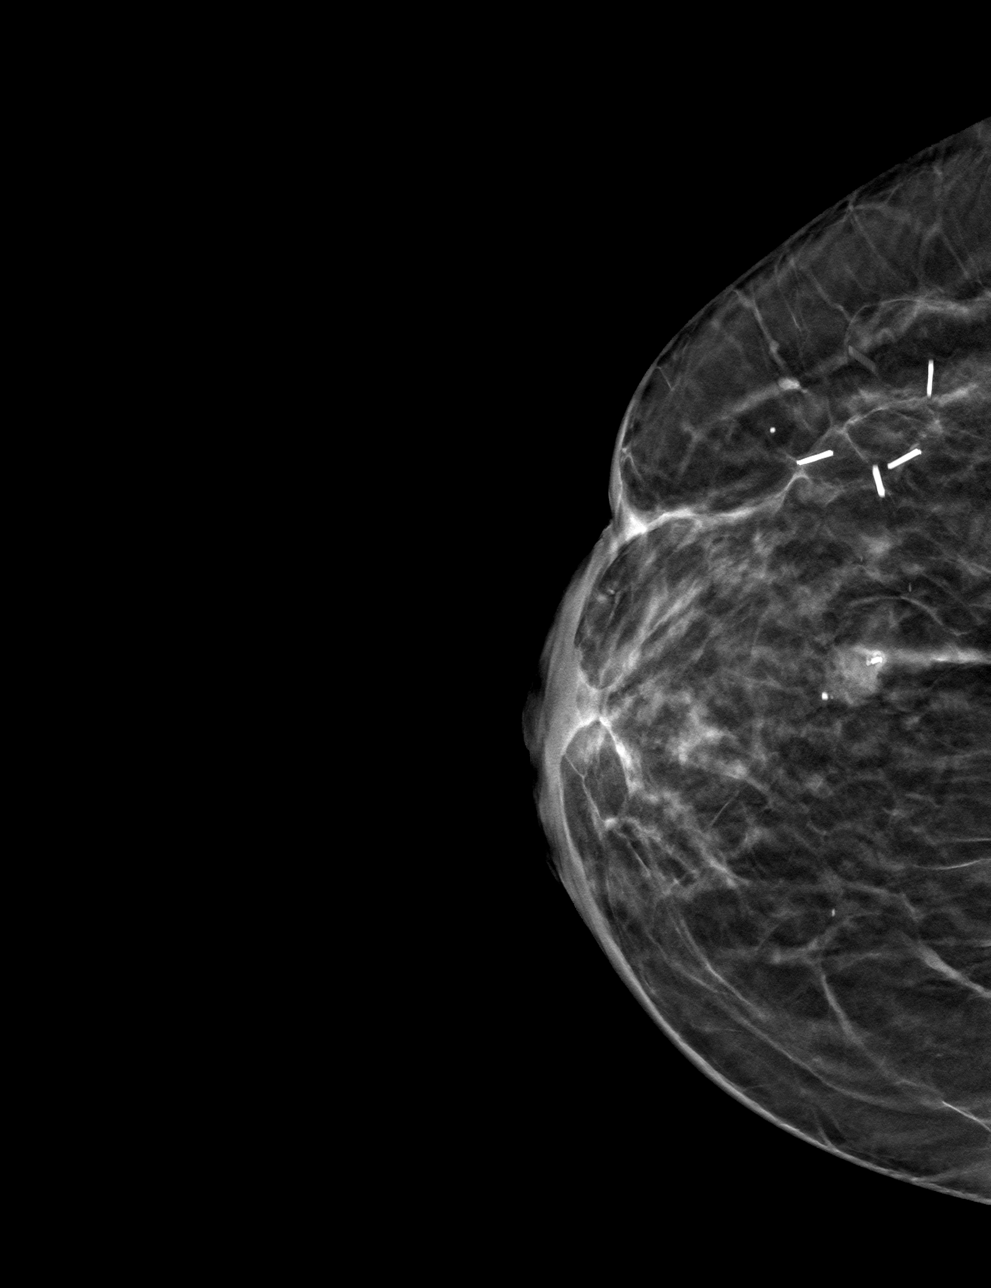

[4 of 12 positions shown; findings below may reference images not displayed]

FINDINGS: 3D Mammographic images were obtained following 3D/stereotactic
guided guided biopsy of 0.5 cm group of UPPER RIGHT breast
calcifications/asymmetry. The COIL biopsy marking clip is in
expected position at the site of biopsy.
IMPRESSION: Appropriate positioning of the COIL shaped biopsy marking clip at
the site of biopsy in the UPPER RIGHT breast.

Final Assessment: Post Procedure Mammograms for Marker Placement

## 2022-05-14 ENCOUNTER — Ambulatory Visit
Admission: RE | Admit: 2022-05-14 | Discharge: 2022-05-14 | Disposition: A | Discharge status: Home / Self Care | Payer: Commercial Managed Care - PPO | Source: Ambulatory Visit | Attending: Hematology and Oncology | Admitting: Hematology and Oncology

## 2022-05-14 DIAGNOSIS — R928 Other abnormal and inconclusive findings on diagnostic imaging of breast: Secondary | ICD-10-CM | POA: Diagnosis not present

## 2022-05-14 DIAGNOSIS — Z853 Personal history of malignant neoplasm of breast: Secondary | ICD-10-CM | POA: Diagnosis not present

## 2022-05-14 DIAGNOSIS — Z170 Estrogen receptor positive status [ER+]: Secondary | ICD-10-CM

## 2022-05-14 NOTE — Assessment & Plan Note (Signed)
02/29/2020: Screening detected right breast cancer 0.6 cm at 10 o'clock position with multiple benign cysts.  Biopsy 02/29/2020: Grade 1 IDC with DCIS ER/PR greater than 95%, Ki-67: 5%, HER-2 negative T1BN0 stage Ia 04/20/2020: Right lumpectomy: Grade 1 IDC 0.9 cm, intermediate grade DCIS, margins negative, DCIS focally positive posterior margin and less than 1 mm from anterior margin.  0/2 lymph nodes negative, 06/01/2020: Margin excision: Benign   Treatment plan: 1.  Adjuvant radiation therapy 07/18/2020-08/08/2020 2. followed by adjuvant antiestrogen therapy with anastrozole 1 mg daily x5 years started May 2022    Anastrozole toxicities: Hot flashes: They can be quite severe for her.   Breast cancer surveillance: 1.  Breast exam 05/15/2022: Benign 2. mammogram 05/14/2022:   Right lumpectomy 03/29/2021: Residual CSL with calcifications Since there is no evidence of DCIS or invasive cancer, we can see her once a year.  Bone density 10/26/2021: T-score -1.7: Osteopenia   Return to clinic in 1 year for follow-up

## 2022-05-14 NOTE — Progress Notes (Signed)
Patient Care Team: [name], MD as PCP - General (Family Medicine) Nicholas Lose, MD as Consulting Physician (Hematology and Oncology) Kyung Rudd, MD as Consulting Physician (Radiation Oncology) Jovita Kussmaul, MD as Consulting Physician (General Surgery) Delrae Rend, MD as Consulting Physician (Endocrinology) Yisroel Ramming, Everardo All, MD as Consulting Physician (Obstetrics and Gynecology)  DIAGNOSIS: No diagnosis found.  SUMMARY OF ONCOLOGIC HISTORY: Oncology History  Malignant neoplasm of upper-outer quadrant of right breast in female, estrogen receptor positive ([place])  02/29/2020 Initial Diagnosis   Screening mammogram detected right breast mass. 0.6cm mass at the 10 o'clock position with multiple benign cysts. Biopsy on 02/29/20 showed invasive ductal carcinoma with DCIS, grade 1, HER-2 negative (1+), ER/PR >95%, Ki67: 5%.    03/21/2020 Cancer Staging   Staging form: Breast, AJCC 8th Edition - Clinical stage from 03/21/2020: Stage IA (cT1b, cN0, cM0, G1, ER+, PR+, HER2-) - Signed by Nicholas Lose, MD on 03/21/2020   04/16/2020 Genetic Testing   Negative genetic testing: no pathogenic variants detected in [facility] Multi-Cancer Panel.  The report date is April 16, 2020.    The Multi-Cancer Panel offered by [facility] includes sequencing and/or deletion duplication testing of the following 85 genes: AIP, ALK, APC, ATM, AXIN2,BAP1,  BARD1, BLM, BMPR1A, BRCA1, BRCA2, BRIP1, CASR, CDC73, CDH1, CDK4, CDKN1B, CDKN1C, CDKN2A (p14ARF), CDKN2A (p16INK4a), CEBPA, CHEK2, CTNNA1, DICER1, DIS3L2, EGFR (c.2369C>T, p.Thr790Met variant only), EPCAM (Deletion/duplication testing only), FH, FLCN, GATA2, GPC3, GREM1 (Promoter region deletion/duplication testing only), HOXB13 (c.251G>A, p.Gly84Glu), HRAS, KIT, MAX, MEN1, MET, MITF (c.952G>A, p.Glu318Lys variant only), MLH1, MSH2, MSH3, MSH6, MUTYH, NBN, NF1, NF2, NTHL1, PALB2, PDGFRA, PHOX2B, PMS2, POLD1, POLE, POT1, PRKAR1A, PTCH1, PTEN, RAD50, RAD51C,  RAD51D, RB1, RECQL4, RET, RNF43, RUNX1, SDHAF2, SDHA (sequence changes only), SDHB, SDHC, SDHD, SMAD4, SMARCA4, SMARCB1, SMARCE1, STK11, SUFU, TERC, TERT, TMEM127, TP53, TSC1, TSC2, VHL, WRN and WT1.    04/20/2020 Surgery   Right lumpectomy [name]): invasive and in situ carcinoma, grade 1, 0.9cm, DCIS focally present at margin, 2 right axillary lymph nodes negative for carcinoma.   04/20/2020 Cancer Staging   Staging form: Breast, AJCC 8th Edition - Pathologic stage from 04/20/2020: Stage IA (pT1b, pN0, cM0, G1, ER+, PR+, HER2-) - Signed by Gardenia Phlegm, NP on 11/06/2020 Stage prefix: Initial diagnosis Histologic grading system: 3 grade system   06/01/2020 Surgery   Re-excision of positive margin [name]): no malignancy identified    07/18/2020 -  Radiation Therapy   Adjuvant radiation   07/2020 -  Anti-estrogen oral therapy   Anastrozole daily     CHIEF COMPLIANT: Follow-up anastrozole  INTERVAL HISTORY: Nicole Khan is a 57 y.o. with above-mentioned history of right breast cancer who underwent a right lumpectomy followed by re-excision and is currently on radiation treatment. She presents to the clinic today for follow-up.      ALLERGIES:  is allergic to other, soy allergy, celery oil, corylus, eggs or egg-derived products, and tape.  MEDICATIONS:  Current Outpatient Medications  Medication Sig Dispense Refill   acetaminophen (TYLENOL) 500 MG tablet Take 1,000 mg by mouth every 6 (six) hours as needed for headache.     anastrozole (ARIMIDEX) 1 MG tablet Take 1 tablet (1 mg total) by mouth daily. 90 tablet 3   atorvastatin (LIPITOR) 40 MG tablet Take 1 tablet (40 mg total) by mouth at bedtime. 90 tablet 2   buPROPion (WELLBUTRIN XL) 300 MG 24 hr tablet Take 1 tablet (300 mg total) by mouth in the morning. 90 tablet 3  citalopram (CELEXA) 20 MG tablet Take 1 tablet (20 mg total) by mouth daily. 90 tablet 0   COVID-19 mRNA vaccine 2023-2024 (COMIRNATY) syringe Inject  into the muscle. 0.3 mL 0   Dulaglutide (TRULICITY) 3 0000000 SOPN Inject 3 mg into the skin once a week. 6 mL 4   empagliflozin (JARDIANCE) 10 MG TABS tablet Take 1 tablet (10 mg total) by mouth daily. 90 tablet 3   influenza vac split quadrivalent PF (FLUARIX) 0.5 ML injection Inject into the muscle. 0.5 mL 0   levocetirizine (XYZAL) 5 MG tablet Take 5 mg by mouth every evening.     metFORMIN (GLUCOPHAGE-XR) 500 MG 24 hr tablet 4 tablets with a meal     metFORMIN (GLUCOPHAGE-XR) 500 MG 24 hr tablet Take 4 tablets (2,000 mg total) by mouth daily with a meal 360 tablet 3   metoprolol tartrate (LOPRESSOR) 100 MG tablet Take 1 tablet by mouth 2 (two) times daily.     metoprolol tartrate (LOPRESSOR) 100 MG tablet Take 1 tablet (100 mg total) by mouth 2 (two) times daily. 180 tablet 3   phenylephrine (SUDAFED PE) 10 MG TABS tablet Take 10 mg by mouth every 6 (six) hours as needed (for congestion).     spironolactone (ALDACTONE) 100 MG tablet Take 1 tablet (100 mg total) by mouth 2 (two) times daily. 180 tablet 3   No current facility-administered medications for this visit.    PHYSICAL EXAMINATION: ECOG PERFORMANCE STATUS: {CHL ONC ECOG [phone]}  There were no vitals filed for this visit. There were no vitals filed for this visit.  BREAST:*** No palpable masses or nodules in either right or left breasts. No palpable axillary supraclavicular or infraclavicular adenopathy no breast tenderness or nipple discharge. (exam performed in the presence of a chaperone)  LABORATORY DATA:  I have reviewed the data as listed    Latest Ref Rng & Units 03/22/2021    3:16 PM 06/01/2020    7:53 AM 04/17/2020    1:34 PM  CMP  Glucose 70 - 99 mg/dL 118  104  140   BUN 6 - 20 mg/dL 14  15  14   $ Creatinine 0.44 - 1.00 mg/dL 1.09  1.31  1.00   Sodium 135 - 145 mmol/L 141  136  138   Potassium 3.5 - 5.1 mmol/L 4.3  3.7  4.4   Chloride 98 - 111 mmol/L 106  103  104   CO2 22 - 32 mmol/L 24  20  24    $ Calcium 8.9 - 10.3 mg/dL 9.8  10.0  9.4     Lab Results  Component Value Date   WBC 6.2 03/22/2021   HGB 16.7 (H) 03/22/2021   HCT 50.9 (H) 03/22/2021   MCV 96.8 03/22/2021   PLT 307 03/22/2021   NEUTROABS 5.4 09/24/2017    ASSESSMENT & PLAN:  No problem-specific Assessment & Plan notes found for this encounter.    No orders of the defined types were placed in this encounter.  The patient has a good understanding of the overall plan. she agrees with it. she will call with any problems that may develop before the next visit here. Total time spent: 30 mins including face to face time and time spent for planning, charting and co-ordination of care   Suzzette Righter, [place] 05/14/22    I Gardiner Coins am acting as a Education administrator for [employer]  ***

## 2022-05-15 ENCOUNTER — Other Ambulatory Visit (HOSPITAL_COMMUNITY): Payer: Self-pay

## 2022-05-15 ENCOUNTER — Other Ambulatory Visit: Payer: Self-pay

## 2022-05-15 ENCOUNTER — Inpatient Hospital Stay: Payer: Commercial Managed Care - PPO | Attending: Hematology and Oncology | Admitting: Hematology and Oncology

## 2022-05-15 VITALS — BP 116/66 | HR 78 | Temp 97.7°F | Resp 14 | Ht 61.5 in | Wt 175.5 lb

## 2022-05-15 DIAGNOSIS — M8580 Other specified disorders of bone density and structure, unspecified site: Secondary | ICD-10-CM | POA: Insufficient documentation

## 2022-05-15 DIAGNOSIS — C50411 Malignant neoplasm of upper-outer quadrant of right female breast: Secondary | ICD-10-CM | POA: Diagnosis not present

## 2022-05-15 DIAGNOSIS — Z170 Estrogen receptor positive status [ER+]: Secondary | ICD-10-CM | POA: Insufficient documentation

## 2022-05-15 DIAGNOSIS — Z79811 Long term (current) use of aromatase inhibitors: Secondary | ICD-10-CM | POA: Diagnosis not present

## 2022-05-15 MED ORDER — ANASTROZOLE 1 MG PO TABS
1.0000 mg | ORAL_TABLET | Freq: Every day | ORAL | 3 refills | Status: DC
  Filled 2022-05-15 – 2022-08-02 (×2): qty 90, 90d supply, fill #0
  Filled 2022-10-30: qty 90, 90d supply, fill #1
  Filled 2023-02-07: qty 90, 90d supply, fill #2
  Filled 2023-05-06: qty 90, 90d supply, fill #3

## 2022-05-29 ENCOUNTER — Other Ambulatory Visit: Payer: Self-pay

## 2022-05-29 ENCOUNTER — Other Ambulatory Visit (HOSPITAL_COMMUNITY): Payer: Self-pay

## 2022-05-29 MED ORDER — JARDIANCE 10 MG PO TABS
10.0000 mg | ORAL_TABLET | Freq: Every day | ORAL | 3 refills | Status: DC
  Filled 2022-05-29: qty 90, 90d supply, fill #0

## 2022-06-24 DIAGNOSIS — M65342 Trigger finger, left ring finger: Secondary | ICD-10-CM | POA: Diagnosis not present

## 2022-06-25 ENCOUNTER — Other Ambulatory Visit (HOSPITAL_COMMUNITY): Payer: Self-pay

## 2022-06-25 MED ORDER — CITALOPRAM HYDROBROMIDE 20 MG PO TABS
20.0000 mg | ORAL_TABLET | Freq: Every day | ORAL | 0 refills | Status: DC
  Filled 2022-06-25 – 2022-08-02 (×2): qty 90, 90d supply, fill #0

## 2022-06-26 ENCOUNTER — Encounter (HOSPITAL_COMMUNITY): Payer: Self-pay

## 2022-06-26 ENCOUNTER — Other Ambulatory Visit (HOSPITAL_COMMUNITY): Payer: Self-pay

## 2022-06-26 ENCOUNTER — Other Ambulatory Visit: Payer: Self-pay

## 2022-06-27 ENCOUNTER — Other Ambulatory Visit (HOSPITAL_COMMUNITY): Payer: Self-pay

## 2022-07-01 ENCOUNTER — Other Ambulatory Visit: Payer: Self-pay

## 2022-07-04 DIAGNOSIS — G4733 Obstructive sleep apnea (adult) (pediatric): Secondary | ICD-10-CM | POA: Diagnosis not present

## 2022-07-04 DIAGNOSIS — I10 Essential (primary) hypertension: Secondary | ICD-10-CM | POA: Diagnosis not present

## 2022-07-04 DIAGNOSIS — J45998 Other asthma: Secondary | ICD-10-CM | POA: Diagnosis not present

## 2022-07-04 DIAGNOSIS — Z1211 Encounter for screening for malignant neoplasm of colon: Secondary | ICD-10-CM | POA: Diagnosis not present

## 2022-07-04 DIAGNOSIS — F32A Depression, unspecified: Secondary | ICD-10-CM | POA: Diagnosis not present

## 2022-07-04 DIAGNOSIS — R194 Change in bowel habit: Secondary | ICD-10-CM | POA: Diagnosis not present

## 2022-07-04 DIAGNOSIS — Z86010 Personal history of colonic polyps: Secondary | ICD-10-CM | POA: Diagnosis not present

## 2022-07-04 DIAGNOSIS — K5730 Diverticulosis of large intestine without perforation or abscess without bleeding: Secondary | ICD-10-CM | POA: Diagnosis not present

## 2022-07-04 DIAGNOSIS — K9041 Non-celiac gluten sensitivity: Secondary | ICD-10-CM | POA: Diagnosis not present

## 2022-07-04 DIAGNOSIS — E669 Obesity, unspecified: Secondary | ICD-10-CM | POA: Diagnosis not present

## 2022-07-04 DIAGNOSIS — F419 Anxiety disorder, unspecified: Secondary | ICD-10-CM | POA: Diagnosis not present

## 2022-07-08 ENCOUNTER — Other Ambulatory Visit: Payer: Self-pay

## 2022-07-08 ENCOUNTER — Encounter: Payer: Self-pay | Admitting: Neurology

## 2022-07-08 DIAGNOSIS — R202 Paresthesia of skin: Secondary | ICD-10-CM

## 2022-08-02 ENCOUNTER — Other Ambulatory Visit (HOSPITAL_COMMUNITY): Payer: Self-pay

## 2022-08-03 ENCOUNTER — Other Ambulatory Visit (HOSPITAL_COMMUNITY): Payer: Self-pay

## 2022-08-05 ENCOUNTER — Other Ambulatory Visit: Payer: Self-pay

## 2022-08-05 ENCOUNTER — Other Ambulatory Visit (HOSPITAL_COMMUNITY): Payer: Self-pay

## 2022-08-05 MED ORDER — BUPROPION HCL ER (XL) 300 MG PO TB24
300.0000 mg | ORAL_TABLET | Freq: Every morning | ORAL | 0 refills | Status: DC
  Filled 2022-08-05: qty 30, 30d supply, fill #0

## 2022-08-06 ENCOUNTER — Other Ambulatory Visit (HOSPITAL_COMMUNITY): Payer: Self-pay

## 2022-08-08 ENCOUNTER — Ambulatory Visit: Payer: Commercial Managed Care - PPO | Admitting: Neurology

## 2022-08-08 DIAGNOSIS — R202 Paresthesia of skin: Secondary | ICD-10-CM | POA: Diagnosis not present

## 2022-08-08 NOTE — Procedures (Signed)
  [facility]  [address] [place], [address]  [place], [place] [address] Tel: [phone] Fax: [phone] Test Date:  08/08/2022  Patient: Nicole Khan DOB: 23-Aug-1965 Physician: Nita Sickle, DO  Sex: Female Height: 5\' 1"  Ref Phys: Margarita Rana, MD  ID#: [id number]   Technician:    History: This is a 57 year old female referred for evaluation of right fifth digit numbness and tingling.  NCV & EMG Findings: Extensive electrodiagnostic testing of the right upper extremity shows:  Right ulnar and dorsal ulnar cutaneous sensory responses show prolonged latency (R3.8, R3.5 ms) and reduced amplitude (R5.7, R7.0 V).  Right median sensory responses within normal limits. Right ulnar motor response shows conduction block across and markedly slowed conduction velocity across the elbow (A Elbow-B Elbow, 21 m/s).  Right median motor response is within normal limits; of note, there is evidence of a right Martin-Gruber anastomosis, a normal anatomic variant. Chronic motor axon loss changes are seen in the right first dorsal interosseous and abductor digiti minimi muscles.  There is no evidence of accompanying active denervation.  Impression: Right ulnar neuropathy with conduction block and slowing across the elbow, moderate-to-severe in degree electrically.    ___________________________ Nita Sickle, DO    Nerve Conduction Studies   Stim Site NR Peak (ms) Norm Peak (ms) O-P Amp (V) Norm O-P Amp  Right DorsCutan Anti Sensory (Dorsum 5th MC)  32 C  Wrist    *3.5 <3.1 *7.0 >10  Right Median Anti Sensory (2nd Digit)  32 C  Wrist    3.2 <3.6 36.6 >15  Right Ulnar Anti Sensory (5th Digit)  32 C  Wrist    *3.8 <3.1 *5.7 >10     Stim Site NR Onset (ms) Norm Onset (ms) O-P Amp (mV) Norm O-P Amp Site1 Site2 Delta-0 (ms) Dist (cm) Vel (m/s) Norm Vel (m/s)  Right Median Motor (Abd Poll Brev)  32 C  Wrist    3.6 <4.0 6.4 >6 Elbow Wrist 4.8 25.0 52 >50  Elbow    8.4  7.6   Ulnar-wrist crossover Elbow 4.2 0.0    Ulnar-wrist crossover    4.2  3.2         Right Ulnar Motor (Abd Dig Minimi)  32 C  Wrist    2.0 <3.1 10.1 >7 B Elbow Wrist 2.8 19.0 68 >50  B Elbow    4.8  9.2  A Elbow B Elbow 4.7 10.0 *21 >50  A Elbow    9.5  5.4          Electromyography   Side Muscle Ins.Act Fibs Fasc Recrt Amp Dur Poly Activation Comment  Right 1stDorInt Nml Nml Nml *1- *1+ *1+ *1+ Nml N/A  Right Abd Poll Brev Nml Nml Nml Nml Nml Nml Nml Nml N/A  Right PronatorTeres Nml Nml Nml Nml Nml Nml Nml Nml N/A  Right Biceps Nml Nml Nml Nml Nml Nml Nml Nml N/A  Right Triceps Nml Nml Nml Nml Nml Nml Nml Nml N/A  Right Deltoid Nml Nml Nml Nml Nml Nml Nml Nml N/A  Right Abd Dig Min Nml Nml Nml *1- *1+ *1+ *1+ Nml N/A  Right FlexCarpiUln Nml Nml Nml Nml Nml Nml Nml Nml N/A      Waveforms:

## 2022-08-09 ENCOUNTER — Other Ambulatory Visit (HOSPITAL_COMMUNITY): Payer: Self-pay

## 2022-08-09 ENCOUNTER — Encounter: Payer: Self-pay | Admitting: Nurse Practitioner

## 2022-08-09 ENCOUNTER — Ambulatory Visit: Payer: Commercial Managed Care - PPO | Admitting: Nurse Practitioner

## 2022-08-09 VITALS — BP 124/72 | HR 68 | Ht 62.0 in | Wt 172.8 lb

## 2022-08-09 DIAGNOSIS — Z0000 Encounter for general adult medical examination without abnormal findings: Secondary | ICD-10-CM | POA: Diagnosis not present

## 2022-08-09 DIAGNOSIS — E1159 Type 2 diabetes mellitus with other circulatory complications: Secondary | ICD-10-CM

## 2022-08-09 DIAGNOSIS — E1169 Type 2 diabetes mellitus with other specified complication: Secondary | ICD-10-CM

## 2022-08-09 DIAGNOSIS — C50411 Malignant neoplasm of upper-outer quadrant of right female breast: Secondary | ICD-10-CM

## 2022-08-09 DIAGNOSIS — I10 Essential (primary) hypertension: Secondary | ICD-10-CM

## 2022-08-09 DIAGNOSIS — E785 Hyperlipidemia, unspecified: Secondary | ICD-10-CM

## 2022-08-09 DIAGNOSIS — E1165 Type 2 diabetes mellitus with hyperglycemia: Secondary | ICD-10-CM | POA: Insufficient documentation

## 2022-08-09 DIAGNOSIS — E559 Vitamin D deficiency, unspecified: Secondary | ICD-10-CM | POA: Diagnosis not present

## 2022-08-09 DIAGNOSIS — Z79899 Other long term (current) drug therapy: Secondary | ICD-10-CM | POA: Diagnosis not present

## 2022-08-09 DIAGNOSIS — I152 Hypertension secondary to endocrine disorders: Secondary | ICD-10-CM

## 2022-08-09 DIAGNOSIS — E282 Polycystic ovarian syndrome: Secondary | ICD-10-CM

## 2022-08-09 DIAGNOSIS — Z170 Estrogen receptor positive status [ER+]: Secondary | ICD-10-CM

## 2022-08-09 MED ORDER — CITALOPRAM HYDROBROMIDE 20 MG PO TABS
20.0000 mg | ORAL_TABLET | Freq: Every day | ORAL | 3 refills | Status: DC
Start: 2022-08-09 — End: 2023-03-20
  Filled 2022-08-09 – 2022-10-30 (×2): qty 90, 90d supply, fill #0
  Filled 2023-02-07: qty 90, 90d supply, fill #1

## 2022-08-09 MED ORDER — METOPROLOL TARTRATE 100 MG PO TABS
100.0000 mg | ORAL_TABLET | Freq: Two times a day (BID) | ORAL | 3 refills | Status: DC
Start: 2022-08-09 — End: 2023-03-20
  Filled 2022-08-09 – 2022-10-30 (×2): qty 180, 90d supply, fill #0
  Filled 2023-02-07: qty 180, 90d supply, fill #1

## 2022-08-09 MED ORDER — OZEMPIC (0.25 OR 0.5 MG/DOSE) 2 MG/3ML ~~LOC~~ SOPN
PEN_INJECTOR | SUBCUTANEOUS | 0 refills | Status: DC
Start: 2022-08-09 — End: 2023-02-18
  Filled 2022-08-09: qty 3, 28d supply, fill #0

## 2022-08-09 MED ORDER — ATORVASTATIN CALCIUM 40 MG PO TABS
40.0000 mg | ORAL_TABLET | Freq: Every evening | ORAL | 3 refills | Status: DC
Start: 2022-08-09 — End: 2023-03-20
  Filled 2022-08-09: qty 90, 90d supply, fill #0
  Filled 2022-11-30: qty 90, 90d supply, fill #1
  Filled 2023-03-03: qty 90, 90d supply, fill #2

## 2022-08-09 MED ORDER — BUPROPION HCL ER (XL) 450 MG PO TB24
450.0000 mg | ORAL_TABLET | Freq: Every morning | ORAL | 3 refills | Status: DC
Start: 2022-08-09 — End: 2022-08-15
  Filled 2022-08-09: qty 90, 90d supply, fill #0

## 2022-08-09 MED ORDER — EMPAGLIFLOZIN 10 MG PO TABS
10.0000 mg | ORAL_TABLET | Freq: Every day | ORAL | 3 refills | Status: DC
Start: 2022-08-09 — End: 2023-03-20
  Filled 2022-08-09: qty 90, 90d supply, fill #0
  Filled 2022-08-12: qty 30, 30d supply, fill #0
  Filled 2022-10-30 – 2022-11-26 (×3): qty 30, 30d supply, fill #1
  Filled 2023-01-14: qty 90, 90d supply, fill #2

## 2022-08-09 MED ORDER — LEVOCETIRIZINE DIHYDROCHLORIDE 5 MG PO TABS
5.0000 mg | ORAL_TABLET | Freq: Every evening | ORAL | 3 refills | Status: DC
Start: 2022-08-09 — End: 2023-03-20
  Filled 2022-08-09: qty 90, 90d supply, fill #0
  Filled 2022-11-26 – 2022-11-30 (×4): qty 90, 90d supply, fill #1
  Filled 2023-03-03: qty 90, 90d supply, fill #2

## 2022-08-09 MED ORDER — SPIRONOLACTONE 100 MG PO TABS
100.0000 mg | ORAL_TABLET | Freq: Two times a day (BID) | ORAL | 3 refills | Status: DC
Start: 2022-08-09 — End: 2023-03-20
  Filled 2022-08-09: qty 180, 90d supply, fill #0
  Filled 2022-11-30: qty 180, 90d supply, fill #1
  Filled 2023-03-03: qty 180, 90d supply, fill #2

## 2022-08-09 MED ORDER — OZEMPIC (0.25 OR 0.5 MG/DOSE) 2 MG/3ML ~~LOC~~ SOPN
0.5000 mg | PEN_INJECTOR | SUBCUTANEOUS | 0 refills | Status: DC
Start: 2022-08-09 — End: 2023-02-18
  Filled 2022-08-09 – 2022-11-26 (×3): qty 3, 28d supply, fill #0

## 2022-08-09 MED ORDER — SEMAGLUTIDE (1 MG/DOSE) 4 MG/3ML ~~LOC~~ SOPN
1.0000 mg | PEN_INJECTOR | SUBCUTANEOUS | 0 refills | Status: DC
Start: 2022-08-09 — End: 2023-02-18
  Filled 2022-08-09 – 2023-01-14 (×2): qty 3, 28d supply, fill #0

## 2022-08-09 NOTE — Progress Notes (Signed)
Nicole Clamp, DNP, AGNP-c [facility] [address] [place], [place] [address] Main Office [phone]  BP 124/72   Pulse 68   Ht 5\' 2"  (1.575 m)   Wt 172 lb 12.8 oz (78.4 kg)   LMP 12/31/2015 (Within Weeks)   BMI 31.61 kg/m    Subjective:    Patient ID: Nicole Khan, female    DOB: 1966/03/07, 57 y.o.   MRN: [id number]  HPI: Nicole Khan is a 57 y.o. female presenting on 08/09/2022 for comprehensive medical examination.   Current medical concerns include: Nicole Khan presents with concerns regarding the management of her diabetes and PCOS. She has been under the care of an endocrinologist but expresses concern with the lack of attention to her PCOS. She is considering alternative management plans.  For diabetes, Nicole Khan is currently prescribed Jardiance 10mg  and extended-release Metformin 500mg . She has previously experienced yeast infections with [facility] Ruths and is exploring more cost-effective medication options due to upcoming insurance changes.  Regarding her mental health, Nicole Khan reports depression and lack of motivation. She has been on long-term treatment with Celexa and Wellbutrin (for approximately 20 years) with previous good management. She is considering dosage adjustments to improve her symptoms.  Nicole Khan has a history of sleep apnea, which has led to the cancellation of a scheduled colonoscopy due to the need for a hospital setting. She also reports recent weight loss.  Her surgical history includes carpal tunnel release and trigger finger release.  Nicole Khan has exercise-induced asthma, limiting her physical activity.  She has had a cholecystectomy and experiences dietary sensitivities, particularly to deep-fried foods. For environmental allergies, Nicole Khan takes Xyzal.  She also manages lymphedema related to her history of breast cancer.   Pertinent items are noted in HPI.  IMMUNIZATIONS:   Flu: Flu vaccine postponed until flu season Prevnar 13: Prevnar  13 N/A for this patient Prevnar 20: Prevnar 20 N/A for this patient Pneumovax 23: Pneumovax 23 N/A for this patient Vac Shingrix: Shingrix due- recommend HPV: HPV N/A for this patient Tetanus: Tetanus completed in the last 10 years  HEALTH MAINTENANCE: Pap Smear HM Status: is up to date Mammogram HM Status: is up to date Colon Cancer Screening HM Status: is up to date Bone Density HM Status: is not applicable for this patient STI Testing HM Status: was declined  Lung CT HM Status: is not applicable for this patient  She reports regular vision exams q1-5y: Yes  She reports regular dental exams q 81m:  Yes  The patient eats a regular, healthy diet. She endorses exercise and/or activity of: Sedentary  Most Recent Depression Screen:     08/09/2022   11:27 AM  Depression screen PHQ 2/9  Decreased Interest 1  Down, Depressed, Hopeless 1  PHQ - 2 Score 2  Altered sleeping 1  Tired, decreased energy 1  Change in appetite 1  Feeling bad or failure about yourself  1  Trouble concentrating 1  Moving slowly or fidgety/restless 0  Suicidal thoughts 0  PHQ-9 Score 7  Difficult doing work/chores Not difficult at all   Most Recent Anxiety Screen:      No data to display         Most Recent Fall Screen:    08/09/2022   11:27 AM  Fall Risk   Falls in the past year? 0  Number falls in past yr: 0  Injury with Fall? 0  Risk for fall due to : No Fall Risks  Follow up Falls evaluation completed  Past medical history, surgical history, medications, allergies, family history and social history reviewed with patient today and changes made to appropriate areas of the chart.  Past Medical History:  Past Medical History:  Diagnosis Date   Abnormal mammogram of left breast 09/26/2017   Abnormal Pap smear of cervix    Anxiety    Asthma    exercise induced asthma   Breast cancer (HCC)    Cancer (HCC)    Depression    Diabetes mellitus without complication (HCC)    type 2    Family history of breast cancer 04/11/2020   Family history of breast cancer 04/11/2020   Family history of prostate cancer 04/11/2020   Family history of prostate cancer 04/11/2020   Family history of thyroid cancer 04/11/2020   Family history of thyroid cancer 04/11/2020   Fracture dislocation of right ankle joint 06/04/2013   Fracture of distal fibula 06/04/2013   H/O bilateral salpingectomy 05/2015   History of kidney stones    passed stones, no surgery required   Hypercholesteremia    Hypertension    Personal history of radiation therapy    Polycystic ovary disease    PONV (postoperative nausea and vomiting)    Sleep apnea 05/2017   uses cpap   SVD (spontaneous vaginal delivery)    x 1   Medications:  Current Outpatient Medications on File Prior to Visit  Medication Sig   acetaminophen (TYLENOL) 500 MG tablet Take 1,000 mg by mouth every 6 (six) hours as needed for headache.   anastrozole (ARIMIDEX) 1 MG tablet Take 1 tablet (1 mg total) by mouth daily.   phenylephrine (SUDAFED PE) 10 MG TABS tablet Take 10 mg by mouth every 6 (six) hours as needed (for congestion).   No current facility-administered medications on file prior to visit.   Surgical History:  Past Surgical History:  Procedure Laterality Date   BREAST BIOPSY Right 02/29/2020   BREAST EXCISIONAL BIOPSY Left 2019   Benign   BREAST LUMPECTOMY     BREAST LUMPECTOMY WITH RADIOACTIVE SEED AND SENTINEL LYMPH NODE BIOPSY Right 04/20/2020   Procedure: RIGHT BREAST LUMPECTOMY WITH RADIOACTIVE SEED AND SENTINEL LYMPH NODE BIOPSY;  Surgeon: Griselda Miner, MD;  Location: [facility] OR;  Service: General;  Laterality: Right;   BREAST LUMPECTOMY WITH RADIOACTIVE SEED LOCALIZATION Left 09/26/2017   Procedure: LEFT BREAST LUMPECTOMY WITH RADIOACTIVE SEED LOCALIZATION ERAS PATHWAY;  Surgeon: Claud Kelp, MD;  Location: [facility];  Service: General;  Laterality: Left;   BREAST LUMPECTOMY WITH RADIOACTIVE SEED  LOCALIZATION Right 03/29/2021   Procedure: RIGHT BREAST LUMPECTOMY WITH RADIOACTIVE SEED LOCALIZATION;  Surgeon: Griselda Miner, MD;  Location: [facility] OR;  Service: General;  Laterality: Right;   CARPAL TUNNEL RELEASE     bilateral   CERVICAL CERCLAGE  07/1991; 05/1999   x 2   CESAREAN SECTION     x 1   CHOLECYSTECTOMY, LAPAROSCOPIC  1996   COLONOSCOPY WITH PROPOFOL N/A 06/24/2017   Procedure: COLONOSCOPY WITH PROPOFOL;  Surgeon: Charna Elizabeth, MD;  Location: [facility];  Service: Endoscopy;  Laterality: N/A;   CYSTOSCOPY N/A 06/07/2014   Procedure: CYSTOSCOPY;  Surgeon: Jerene Bears, MD;  Location: [facility];  Service: Gynecology;  Laterality: N/A;   LAPAROSCOPIC BILATERAL SALPINGO OOPHERECTOMY Right 06/07/2014   Procedure: LAPAROSCOPIC RIGHT SALPINGO OOPHORECTOMY/COLLECTION OF PELVIC WASHINGS;  Surgeon: Jerene Bears, MD;  Location: [facility];  Service: Gynecology;  Laterality: Right;   LAPAROSCOPIC UNILATERAL SALPINGECTOMY Left 06/07/2014   Procedure: LAPAROSCOPIC  UNILATERAL SALPINGECTOMY;  Surgeon: Jerene Bears, MD;  Location: [facility];  Service: Gynecology;  Laterality: Left;   ORIF ANKLE FRACTURE Right 06/07/2013   Procedure: OPEN REDUCTION INTERNAL FIXATION (ORIF) ANKLE FRACTURE  FIBULA SYNDESMOSIS;  Surgeon: Loreta Ave, MD;  Location: [facility];  Service: Orthopedics;  Laterality: Right;   RE-EXCISION OF BREAST CANCER,SUPERIOR MARGINS Right 06/01/2020   Procedure: RIGHT BREAST MARGIN REEXCISION;  Surgeon: Griselda Miner, MD;  Location[facility];  Service: General;  Laterality: Right;   TRIGGER FINGER RELEASE Right 01/30/2021   Allergies:  Allergies  Allergen Reactions   Other Anaphylaxis    Other reaction(s): Other (See Comments) Soy milk   Soy Allergy Anaphylaxis and Other (See Comments)    Soy milk Soy milk   Celery Oil Itching and Other (See Comments)    Causes itching in the mouth.   Corylus Swelling and Other (See Comments)    Hazelnuts, makes patients mouth  swell Other reaction(s): Other (See Comments) Hazelnuts, makes patients mouth swell Hazelnuts, makes patients mouth swell   Egg-Derived Products Nausea Only   Tape Hives    Skin loss with the use of steri strips   Family History:  Family History  Problem Relation Age of Onset   Hypertension Mother    Infertility Mother    Deep vein thrombosis Mother    Anxiety disorder Mother    Depression Mother    Hepatitis C Mother        blood transfusion   Aneurysm Mother        Japanese   Cancer Father        Gallbladder; dx 56s   Hypertension Father    Anxiety disorder Father    Depression Father    Multiple births Sister    Thyroid disease Sister    Anxiety disorder Sister    Depression Sister    Breast cancer Sister 5   Thyroid cancer Sister 52       unknown type   Hypertension Brother    Anxiety disorder Brother    Depression Brother    Prostate cancer Paternal Uncle        dx 48s   Lung cancer Half-Sister 53       maternal half sister; no smoking hx       Objective:    BP 124/72   Pulse 68   Ht 5\' 2"  (1.575 m)   Wt 172 lb 12.8 oz (78.4 kg)   LMP 12/31/2015 (Within Weeks)   BMI 31.61 kg/m   Wt Readings from Last 3 Encounters:  08/12/22 175 lb 6 oz (79.5 kg)  08/09/22 172 lb 12.8 oz (78.4 kg)  05/15/22 175 lb 8 oz (79.6 kg)    Physical Exam Vitals and nursing note reviewed.  Constitutional:      General: She is not in acute distress.    Appearance: Normal appearance.  HENT:     Head: Normocephalic and atraumatic.     Right Ear: Hearing, tympanic membrane, ear canal and external ear normal.     Left Ear: Hearing, tympanic membrane, ear canal and external ear normal.     Nose: Nose normal.     Right Sinus: No maxillary sinus tenderness or frontal sinus tenderness.     Left Sinus: No maxillary sinus tenderness or frontal sinus tenderness.     Mouth/Throat:     Lips: Pink.     Mouth: Mucous membranes are moist.     Pharynx: Oropharynx is clear.  Eyes:      General: Lids are normal. Vision grossly intact. No scleral icterus.    Conjunctiva/sclera: Conjunctivae normal.     Pupils: Pupils are equal, round, and reactive to light.     Funduscopic exam:    Right eye: Red reflex present.        Left eye: Red reflex present.    Visual Fields: Right eye visual fields normal and left eye visual fields normal.  Neck:     Thyroid: No thyromegaly.     Vascular: No carotid bruit.  Cardiovascular:     Rate and Rhythm: Normal rate and regular rhythm.     Chest Wall: PMI is not displaced.     Pulses: Normal pulses.          Dorsalis pedis pulses are 2+ on the right side and 2+ on the left side.       Posterior tibial pulses are 2+ on the right side and 2+ on the left side.     Heart sounds: Normal heart sounds. No murmur heard. Pulmonary:     Effort: Pulmonary effort is normal. No respiratory distress.     Breath sounds: Normal breath sounds.  Abdominal:     General: Abdomen is flat. Bowel sounds are normal. There is no distension.     Palpations: Abdomen is soft. There is no hepatomegaly, splenomegaly or mass.     Tenderness: There is no abdominal tenderness. There is no right CVA tenderness, left CVA tenderness, guarding or rebound.  Musculoskeletal:        General: Normal range of motion.     Cervical back: Full passive range of motion without pain, normal range of motion and neck supple. No tenderness.     Right lower leg: No edema.     Left lower leg: No edema.  Feet:     Left foot:     Toenail Condition: Left toenails are normal.  Lymphadenopathy:     Cervical: No cervical adenopathy.     Upper Body:     Right upper body: No supraclavicular adenopathy.     Left upper body: No supraclavicular adenopathy.  Skin:    General: Skin is warm and dry.     Capillary Refill: Capillary refill takes less than 2 seconds.     Nails: There is no clubbing.  Neurological:     General: No focal deficit present.     Mental Status: She is alert and  oriented to person, place, and time.     GCS: GCS eye subscore is 4. GCS verbal subscore is 5. GCS motor subscore is 6.     Sensory: Sensation is intact.     Motor: Motor function is intact.     Coordination: Coordination is intact.     Gait: Gait is intact.     Deep Tendon Reflexes: Reflexes are normal and symmetric.  Psychiatric:        Attention and Perception: Attention normal.        Mood and Affect: Mood normal.        Speech: Speech normal.        Behavior: Behavior normal. Behavior is cooperative.        Cognition and Memory: Cognition and memory normal.     Results for orders placed or performed in visit on 08/09/22  Hemoglobin A1c  Result Value Ref Range   Hgb A1c MFr Bld 6.1 (H) 4.8 - 5.6 %   Est. average glucose Bld gHb Est-mCnc 128 mg/dL  Comprehensive metabolic panel  Result Value Ref Range   Glucose 86 70 - 99 mg/dL   BUN 14 6 - 24 mg/dL   Creatinine, Ser 4.09 (H) 0.57 - 1.00 mg/dL   eGFR 56 (L) >81 XB/JYN/8.29   BUN/Creatinine Ratio 12 9 - 23   Sodium 142 134 - 144 mmol/L   Potassium 4.8 3.5 - 5.2 mmol/L   Chloride 103 96 - 106 mmol/L   CO2 20 20 - 29 mmol/L   Calcium 10.6 (H) 8.7 - 10.2 mg/dL   Total Protein 6.9 6.0 - 8.5 g/dL   Albumin 4.7 3.8 - 4.9 g/dL   Globulin, Total 2.2 1.5 - 4.5 g/dL   Albumin/Globulin Ratio 2.1 1.2 - 2.2   Bilirubin Total 0.9 0.0 - 1.2 mg/dL   Alkaline Phosphatase 109 44 - 121 IU/L   AST 19 0 - 40 IU/L   ALT 23 0 - 32 IU/L  CBC with Differential/Platelet  Result Value Ref Range   WBC 7.0 3.4 - 10.8 x10E3/uL   RBC 5.29 (H) 3.77 - 5.28 x10E6/uL   Hemoglobin 16.7 (H) 11.1 - 15.9 g/dL   Hematocrit 56.2 (H) 13.0 - 46.6 %   MCV 93 79 - 97 fL   MCH 31.6 26.6 - 33.0 pg   MCHC 33.9 31.5 - 35.7 g/dL   RDW 86.5 78.4 - 69.6 %   Platelets 335 150 - 450 x10E3/uL   Neutrophils 69 Not Estab. %   Lymphs 24 Not Estab. %   Monocytes 5 Not Estab. %   Eos 1 Not Estab. %   Basos 1 Not Estab. %   Neutrophils Absolute 4.8 1.4 - 7.0 x10E3/uL    Lymphocytes Absolute 1.7 0.7 - 3.1 x10E3/uL   Monocytes Absolute 0.3 0.1 - 0.9 x10E3/uL   EOS (ABSOLUTE) 0.1 0.0 - 0.4 x10E3/uL   Basophils Absolute 0.0 0.0 - 0.2 x10E3/uL   Immature Granulocytes 0 Not Estab. %   Immature Grans (Abs) 0.0 0.0 - 0.1 x10E3/uL  Lipid panel  Result Value Ref Range   Cholesterol, Total 159 100 - 199 mg/dL   Triglycerides 295 0 - 149 mg/dL   HDL 59 >28 mg/dL   VLDL Cholesterol Cal 25 5 - 40 mg/dL   LDL Chol Calc (NIH) 75 0 - 99 mg/dL   Chol/HDL Ratio 2.7 0.0 - 4.4 ratio  VITAMIN D 25 Hydroxy (Vit-D Deficiency, Fractures)  Result Value Ref Range   Vit D, 25-Hydroxy 39.5 30.0 - 100.0 ng/mL  Vitamin B12  Result Value Ref Range   Vitamin B-12 441 232 - 1,245 pg/mL         Assessment & Plan:   Problem List Items Addressed This Visit     Hypertension associated with diabetes (HCC)    Chronic hypertension in the setting of diabetes and hyperlipidemia.currently managed with metoprolol. BP well controlled. Plan: - Labs pending - Continue current medication management.       Relevant Medications   Semaglutide,0.25 or 0.5MG /DOS, (OZEMPIC, 0.25 OR 0.5 MG/DOSE,) 2 MG/3ML SOPN   Semaglutide,0.25 or 0.5MG /DOS, (OZEMPIC, 0.25 OR 0.5 MG/DOSE,) 2 MG/3ML SOPN   Semaglutide, 1 MG/DOSE, 4 MG/3ML SOPN   empagliflozin (JARDIANCE) 10 MG TABS tablet   atorvastatin (LIPITOR) 40 MG tablet   citalopram (CELEXA) 20 MG tablet   levocetirizine (XYZAL) 5 MG tablet   metoprolol tartrate (LOPRESSOR) 100 MG tablet   spironolactone (ALDACTONE) 100 MG tablet   Other Relevant Orders   Hemoglobin A1c (Completed)   Comprehensive metabolic panel (Completed)  CBC with Differential/Platelet (Completed)   Lipid panel (Completed)   VITAMIN D 25 Hydroxy (Vit-D Deficiency, Fractures) (Completed)   Vitamin B12 (Completed)   PCOS (polycystic ovarian syndrome)    PCOS managing chronic conditions of hirsutism, diabetes, and HLD with medications currently. Discussion of improved  management of blood sugar and weight with GLP-1. She is interested in this.  Plan: - Continue current medication - Start semaglutide once weekly       Relevant Medications   Semaglutide,0.25 or 0.5MG /DOS, (OZEMPIC, 0.25 OR 0.5 MG/DOSE,) 2 MG/3ML SOPN   Semaglutide,0.25 or 0.5MG /DOS, (OZEMPIC, 0.25 OR 0.5 MG/DOSE,) 2 MG/3ML SOPN   Semaglutide, 1 MG/DOSE, 4 MG/3ML SOPN   empagliflozin (JARDIANCE) 10 MG TABS tablet   atorvastatin (LIPITOR) 40 MG tablet   citalopram (CELEXA) 20 MG tablet   levocetirizine (XYZAL) 5 MG tablet   metoprolol tartrate (LOPRESSOR) 100 MG tablet   spironolactone (ALDACTONE) 100 MG tablet   Other Relevant Orders   Hemoglobin A1c (Completed)   Comprehensive metabolic panel (Completed)   CBC with Differential/Platelet (Completed)   Lipid panel (Completed)   VITAMIN D 25 Hydroxy (Vit-D Deficiency, Fractures) (Completed)   Vitamin B12 (Completed)   Malignant neoplasm of upper-outer quadrant of right breast in female, estrogen receptor positive (HCC)    Currently managed on anastrozole for ongoing treatment. No concerns at this time.       Encounter for annual physical exam - Primary    CPE completed today.   Labs ordered. Will make changes as necessary based on results.  Review of HM activities and recommendations discussed and provided on AVS Anticipatory guidance, diet, and exercise recommendations provided.  Medications, allergies, and hx reviewed and updated as necessary.  Plan to f/u with CPE in 1 year or sooner for acute/chronic health needs as directed.        Relevant Orders   Hemoglobin A1c (Completed)   Comprehensive metabolic panel (Completed)   CBC with Differential/Platelet (Completed)   Lipid panel (Completed)   VITAMIN D 25 Hydroxy (Vit-D Deficiency, Fractures) (Completed)   Vitamin B12 (Completed)   Type 2 diabetes mellitus with hyperglycemia, without long-term current use of insulin (HCC)    Chronic with associated conditions of HTN,  HLD, obesity, and PCOS. Discussion of blood sugar management with GLP-1 today to also help with co morbidities. She is interested in this.  Plan: - start semaglutide once weekly - continue to work on diet and exercise - monitor blood sugars at least once daily.       Relevant Medications   Semaglutide,0.25 or 0.5MG /DOS, (OZEMPIC, 0.25 OR 0.5 MG/DOSE,) 2 MG/3ML SOPN   Semaglutide,0.25 or 0.5MG /DOS, (OZEMPIC, 0.25 OR 0.5 MG/DOSE,) 2 MG/3ML SOPN   Semaglutide, 1 MG/DOSE, 4 MG/3ML SOPN   empagliflozin (JARDIANCE) 10 MG TABS tablet   atorvastatin (LIPITOR) 40 MG tablet   citalopram (CELEXA) 20 MG tablet   levocetirizine (XYZAL) 5 MG tablet   metoprolol tartrate (LOPRESSOR) 100 MG tablet   spironolactone (ALDACTONE) 100 MG tablet   Other Relevant Orders   Hemoglobin A1c (Completed)   Comprehensive metabolic panel (Completed)   CBC with Differential/Platelet (Completed)   Lipid panel (Completed)   VITAMIN D 25 Hydroxy (Vit-D Deficiency, Fractures) (Completed)   Vitamin B12 (Completed)   Hyperlipidemia associated with type 2 diabetes mellitus (HCC)    Chronic in setting of type 2 diabetes and hypertension.  Currently managed with atorvastatin. Plan: - Labs pending today. - Continue current medication management      Relevant Medications  Semaglutide,0.25 or 0.5MG /DOS, (OZEMPIC, 0.25 OR 0.5 MG/DOSE,) 2 MG/3ML SOPN   Semaglutide,0.25 or 0.5MG /DOS, (OZEMPIC, 0.25 OR 0.5 MG/DOSE,) 2 MG/3ML SOPN   Semaglutide, 1 MG/DOSE, 4 MG/3ML SOPN   empagliflozin (JARDIANCE) 10 MG TABS tablet   atorvastatin (LIPITOR) 40 MG tablet   citalopram (CELEXA) 20 MG tablet   levocetirizine (XYZAL) 5 MG tablet   metoprolol tartrate (LOPRESSOR) 100 MG tablet   spironolactone (ALDACTONE) 100 MG tablet   Other Relevant Orders   Hemoglobin A1c (Completed)   Comprehensive metabolic panel (Completed)   CBC with Differential/Platelet (Completed)   Lipid panel (Completed)   VITAMIN D 25 Hydroxy (Vit-D Deficiency,  Fractures) (Completed)   Vitamin B12 (Completed)   Other Visit Diagnoses     High risk medication use       Relevant Orders   Hemoglobin A1c (Completed)   Comprehensive metabolic panel (Completed)   CBC with Differential/Platelet (Completed)   Lipid panel (Completed)   VITAMIN D 25 Hydroxy (Vit-D Deficiency, Fractures) (Completed)   Vitamin B12 (Completed)   Vitamin D deficiency       Relevant Orders   Hemoglobin A1c (Completed)   Comprehensive metabolic panel (Completed)   CBC with Differential/Platelet (Completed)   Lipid panel (Completed)   VITAMIN D 25 Hydroxy (Vit-D Deficiency, Fractures) (Completed)   Vitamin B12 (Completed)   Hypertension, unspecified type       Relevant Medications   atorvastatin (LIPITOR) 40 MG tablet   metoprolol tartrate (LOPRESSOR) 100 MG tablet   spironolactone (ALDACTONE) 100 MG tablet          Follow up plan: Return in about 4 weeks (around 09/06/2022) for med management.  NEXT PREVENTATIVE PHYSICAL DUE IN 1 YEAR.  PATIENT COUNSELING PROVIDED FOR ALL ADULT PATIENTS: A well balanced diet low in saturated fats, cholesterol, and moderation in carbohydrates.  This can be as simple as monitoring portion sizes and cutting back on sugary beverages such as soda and juice to start with.    Daily water consumption of at least 64 ounces.  Physical activity at least 180 minutes per week.  If just starting out, start 10 minutes a day and work your way up.   This can be as simple as taking the stairs instead of the elevator and walking 2-3 laps around the office  purposefully every day.   STD protection, partner selection, and regular testing if high risk.  Limited consumption of alcoholic beverages if alcohol is consumed. For men, I recommend no more than 14 alcoholic beverages per week, spread out throughout the week (max 2 per day). Avoid "binge" drinking or consuming large quantities of alcohol in one setting.  Please let me know if you feel you  may need help with reduction or quitting alcohol consumption.   Avoidance of nicotine, if used. Please let me know if you feel you may need help with reduction or quitting nicotine use.   Daily mental health attention. This can be in the form of 5 minute daily meditation, prayer, journaling, yoga, reflection, etc.  Purposeful attention to your emotions and mental state can significantly improve your overall wellbeing  and  Health.  Please know that I am here to help you with all of your health care goals and am happy to work with you to find a solution that works best for you.  The greatest advice I have received with any changes in life are to take it one step at a time, that even means if all you can focus on  is the next 60 seconds, then do that and celebrate your victories.  With any changes in life, you will have set backs, and that is OK. The important thing to remember is, if you have a set back, it is not a failure, it is an opportunity to try again! Screening Testing Mammogram Every 1 -2 years based on history and risk factors Starting at age 71 Pap Smear Ages 21-39 every 3 years Ages 24-65 every 5 years with HPV testing More frequent testing may be required based on results and history Colon Cancer Screening Every 1-10 years based on test performed, risk factors, and history Starting at age 13 Bone Density Screening Every 2-10 years based on history Starting at age 69 for women Recommendations for men differ based on medication usage, history, and risk factors AAA Screening One time ultrasound Men 72-53 years old who have every smoked Lung Cancer Screening Low Dose Lung CT every 12 months Age 99-80 years with a 30 pack-year smoking history who still smoke or who have quit within the last 15 years   Screening Labs Routine  Labs: Complete Blood Count (CBC), Complete Metabolic Panel (CMP), Cholesterol (Lipid Panel) Every 6-12 months based on history and medications May be  recommended more frequently based on current conditions or previous results Hemoglobin A1c Lab Every 3-12 months based on history and previous results Starting at age 37 or earlier with diagnosis of diabetes, high cholesterol, BMI >26, and/or risk factors Frequent monitoring for patients with diabetes to ensure blood sugar control Thyroid Panel (TSH) Every 6 months based on history, symptoms, and risk factors May be repeated more often if on medication HIV One time testing for all patients 54 and older May be repeated more frequently for patients with increased risk factors or exposure Hepatitis C One time testing for all patients 9 and older May be repeated more frequently for patients with increased risk factors or exposure Gonorrhea, Chlamydia Every 12 months for all sexually active persons 13-24 years Additional monitoring may be recommended for those who are considered high risk or who have symptoms Every 12 months for any woman on birth control, regardless of sexual activity PSA Men 8-68 years old with risk factors Additional screening may be recommended from age 79-69 based on risk factors, symptoms, and history  Vaccine Recommendations Tetanus Booster All adults every 10 years Flu Vaccine All patients 6 months and older every year COVID Vaccine All patients 12 years and older Initial dosing with booster May recommend additional booster based on age and health history HPV Vaccine 2 doses all patients age 70-26 Dosing may be considered for patients over 26 Shingles Vaccine (Shingrix) 2 doses all adults 55 years and older Pneumonia (Pneumovax 61) All adults 65 years and older May recommend earlier dosing based on health history One year apart from Prevnar 50 Pneumonia (Prevnar 32) All adults 65 years and older Dosed 1 year after Pneumovax 23 Pneumonia (Prevnar 20) One time alternative to the two dosing of 13 and 23 For all adults with initial dose of 23, 20 is  recommended 1 year later For all adults with initial dose of 13, 23 is still recommended as second option 1 year later

## 2022-08-09 NOTE — Patient Instructions (Signed)

## 2022-08-10 LAB — COMPREHENSIVE METABOLIC PANEL
ALT: 23 IU/L (ref 0–32)
AST: 19 IU/L (ref 0–40)
Albumin/Globulin Ratio: 2.1 (ref 1.2–2.2)
Albumin: 4.7 g/dL (ref 3.8–4.9)
Alkaline Phosphatase: 109 IU/L (ref 44–121)
BUN/Creatinine Ratio: 12 (ref 9–23)
BUN: 14 mg/dL (ref 6–24)
Bilirubin Total: 0.9 mg/dL (ref 0.0–1.2)
CO2: 20 mmol/L (ref 20–29)
Calcium: 10.6 mg/dL — ABNORMAL HIGH (ref 8.7–10.2)
Chloride: 103 mmol/L (ref 96–106)
Creatinine, Ser: 1.15 mg/dL — ABNORMAL HIGH (ref 0.57–1.00)
Globulin, Total: 2.2 g/dL (ref 1.5–4.5)
Glucose: 86 mg/dL (ref 70–99)
Potassium: 4.8 mmol/L (ref 3.5–5.2)
Sodium: 142 mmol/L (ref 134–144)
Total Protein: 6.9 g/dL (ref 6.0–8.5)
eGFR: 56 mL/min/{1.73_m2} — ABNORMAL LOW (ref >59)

## 2022-08-10 LAB — VITAMIN B12: Vitamin B-12: 441 pg/mL (ref 232–1245)

## 2022-08-10 LAB — CBC WITH DIFFERENTIAL/PLATELET
Basophils Absolute: 0 10*3/uL (ref 0.0–0.2)
Basos: 1 %
EOS (ABSOLUTE): 0.1 10*3/uL (ref 0.0–0.4)
Eos: 1 %
Hematocrit: 49.2 % — ABNORMAL HIGH (ref 34.0–46.6)
Hemoglobin: 16.7 g/dL — ABNORMAL HIGH (ref 11.1–15.9)
Immature Grans (Abs): 0 10*3/uL (ref 0.0–0.1)
Immature Granulocytes: 0 %
Lymphocytes Absolute: 1.7 10*3/uL (ref 0.7–3.1)
Lymphs: 24 %
MCH: 31.6 pg (ref 26.6–33.0)
MCHC: 33.9 g/dL (ref 31.5–35.7)
MCV: 93 fL (ref 79–97)
Monocytes Absolute: 0.3 10*3/uL (ref 0.1–0.9)
Monocytes: 5 %
Neutrophils Absolute: 4.8 10*3/uL (ref 1.4–7.0)
Neutrophils: 69 %
Platelets: 335 10*3/uL (ref 150–450)
RBC: 5.29 x10E6/uL — ABNORMAL HIGH (ref 3.77–5.28)
RDW: 12.6 % (ref 11.7–15.4)
WBC: 7 10*3/uL (ref 3.4–10.8)

## 2022-08-10 LAB — HEMOGLOBIN A1C
Est. average glucose Bld gHb Est-mCnc: 128 mg/dL
Hgb A1c MFr Bld: 6.1 % — ABNORMAL HIGH (ref 4.8–5.6)

## 2022-08-10 LAB — LIPID PANEL
Chol/HDL Ratio: 2.7 ratio (ref 0.0–4.4)
Cholesterol, Total: 159 mg/dL (ref 100–199)
HDL: 59 mg/dL (ref >39)
LDL Chol Calc (NIH): 75 mg/dL (ref 0–99)
Triglycerides: 148 mg/dL (ref 0–149)
VLDL Cholesterol Cal: 25 mg/dL (ref 5–40)

## 2022-08-10 LAB — VITAMIN D 25 HYDROXY (VIT D DEFICIENCY, FRACTURES): Vit D, 25-Hydroxy: 39.5 ng/mL (ref 30.0–100.0)

## 2022-08-12 ENCOUNTER — Ambulatory Visit: Payer: Commercial Managed Care - PPO | Attending: Hematology and Oncology

## 2022-08-12 ENCOUNTER — Other Ambulatory Visit (HOSPITAL_COMMUNITY): Payer: Self-pay

## 2022-08-12 VITALS — Wt 175.4 lb

## 2022-08-12 DIAGNOSIS — H5213 Myopia, bilateral: Secondary | ICD-10-CM | POA: Diagnosis not present

## 2022-08-12 DIAGNOSIS — Z483 Aftercare following surgery for neoplasm: Secondary | ICD-10-CM | POA: Insufficient documentation

## 2022-08-12 LAB — HM DIABETES EYE EXAM

## 2022-08-12 NOTE — Therapy (Signed)
OUTPATIENT PHYSICAL THERAPY SOZO SCREENING NOTE   Patient Name: Nicole Khan MRN: [id number] DOB:25-Sep-1965, 57 y.o., female Today's Date: 08/12/2022  PCP: Tollie Eth, NP REFERRING PROVIDER: Sigmund Hazel, MD   PT End of Session - 08/12/22 1102     Visit Number 4   # unchanged due to screen only   PT Start Time 1101    PT Stop Time 1105    PT Time Calculation (min) 4 min    Activity Tolerance Patient tolerated treatment well    Behavior During Therapy [facility] for tasks assessed/performed             Past Medical History:  Diagnosis Date   Abnormal mammogram of left breast 09/26/2017   Abnormal Pap smear of cervix    Anxiety    Asthma    exercise induced asthma   Breast cancer (HCC)    Cancer (HCC)    Depression    Diabetes mellitus without complication (HCC)    type 2   Family history of breast cancer 04/11/2020   Family history of prostate cancer 04/11/2020   Family history of thyroid cancer 04/11/2020   Fracture dislocation of right ankle joint 06/04/2013   Fracture of distal fibula 06/04/2013   H/O bilateral salpingectomy 05/2015   History of kidney stones    passed stones, no surgery required   Hypercholesteremia    Hypertension    Personal history of radiation therapy    Polycystic ovary disease    PONV (postoperative nausea and vomiting)    Sleep apnea 05/2017   uses cpap   SVD (spontaneous vaginal delivery)    x 1   Past Surgical History:  Procedure Laterality Date   BREAST BIOPSY Right 02/29/2020   BREAST EXCISIONAL BIOPSY Left 2019   Benign   BREAST LUMPECTOMY     BREAST LUMPECTOMY WITH RADIOACTIVE SEED AND SENTINEL LYMPH NODE BIOPSY Right 04/20/2020   Procedure: RIGHT BREAST LUMPECTOMY WITH RADIOACTIVE SEED AND SENTINEL LYMPH NODE BIOPSY;  Surgeon: Griselda Miner, MD;  Location: [facility] OR;  Service: General;  Laterality: Right;   BREAST LUMPECTOMY WITH RADIOACTIVE SEED LOCALIZATION Left 09/26/2017   Procedure: LEFT BREAST LUMPECTOMY WITH  RADIOACTIVE SEED LOCALIZATION ERAS PATHWAY;  Surgeon: Claud Kelp, MD;  Location: [facility];  Service: General;  Laterality: Left;   BREAST LUMPECTOMY WITH RADIOACTIVE SEED LOCALIZATION Right 03/29/2021   Procedure: RIGHT BREAST LUMPECTOMY WITH RADIOACTIVE SEED LOCALIZATION;  Surgeon: Griselda Miner, MD;  Location: [facility] OR;  Service: General;  Laterality: Right;   CARPAL TUNNEL RELEASE     bilateral   CERVICAL CERCLAGE  07/1991; 05/1999   x 2   CESAREAN SECTION     x 1   CHOLECYSTECTOMY, LAPAROSCOPIC  1996   COLONOSCOPY WITH PROPOFOL N/A 06/24/2017   Procedure: COLONOSCOPY WITH PROPOFOL;  Surgeon: Charna Elizabeth, MD;  Location: [facility];  Service: Endoscopy;  Laterality: N/A;   CYSTOSCOPY N/A 06/07/2014   Procedure: CYSTOSCOPY;  Surgeon: Jerene Bears, MD;  Location: [facility];  Service: Gynecology;  Laterality: N/A;   LAPAROSCOPIC BILATERAL SALPINGO OOPHERECTOMY Right 06/07/2014   Procedure: LAPAROSCOPIC RIGHT SALPINGO OOPHORECTOMY/COLLECTION OF PELVIC WASHINGS;  Surgeon: Jerene Bears, MD;  Location: [facility];  Service: Gynecology;  Laterality: Right;   LAPAROSCOPIC UNILATERAL SALPINGECTOMY Left 06/07/2014   Procedure: LAPAROSCOPIC UNILATERAL SALPINGECTOMY;  Surgeon: Jerene Bears, MD;  Location: [facility];  Service: Gynecology;  Laterality: Left;   ORIF ANKLE FRACTURE Right 06/07/2013   Procedure: OPEN REDUCTION INTERNAL FIXATION (  ORIF) ANKLE FRACTURE  FIBULA SYNDESMOSIS;  Surgeon: Loreta Ave, MD;  Location: [facility];  Service: Orthopedics;  Laterality: Right;   RE-EXCISION OF BREAST CANCER,SUPERIOR MARGINS Right 06/01/2020   Procedure: RIGHT BREAST MARGIN REEXCISION;  Surgeon: Griselda Miner, MD;  Location: [facility] OR;  Service: General;  Laterality: Right;   TRIGGER FINGER RELEASE Right 01/30/2021   Patient Active Problem List   Diagnosis Date Noted   Type 2 diabetes mellitus with hyperglycemia, without long-term current use of insulin (HCC) 08/09/2022    Hyperlipidemia associated with type 2 diabetes mellitus (HCC) 08/09/2022   Allergic rhinitis 11/08/2020   Exercise induced bronchospasm 11/08/2020   Encounter for annual physical exam 04/17/2020   Family history of breast cancer 04/11/2020   Family history of prostate cancer 04/11/2020   Family history of thyroid cancer 04/11/2020   Malignant neoplasm of upper-outer quadrant of right breast in female, estrogen receptor positive (HCC) 03/21/2020   Abnormal mammogram of left breast 09/26/2017   Bilateral ovarian cysts 05/23/2014   Fracture of distal fibula 06/04/2013   Hypertension associated with diabetes (HCC) 04/07/2013   PCOS (polycystic ovarian syndrome) 04/07/2013   Renal calculi 04/07/2013   Adjustment disorder with mixed anxiety and depressed mood 04/07/2013    REFERRING DIAG: right breast cancer at risk for lymphedema  THERAPY DIAG: Aftercare following surgery for neoplasm  PERTINENT HISTORY: right breast cancer diagnosed with bio Pt had reexcision on June 01, 2020.  biopsy on 02/29/20 showed invasive ductal carcinoma with DCIS, grade 1, HER-2 negative (1+), ER/PR >95%, Ki 67 5%. Surgery is scheduled for Jan 20 and she will have radiation.  Past history includes, DM, sleep apnea, bilaterl carpal tunnel surgery( 2006) and right trigger finger surgery ( 2011)   PRECAUTIONS: right UE Lymphedema risk, None  SUBJECTIVE: Pt returns for her 6 month L-Dex screen.   PAIN:  Are you having pain? No  SOZO SCREENING: Patient was assessed today using the SOZO machine to determine the lymphedema index score. This was compared to her baseline score. It was determined that she is within the recommended range when compared to her baseline and no further action is needed at this time. She will continue SOZO screenings. These are done every 3 months for 2 years post operatively followed by every 6 months for 2 years, and then annually.   L-DEX FLOWSHEETS - 08/12/22 1100       L-DEX LYMPHEDEMA  SCREENING   Measurement Type Unilateral    L-DEX MEASUREMENT EXTREMITY Upper Extremity    POSITION  Standing    DOMINANT SIDE Right    At Risk Side Right    BASELINE SCORE (UNILATERAL) 1.8    L-DEX SCORE (UNILATERAL) -3    VALUE CHANGE (UNILAT) -4.8              Hermenia Bers, PTA 08/12/2022, 11:03 AM

## 2022-08-14 ENCOUNTER — Encounter: Payer: Self-pay | Admitting: Nurse Practitioner

## 2022-08-14 DIAGNOSIS — F4323 Adjustment disorder with mixed anxiety and depressed mood: Secondary | ICD-10-CM

## 2022-08-15 ENCOUNTER — Other Ambulatory Visit: Payer: Self-pay | Admitting: Nurse Practitioner

## 2022-08-15 ENCOUNTER — Other Ambulatory Visit (HOSPITAL_COMMUNITY): Payer: Self-pay

## 2022-08-15 DIAGNOSIS — E282 Polycystic ovarian syndrome: Secondary | ICD-10-CM

## 2022-08-15 DIAGNOSIS — E1169 Type 2 diabetes mellitus with other specified complication: Secondary | ICD-10-CM

## 2022-08-15 DIAGNOSIS — E1159 Type 2 diabetes mellitus with other circulatory complications: Secondary | ICD-10-CM

## 2022-08-15 DIAGNOSIS — E1165 Type 2 diabetes mellitus with hyperglycemia: Secondary | ICD-10-CM

## 2022-08-15 MED ORDER — BUPROPION HCL ER (XL) 150 MG PO TB24
450.0000 mg | ORAL_TABLET | Freq: Every day | ORAL | 3 refills | Status: DC
Start: 2022-08-15 — End: 2022-11-29
  Filled 2022-08-15: qty 270, 90d supply, fill #0
  Filled 2022-11-26: qty 30, 30d supply, fill #1

## 2022-08-16 ENCOUNTER — Other Ambulatory Visit (HOSPITAL_COMMUNITY): Payer: Self-pay

## 2022-08-22 ENCOUNTER — Other Ambulatory Visit (HOSPITAL_COMMUNITY): Payer: Self-pay

## 2022-08-23 ENCOUNTER — Encounter: Payer: Self-pay | Admitting: Nurse Practitioner

## 2022-08-28 ENCOUNTER — Encounter: Payer: Self-pay | Admitting: Nurse Practitioner

## 2022-08-28 NOTE — Assessment & Plan Note (Signed)
Chronic with associated conditions of HTN, HLD, obesity, and PCOS. Discussion of blood sugar management with GLP-1 today to also help with co morbidities. She is interested in this.  Plan: - start semaglutide once weekly - continue to work on diet and exercise - monitor blood sugars at least once daily.

## 2022-08-28 NOTE — Assessment & Plan Note (Signed)
Chronic hypertension in the setting of diabetes and hyperlipidemia.currently managed with metoprolol. BP well controlled. Plan: - Labs pending - Continue current medication management.

## 2022-08-28 NOTE — Assessment & Plan Note (Signed)
PCOS managing chronic conditions of hirsutism, diabetes, and HLD with medications currently. Discussion of improved management of blood sugar and weight with GLP-1. She is interested in this.  Plan: - Continue current medication - Start semaglutide once weekly

## 2022-08-28 NOTE — Assessment & Plan Note (Signed)
CPE completed today.   Labs ordered. Will make changes as necessary based on results.  Review of HM activities and recommendations discussed and provided on AVS Anticipatory guidance, diet, and exercise recommendations provided.  Medications, allergies, and hx reviewed and updated as necessary.  Plan to f/u with CPE in 1 year or sooner for acute/chronic health needs as directed.   

## 2022-08-28 NOTE — Assessment & Plan Note (Signed)
Chronic in setting of type 2 diabetes and hypertension.  Currently managed with atorvastatin. Plan: - Labs pending today. - Continue current medication management

## 2022-08-28 NOTE — Assessment & Plan Note (Signed)
Currently managed on anastrozole for ongoing treatment. No concerns at this time.

## 2022-08-30 ENCOUNTER — Other Ambulatory Visit (HOSPITAL_COMMUNITY): Payer: Self-pay

## 2022-09-09 ENCOUNTER — Other Ambulatory Visit (HOSPITAL_COMMUNITY): Payer: Self-pay

## 2022-09-12 NOTE — Progress Notes (Deleted)
57 y.o. [id number] Married Caucasian female here for annual exam.    PCP:     Patient's last menstrual period was 12/31/2015 (within weeks).           Sexually active: {yes no:314532}  The current method of family planning is post menopausal status.    Exercising: {yes no:314532}  {types:19826} Smoker:  no  Health Maintenance: Pap:  09/24/21 ASCUS: HR HPV neg, 09/21/20 neg: HR HPV neg History of abnormal Pap:  yes MMG:  05/14/22 Breast Density Cat b, BI-RADS CAT 2 benign Colonoscopy:  06/24/17 BMD:   10/26/21  Result  osteopenic TDaP:  05/14/13 Gardasil:   no HIV: neg in the past Hep C: no Screening Labs:  Hb today: ***, Urine today: ***   reports that she has never smoked. She has never used smokeless tobacco. She reports that she does not drink alcohol and does not use drugs.  Past Medical History:  Diagnosis Date   Abnormal mammogram of left breast 09/26/2017   Abnormal Pap smear of cervix    Anxiety    Asthma    exercise induced asthma   Breast cancer (HCC)    Cancer (HCC)    Depression    Diabetes mellitus without complication (HCC)    type 2   Family history of breast cancer 04/11/2020   Family history of breast cancer 04/11/2020   Family history of prostate cancer 04/11/2020   Family history of prostate cancer 04/11/2020   Family history of thyroid cancer 04/11/2020   Family history of thyroid cancer 04/11/2020   Fracture dislocation of right ankle joint 06/04/2013   Fracture of distal fibula 06/04/2013   H/O bilateral salpingectomy 05/2015   History of kidney stones    passed stones, no surgery required   Hypercholesteremia    Hypertension    Personal history of radiation therapy    Polycystic ovary disease    PONV (postoperative nausea and vomiting)    Sleep apnea 05/2017   uses cpap   SVD (spontaneous vaginal delivery)    x 1    Past Surgical History:  Procedure Laterality Date   BREAST BIOPSY Right 02/29/2020   BREAST EXCISIONAL BIOPSY Left 2019    Benign   BREAST LUMPECTOMY     BREAST LUMPECTOMY WITH RADIOACTIVE SEED AND SENTINEL LYMPH NODE BIOPSY Right 04/20/2020   Procedure: RIGHT BREAST LUMPECTOMY WITH RADIOACTIVE SEED AND SENTINEL LYMPH NODE BIOPSY;  Surgeon: Griselda Miner, MD;  Location: [facility] OR;  Service: General;  Laterality: Right;   BREAST LUMPECTOMY WITH RADIOACTIVE SEED LOCALIZATION Left 09/26/2017   Procedure: LEFT BREAST LUMPECTOMY WITH RADIOACTIVE SEED LOCALIZATION ERAS PATHWAY;  Surgeon: Claud Kelp, MD;  Location: [facility];  Service: General;  Laterality: Left;   BREAST LUMPECTOMY WITH RADIOACTIVE SEED LOCALIZATION Right 03/29/2021   Procedure: RIGHT BREAST LUMPECTOMY WITH RADIOACTIVE SEED LOCALIZATION;  Surgeon: Griselda Miner, MD;  Location: [facility] OR;  Service: General;  Laterality: Right;   CARPAL TUNNEL RELEASE     bilateral   CERVICAL CERCLAGE  07/1991; 05/1999   x 2   CESAREAN SECTION     x 1   CHOLECYSTECTOMY, LAPAROSCOPIC  1996   COLONOSCOPY WITH PROPOFOL N/A 06/24/2017   Procedure: COLONOSCOPY WITH PROPOFOL;  Surgeon: Charna Elizabeth, MD;  Location: [facility];  Service: Endoscopy;  Laterality: N/A;   CYSTOSCOPY N/A 06/07/2014   Procedure: CYSTOSCOPY;  Surgeon: Jerene Bears, MD;  Location: [facility];  Service: Gynecology;  Laterality: N/A;   LAPAROSCOPIC BILATERAL SALPINGO  OOPHERECTOMY Right 06/07/2014   Procedure: LAPAROSCOPIC RIGHT SALPINGO OOPHORECTOMY/COLLECTION OF PELVIC WASHINGS;  Surgeon: Jerene Bears, MD;  Location: [facility];  Service: Gynecology;  Laterality: Right;   LAPAROSCOPIC UNILATERAL SALPINGECTOMY Left 06/07/2014   Procedure: LAPAROSCOPIC UNILATERAL SALPINGECTOMY;  Surgeon: Jerene Bears, MD;  Location: [facility];  Service: Gynecology;  Laterality: Left;   ORIF ANKLE FRACTURE Right 06/07/2013   Procedure: OPEN REDUCTION INTERNAL FIXATION (ORIF) ANKLE FRACTURE  FIBULA SYNDESMOSIS;  Surgeon: Loreta Ave, MD;  Location: [facility];  Service: Orthopedics;  Laterality:  Right;   RE-EXCISION OF BREAST CANCER,SUPERIOR MARGINS Right 06/01/2020   Procedure: RIGHT BREAST MARGIN REEXCISION;  Surgeon: Chevis Pretty III, MD;  Location: [facility];  Service: General;  Laterality: Right;   TRIGGER FINGER RELEASE Right 01/30/2021    Current Outpatient Medications  Medication Sig Dispense Refill   acetaminophen (TYLENOL) 500 MG tablet Take 1,000 mg by mouth every 6 (six) hours as needed for headache.     anastrozole (ARIMIDEX) 1 MG tablet Take 1 tablet (1 mg total) by mouth daily. 90 tablet 3   atorvastatin (LIPITOR) 40 MG tablet Take 1 tablet (40 mg total) by mouth at bedtime. 90 tablet 3   buPROPion (WELLBUTRIN XL) 150 MG 24 hr tablet Take 3 tablets (450 mg total) by mouth daily. 270 tablet 3   citalopram (CELEXA) 20 MG tablet Take 1 tablet (20 mg total) by mouth daily. 90 tablet 3   empagliflozin (JARDIANCE) 10 MG TABS tablet Take 1 tablet (10 mg total) by mouth daily. 90 tablet 3   levocetirizine (XYZAL) 5 MG tablet Take 1 tablet (5 mg total) by mouth every evening. 90 tablet 3   metoprolol tartrate (LOPRESSOR) 100 MG tablet Take 1 tablet (100 mg total) by mouth 2 (two) times daily. 180 tablet 3   phenylephrine (SUDAFED PE) 10 MG TABS tablet Take 10 mg by mouth every 6 (six) hours as needed (for congestion).     Semaglutide, 1 MG/DOSE, 4 MG/3ML SOPN Inject 1 mg as directed once a week. 3 mL 0   Semaglutide,0.25 or 0.5MG /DOS, (OZEMPIC, 0.25 OR 0.5 MG/DOSE,) 2 MG/3ML SOPN Inject 0.25mg  once a week for 4 weeks then increase to 0.5mg  once a week for 2 weeks to complete first pen. 3 mL 0   Semaglutide,0.25 or 0.5MG /DOS, (OZEMPIC, 0.25 OR 0.5 MG/DOSE,) 2 MG/3ML SOPN Inject 0.5 mg into the skin once a week for 4 weeks 3 mL 0   spironolactone (ALDACTONE) 100 MG tablet Take 1 tablet (100 mg total) by mouth 2 (two) times daily. 180 tablet 3   No current facility-administered medications for this visit.    Family History  Problem Relation Age of Onset   Hypertension Mother     Infertility Mother    Deep vein thrombosis Mother    Anxiety disorder Mother    Depression Mother    Hepatitis C Mother        blood transfusion   Aneurysm Mother        Japanese   Cancer Father        Gallbladder; dx 31s   Hypertension Father    Anxiety disorder Father    Depression Father    Multiple births Sister    Thyroid disease Sister    Anxiety disorder Sister    Depression Sister    Breast cancer Sister 109   Thyroid cancer Sister 7       unknown type   Hypertension Brother    Anxiety disorder  Brother    Depression Brother    Prostate cancer Paternal Uncle        dx 13s   Lung cancer Half-Sister 82       maternal half sister; no smoking hx    Review of Systems  Exam:   LMP 12/31/2015 (Within Weeks)     General appearance: alert, cooperative and appears stated age Head: normocephalic, without obvious abnormality, atraumatic Neck: no adenopathy, supple, symmetrical, trachea midline and thyroid normal to inspection and palpation Lungs: clear to auscultation bilaterally Breasts: normal appearance, no masses or tenderness, No nipple retraction or dimpling, No nipple discharge or bleeding, No axillary adenopathy Heart: regular rate and rhythm Abdomen: soft, non-tender; no masses, no organomegaly Extremities: extremities normal, atraumatic, no cyanosis or edema Skin: skin color, texture, turgor normal. No rashes or lesions Lymph nodes: cervical, supraclavicular, and axillary nodes normal. Neurologic: grossly normal  Pelvic: External genitalia:  no lesions              No abnormal inguinal nodes palpated.              Urethra:  normal appearing urethra with no masses, tenderness or lesions              Bartholins and Skenes: normal                 Vagina: normal appearing vagina with normal color and discharge, no lesions              Cervix: no lesions              Pap taken: {yes no:314532} Bimanual Exam:  Uterus:  normal size, contour, position, consistency,  mobility, non-tender              Adnexa: no mass, fullness, tenderness              Rectal exam: {yes no:314532}.  Confirms.              Anus:  normal sphincter tone, no lesions  Chaperone was present for exam:  ***  Assessment:   Well woman visit with gynecologic exam.   Plan: Mammogram screening discussed. Self breast awareness reviewed. Pap and HR HPV as above. Guidelines for Calcium, Vitamin D, regular exercise program including cardiovascular and weight bearing exercise.   Follow up annually and prn.   Additional counseling given.  {yes [id number]. _______ minutes face to face time of which over 50% was spent in counseling.    After visit summary provided.    '

## 2022-09-19 ENCOUNTER — Ambulatory Visit: Payer: Managed Care, Other (non HMO) | Admitting: Nurse Practitioner

## 2022-09-19 ENCOUNTER — Encounter: Payer: Self-pay | Admitting: Nurse Practitioner

## 2022-09-19 VITALS — BP 128/84 | HR 87 | Wt 171.4 lb

## 2022-09-19 DIAGNOSIS — E1165 Type 2 diabetes mellitus with hyperglycemia: Secondary | ICD-10-CM | POA: Diagnosis not present

## 2022-09-19 DIAGNOSIS — F4323 Adjustment disorder with mixed anxiety and depressed mood: Secondary | ICD-10-CM

## 2022-09-19 DIAGNOSIS — E1159 Type 2 diabetes mellitus with other circulatory complications: Secondary | ICD-10-CM

## 2022-09-19 DIAGNOSIS — C50411 Malignant neoplasm of upper-outer quadrant of right female breast: Secondary | ICD-10-CM | POA: Diagnosis not present

## 2022-09-19 DIAGNOSIS — Z170 Estrogen receptor positive status [ER+]: Secondary | ICD-10-CM

## 2022-09-19 DIAGNOSIS — I152 Hypertension secondary to endocrine disorders: Secondary | ICD-10-CM

## 2022-09-19 NOTE — Progress Notes (Signed)
Nicole Clamp, DNP, AGNP-c [facility]  [address] [place], [place] [address] [phone]  ESTABLISHED PATIENT- Chronic Health and/or Follow-Up Visit  Blood pressure 128/84, pulse 87, weight 171 lb 6.4 oz (77.7 kg), last menstrual period 12/31/2015.    Nicole Khan is a 57 y.o. year old female presenting today for evaluation and management of chronic conditions.   [name] reports improvement in her mood since her last visit. She has noticed and increase in pleasure in daily activities and feeling less down/depressed.  She reports her appetite has decreased, which she feels is beneficial for her.  She denies feeling bad about herself or having any trouble concentrating.  She also denies any thoughts of self-harm.  She does report ongoing trouble with sleeping, however she reports her energy levels have an proved despite this. She mentions that her insurance does not pay for the 450 mg tablet of bupropion but she has been successful with taking three 150 mg tablets.  She is also still taking 20 mg of Celexa.  She tells me she has not yet started taking her Ozempic as she still has Trulicity available and she is finishing her current stock of that first.  She remains on anastrozole therapy and has 3 more years in her treatment plan.  All ROS negative with exception of what is listed above.   PHYSICAL EXAM Physical Exam Vitals and nursing note reviewed.  Constitutional:      Appearance: Normal appearance.  HENT:     Head: Normocephalic.  Eyes:     Pupils: Pupils are equal, round, and reactive to light.  Cardiovascular:     Rate and Rhythm: Normal rate and regular rhythm.     Pulses: Normal pulses.     Heart sounds: Normal heart sounds.  Pulmonary:     Effort: Pulmonary effort is normal.     Breath sounds: Normal breath sounds.  Musculoskeletal:        General: Normal range of motion.     Cervical back: Normal range of motion.  Skin:    General: Skin  is warm.  Neurological:     General: No focal deficit present.     Mental Status: She is alert and oriented to person, place, and time.  Psychiatric:        Mood and Affect: Mood normal.     PLAN Problem List Items Addressed This Visit     Hypertension associated with diabetes (HCC)    Blood pressure is improved today.  She reports good control with no current symptoms. Plan: No changes in antihypertensive regimen today. Monitor blood pressure regularly at home and report readings greater than 130/80      Adjustment disorder with mixed anxiety and depressed mood - Primary    [name] has had improvement of her mood since increasing bupropion dose.  She is still having some difficulty with sleeping. Plan: Ensure you are taking Wellbutrin in the morning as this can interfere with sleep if taken later in the day. We can consider starting low-dose medication such as trazodone for sleep if this continues.  Please let me know if this does not improve.       Malignant neoplasm of upper-outer quadrant of right breast in female, estrogen receptor positive (HCC)    [name] is still on treatment with anastrozole with 3 years remaining.  She denies any concerns with this at this time. Plan: Continue current therapy as prescribed.      Type 2 diabetes mellitus with hyperglycemia,  without long-term current use of insulin (HCC)    Currently finishing up remaining doses of Trulicity and then plans to switch to Ozempic. Plan: Please let me know if you have any concerns or side effects noted once starting the Ozempic. Continue to monitor diet and exercise for optimal control of blood sugars.       Return in about 6 months (around 03/21/2023) for Med Management 30.  Time: 32 minutes, >50% spent counseling, care coordination, chart review, and documentation.   Nicole Clamp, DNP, AGNP-c

## 2022-09-26 ENCOUNTER — Ambulatory Visit: Payer: 59 | Admitting: Obstetrics and Gynecology

## 2022-10-15 ENCOUNTER — Other Ambulatory Visit: Payer: Self-pay | Admitting: Gastroenterology

## 2022-10-28 NOTE — Assessment & Plan Note (Signed)
Nicole Khan is still on treatment with anastrozole with 3 years remaining.  She denies any concerns with this at this time. Plan: Continue current therapy as prescribed.

## 2022-10-28 NOTE — Assessment & Plan Note (Signed)
[name] has had improvement of her mood since increasing bupropion dose.  She is still having some difficulty with sleeping. Plan: Ensure you are taking Wellbutrin in the morning as this can interfere with sleep if taken later in the day. We can consider starting low-dose medication such as trazodone for sleep if this continues.  Please let me know if this does not improve.

## 2022-10-28 NOTE — Assessment & Plan Note (Signed)
Blood pressure is improved today.  She reports good control with no current symptoms. Plan: No changes in antihypertensive regimen today. Monitor blood pressure regularly at home and report readings greater than 130/80

## 2022-10-28 NOTE — Assessment & Plan Note (Signed)
Currently finishing up remaining doses of Trulicity and then plans to switch to Ozempic. Plan: Please let me know if you have any concerns or side effects noted once starting the Ozempic. Continue to monitor diet and exercise for optimal control of blood sugars.

## 2022-10-30 ENCOUNTER — Other Ambulatory Visit (HOSPITAL_COMMUNITY): Payer: Self-pay

## 2022-10-31 ENCOUNTER — Other Ambulatory Visit: Payer: Self-pay

## 2022-11-05 ENCOUNTER — Other Ambulatory Visit (HOSPITAL_COMMUNITY): Payer: Self-pay

## 2022-11-15 ENCOUNTER — Other Ambulatory Visit (HOSPITAL_COMMUNITY): Payer: Self-pay

## 2022-11-15 MED ORDER — GOLYTELY 236 G PO SOLR
ORAL | 0 refills | Status: DC
  Filled 2022-11-15: qty 4000, 2d supply, fill #0

## 2022-11-18 ENCOUNTER — Other Ambulatory Visit (HOSPITAL_COMMUNITY): Payer: Self-pay

## 2022-11-20 ENCOUNTER — Other Ambulatory Visit (HOSPITAL_COMMUNITY): Payer: Self-pay

## 2022-11-20 ENCOUNTER — Encounter (HOSPITAL_COMMUNITY): Payer: Self-pay | Admitting: Gastroenterology

## 2022-11-20 NOTE — Progress Notes (Addendum)
Anesthesia Review:  PCP: [facility]  Cardiologist : none  Chest x-ray : EKG : Echo : Stress test: Cardiac Cath :  Activity level: can do a flight of stairs without difficutoy  Sleep Study/ CPAP :  uses cpap  Fasting Blood Sugar :      / Checks Blood Sugar -- times a day:   Blood Thinner/ Instructions /Last Dose: ASA / Instructions/ Last Dose :    DM- type 2 - does not check glucose at home  [employer] as of 11/20/22 has not yet approved.  IF approves pt aware last dose on 11/25/22 prior to procedure.   Ozempic- pt takes on Tuesdays- Pt aware last dose on 11/19/22 until after procedure.     PReprocedure phone call completed on 11/20/22.

## 2022-11-22 ENCOUNTER — Telehealth: Payer: Self-pay | Admitting: Nurse Practitioner

## 2022-11-22 NOTE — Telephone Encounter (Unsigned)
P.A. JARDIANCE  PA Case ID #: [id number] Rx #: [id number] Need Help? Call us at [phone] Status sent iconSent to Plan today Drug Jardiance 10MG  tablets ePA cloud logo Form [employer]

## 2022-11-25 NOTE — Telephone Encounter (Signed)
P.A. approved til 11/22/23, sent mychart message

## 2022-11-27 ENCOUNTER — Other Ambulatory Visit (HOSPITAL_COMMUNITY): Payer: Self-pay

## 2022-11-28 NOTE — Anesthesia Preprocedure Evaluation (Addendum)
Anesthesia Evaluation  Patient identified by MRN, date of birth, ID band Patient awake    Reviewed: Allergy & Precautions, NPO status , Patient's Chart, lab work & pertinent test results  History of Anesthesia Complications (+) PONV and history of anesthetic complications (previous Glidescope intubation required)  Airway Mallampati: III  TM Distance: >3 FB Neck ROM: Full    Dental  (+) Missing,    Pulmonary sleep apnea and Continuous Positive Airway Pressure Ventilation    Pulmonary exam normal        Cardiovascular hypertension, Normal cardiovascular exam     Neuro/Psych   Anxiety Depression    negative neurological ROS     GI/Hepatic negative GI ROS, Neg liver ROS,,,  Endo/Other  diabetes, Type 2    Renal/GU negative Renal ROS     Musculoskeletal negative musculoskeletal ROS (+)    Abdominal   Peds  Hematology negative hematology ROS (+)   Anesthesia Other Findings Day of surgery medications reviewed with patient.  Reproductive/Obstetrics                              Anesthesia Physical Anesthesia Plan  ASA: 2  Anesthesia Plan: MAC   Post-op Pain Management: Minimal or no pain anticipated   Induction:   PONV Risk Score and Plan: 3 and Treatment may vary due to age or medical condition and Propofol infusion  Airway Management Planned: Natural Airway and Simple Face Mask  Additional Equipment: None  Intra-op Plan:   Post-operative Plan:   Informed Consent: I have reviewed the patients History and Physical, chart, labs and discussed the procedure including the risks, benefits and alternatives for the proposed anesthesia with the patient or authorized representative who has indicated his/her understanding and acceptance.       Plan Discussed with: CRNA  Anesthesia Plan Comments:          Anesthesia Quick Evaluation

## 2022-11-29 ENCOUNTER — Ambulatory Visit (HOSPITAL_COMMUNITY)
Admission: RE | Admit: 2022-11-29 | Discharge: 2022-11-29 | Disposition: A | Discharge status: Home / Self Care | Payer: Managed Care, Other (non HMO) | Source: Ambulatory Visit | Attending: Gastroenterology | Admitting: Gastroenterology

## 2022-11-29 ENCOUNTER — Other Ambulatory Visit: Payer: Self-pay | Admitting: Nurse Practitioner

## 2022-11-29 ENCOUNTER — Encounter (HOSPITAL_COMMUNITY)
Admission: RE | Disposition: A | Discharge status: Home / Self Care | Payer: Self-pay | Source: Ambulatory Visit | Attending: Gastroenterology

## 2022-11-29 ENCOUNTER — Ambulatory Visit (HOSPITAL_COMMUNITY): Payer: Managed Care, Other (non HMO) | Admitting: Anesthesiology

## 2022-11-29 ENCOUNTER — Other Ambulatory Visit (HOSPITAL_COMMUNITY): Payer: Self-pay

## 2022-11-29 ENCOUNTER — Other Ambulatory Visit: Payer: Self-pay

## 2022-11-29 ENCOUNTER — Encounter (HOSPITAL_COMMUNITY): Payer: Self-pay | Admitting: Gastroenterology

## 2022-11-29 DIAGNOSIS — I10 Essential (primary) hypertension: Secondary | ICD-10-CM | POA: Insufficient documentation

## 2022-11-29 DIAGNOSIS — D126 Benign neoplasm of colon, unspecified: Secondary | ICD-10-CM | POA: Diagnosis not present

## 2022-11-29 DIAGNOSIS — E119 Type 2 diabetes mellitus without complications: Secondary | ICD-10-CM | POA: Insufficient documentation

## 2022-11-29 DIAGNOSIS — D124 Benign neoplasm of descending colon: Secondary | ICD-10-CM | POA: Insufficient documentation

## 2022-11-29 DIAGNOSIS — K635 Polyp of colon: Secondary | ICD-10-CM | POA: Diagnosis not present

## 2022-11-29 DIAGNOSIS — Z1211 Encounter for screening for malignant neoplasm of colon: Secondary | ICD-10-CM

## 2022-11-29 DIAGNOSIS — F4323 Adjustment disorder with mixed anxiety and depressed mood: Secondary | ICD-10-CM

## 2022-11-29 DIAGNOSIS — G4730 Sleep apnea, unspecified: Secondary | ICD-10-CM | POA: Diagnosis not present

## 2022-11-29 HISTORY — PX: POLYPECTOMY: SHX5525

## 2022-11-29 HISTORY — PX: COLONOSCOPY WITH PROPOFOL: SHX5780

## 2022-11-29 LAB — GLUCOSE, CAPILLARY: Glucose-Capillary: 84 mg/dL (ref 70–99)

## 2022-11-29 SURGERY — COLONOSCOPY WITH PROPOFOL
Anesthesia: Monitor Anesthesia Care

## 2022-11-29 MED ORDER — LACTATED RINGERS IV SOLN: INTRAVENOUS | Status: DC | PRN

## 2022-11-29 MED ORDER — SODIUM CHLORIDE 0.9 % IV SOLN: INTRAVENOUS | Status: DC

## 2022-11-29 MED ORDER — BUPROPION HCL ER (XL) 300 MG PO TB24
300.0000 mg | ORAL_TABLET | Freq: Every day | ORAL | 3 refills | Status: DC
Start: 2022-11-29 — End: 2023-03-20
  Filled 2022-11-29: qty 90, 90d supply, fill #0
  Filled 2023-03-03: qty 90, 90d supply, fill #1

## 2022-11-29 MED ORDER — PROPOFOL 500 MG/50ML IV EMUL
INTRAVENOUS | Status: DC | PRN
  Administered 2022-11-29: 100 ug/kg/min via INTRAVENOUS

## 2022-11-29 MED ORDER — BUPROPION HCL ER (XL) 150 MG PO TB24
150.0000 mg | ORAL_TABLET | Freq: Every day | ORAL | 3 refills | Status: DC
Start: 2022-11-29 — End: 2023-03-20
  Filled 2022-11-29: qty 90, 90d supply, fill #0
  Filled 2023-03-03: qty 90, 90d supply, fill #1

## 2022-11-29 MED ORDER — LACTATED RINGERS IV SOLN
INTRAVENOUS | Status: DC
  Administered 2022-11-29: 1000 mL via INTRAVENOUS

## 2022-11-29 MED ORDER — EPHEDRINE SULFATE (PRESSORS) 50 MG/ML IJ SOLN
INTRAMUSCULAR | Status: DC | PRN
  Administered 2022-11-29 (×4): 10 mg via INTRAVENOUS

## 2022-11-29 SURGICAL SUPPLY — 22 items

## 2022-11-29 NOTE — Anesthesia Postprocedure Evaluation (Signed)
Anesthesia Post Note  Patient: Nicole Khan  Procedure(s) Performed: COLONOSCOPY WITH PROPOFOL POLYPECTOMY     Patient location during evaluation: PACU Anesthesia Type: MAC Level of consciousness: awake and alert Pain management: pain level controlled Vital Signs Assessment: post-procedure vital signs reviewed and stable Respiratory status: spontaneous breathing, nonlabored ventilation and respiratory function stable Cardiovascular status: blood pressure returned to baseline Postop Assessment: no apparent nausea or vomiting Anesthetic complications: no   No notable events documented.  Last Vitals:  Vitals:   11/29/22 1102 11/29/22 1110  BP: 99/71 100/65  Pulse: 72 64  Resp: 17 15  Temp:    SpO2: 97% 96%    Last Pain:  Vitals:   11/29/22 1102  TempSrc:   PainSc: 0-No pain                 Shanda Howells

## 2022-11-29 NOTE — Transfer of Care (Signed)
Immediate Anesthesia Transfer of Care Note  Patient: Nicole Khan  Procedure(s) Performed: COLONOSCOPY WITH PROPOFOL POLYPECTOMY  Patient Location: PACU  Anesthesia Type:MAC  Level of Consciousness: awake and alert   Airway & Oxygen Therapy: Patient Spontanous Breathing and Patient connected to face mask oxygen  Post-op Assessment: Report given to RN and Post -op Vital signs reviewed and stable  Post vital signs: Reviewed and stable  Last Vitals:  Vitals Value Taken Time  BP 106/42 11/29/22 1050  Temp    Pulse 73 11/29/22 1050  Resp 20 11/29/22 1050  SpO2 99 % 11/29/22 1050  Vitals shown include unfiled device data.  Last Pain:  Vitals:   11/29/22 0855  TempSrc: Temporal  PainSc: 0-No pain         Complications: No notable events documented.

## 2022-11-29 NOTE — Op Note (Signed)
[facility] Patient Name: Nicole Khan Procedure Date: 11/29/2022 MRN: [id number] Attending MD: Jeani Hawking , MD, [id number] Date of Birth: [birth year] CSN: [id number] Age: 57 Admit Type: Outpatient Procedure:                Colonoscopy Indications:              Screening for colorectal malignant neoplasm Providers:                Jeani Hawking, MD, Marge Duncans, RN, Jacquelyn                            "Jaci" Clelia Croft, RN, Harrington Challenger, Technician Referring MD:              Medicines:                Propofol per Anesthesia Complications:            No immediate complications. Estimated Blood Loss:     Estimated blood loss: none. Procedure:                Pre-Anesthesia Assessment:                           - Prior to the procedure, a History and Physical                            was performed, and patient medications and                            allergies were reviewed. The patient's tolerance of                            previous anesthesia was also reviewed. The risks                            and benefits of the procedure and the sedation                            options and risks were discussed with the patient.                            All questions were answered, and informed consent                            was obtained. Prior Anticoagulants: The patient has                            taken no anticoagulant or antiplatelet agents. ASA                            Grade Assessment: III - A patient with severe                            systemic disease. After reviewing the risks and  benefits, the patient was deemed in satisfactory                            condition to undergo the procedure.                           - Sedation was administered by an anesthesia                            professional. Deep sedation was attained.                           After obtaining informed consent, the colonoscope                             was passed under direct vision. Throughout the                            procedure, the patient's blood pressure, pulse, and                            oxygen saturations were monitored continuously. The                            PCF-H190DL ([id number] Olympus pediatric colonscope                            was introduced through the anus and advanced to the                            the cecum, identified by appendiceal orifice and                            ileocecal valve. The colonoscopy was performed                            without difficulty. The patient tolerated the                            procedure well. The quality of the bowel                            preparation was evaluated using the BBPS [facility]                            Bowel Preparation Scale) with scores of: Right                            Colon = 3, Transverse Colon = 3 and Left Colon = 3                            (entire mucosa seen well with no residual staining,  small fragments of stool or opaque liquid). The                            total BBPS score equals 9. The ileocecal valve,                            appendiceal orifice, and rectum were photographed. Scope In: 10:17:33 AM Scope Out: 10:36:40 AM Scope Withdrawal Time: 0 hours 13 minutes 27 seconds  Total Procedure Duration: 0 hours 19 minutes 7 seconds  Findings:      Two sessile polyps were found in the descending colon and transverse       colon. The polyps were 2 to 3 mm in size. These polyps were removed with       a cold snare. Resection and retrieval were complete. Impression:               - Two 2 to 3 mm polyps in the descending colon and                            in the transverse colon, removed with a cold snare.                            Resected and retrieved. Moderate Sedation:      Not Applicable - Patient had care per Anesthesia. Recommendation:           - Patient has a contact number available  for                            emergencies. The signs and symptoms of potential                            delayed complications were discussed with the                            patient. Return to normal activities tomorrow.                            Written discharge instructions were provided to the                            patient.                           - Resume previous diet.                           - Continue present medications.                           - Await pathology results.                           - Repeat colonoscopy in 7 years for surveillance. Procedure Code(s):        --- Professional ---  84696, Colonoscopy, flexible; with removal of                            tumor(s), polyp(s), or other lesion(s) by snare                            technique Diagnosis Code(s):        --- Professional ---                           Z12.11, Encounter for screening for malignant                            neoplasm of colon                           D12.4, Benign neoplasm of descending colon                           D12.3, Benign neoplasm of transverse colon (hepatic                            flexure or splenic flexure) CPT copyright 2022 American [employer] Association. All rights reserved. The codes documented in this report are preliminary and upon coder review may  be revised to meet current compliance requirements. Jeani Hawking, MD Jeani Hawking, MD 11/29/2022 10:46:52 AM This report has been signed electronically. Number of Addenda: 0

## 2022-11-29 NOTE — Discharge Instructions (Signed)

## 2022-11-29 NOTE — H&P (Signed)
Nicole Khan HPI: This 57 year old white female presents to the office for colorectal cancer screening. She has 3-4 loose BM's per day with no obvious blood or mucus in the stool. She has had looser stools she has been on since she has been on multiple medications for her diabetes. She is gluten sensitive as well. She has a good appetite and has lost 30 pounds over the last 2 years; the weight loss has been intentional with dietary changes. She denies having any complaints of abdominal pain, nausea, vomiting, acid reflux, dysphagia or odynophagia. She denies having a family history of colon cancer, celiac sprue or IBD. Her last colonoscopy done on 06/14/2017 which revealed diverticulosis in the sigmoid colon, a hyperplastic polyp was removed from the descending colon, a tubular adenoma was removed from the transverse colon.  Past Medical History:  Diagnosis Date   Abnormal mammogram of left breast 09/26/2017   Abnormal Pap smear of cervix    Anxiety    Breast cancer (HCC)    Cancer (HCC)    Depression    Diabetes mellitus without complication (HCC)    type 2   Family history of breast cancer 04/11/2020   Family history of breast cancer 04/11/2020   Family history of prostate cancer 04/11/2020   Family history of prostate cancer 04/11/2020   Family history of thyroid cancer 04/11/2020   Family history of thyroid cancer 04/11/2020   Fracture dislocation of right ankle joint 06/04/2013   Fracture of distal fibula 06/04/2013   H/O bilateral salpingectomy 05/2015   History of kidney stones    passed stones, no surgery required   Hypercholesteremia    Hypertension    Personal history of radiation therapy    Polycystic ovary disease    PONV (postoperative nausea and vomiting)    Sleep apnea 05/2017   uses cpap   SVD (spontaneous vaginal delivery)    x 1    Past Surgical History:  Procedure Laterality Date   BREAST BIOPSY Right 02/29/2020   BREAST EXCISIONAL BIOPSY Left 2019    Benign   BREAST LUMPECTOMY     BREAST LUMPECTOMY WITH RADIOACTIVE SEED AND SENTINEL LYMPH NODE BIOPSY Right 04/20/2020   Procedure: RIGHT BREAST LUMPECTOMY WITH RADIOACTIVE SEED AND SENTINEL LYMPH NODE BIOPSY;  Surgeon: Griselda Miner, MD;  Location: [facility] OR;  Service: General;  Laterality: Right;   BREAST LUMPECTOMY WITH RADIOACTIVE SEED LOCALIZATION Left 09/26/2017   Procedure: LEFT BREAST LUMPECTOMY WITH RADIOACTIVE SEED LOCALIZATION ERAS PATHWAY;  Surgeon: Claud Kelp, MD;  Location:  SURGERY CENTER;  Service: General;  Laterality: Left;   BREAST LUMPECTOMY WITH RADIOACTIVE SEED LOCALIZATION Right 03/29/2021   Procedure: RIGHT BREAST LUMPECTOMY WITH RADIOACTIVE SEED LOCALIZATION;  Surgeon: Griselda Miner, MD;  Location: [facility] OR;  Service: General;  Laterality: Right;   CARPAL TUNNEL RELEASE     bilateral   CERVICAL CERCLAGE  07/1991; 05/1999   x 2   CESAREAN SECTION     x 1   CHOLECYSTECTOMY, LAPAROSCOPIC  1996   COLONOSCOPY WITH PROPOFOL N/A 06/24/2017   Procedure: COLONOSCOPY WITH PROPOFOL;  Surgeon: Charna Elizabeth, MD;  Location: [facility];  Service: Endoscopy;  Laterality: N/A;   CYSTOSCOPY N/A 06/07/2014   Procedure: CYSTOSCOPY;  Surgeon: Jerene Bears, MD;  Location: [facility];  Service: Gynecology;  Laterality: N/A;   LAPAROSCOPIC BILATERAL SALPINGO OOPHERECTOMY Right 06/07/2014   Procedure: LAPAROSCOPIC RIGHT SALPINGO OOPHORECTOMY/COLLECTION OF PELVIC WASHINGS;  Surgeon: Jerene Bears, MD;  Location: [facility];  Service: Gynecology;  Laterality: Right;   LAPAROSCOPIC UNILATERAL SALPINGECTOMY Left 06/07/2014   Procedure: LAPAROSCOPIC UNILATERAL SALPINGECTOMY;  Surgeon: Jerene Bears, MD;  Location: [facility];  Service: Gynecology;  Laterality: Left;   ORIF ANKLE FRACTURE Right 06/07/2013   Procedure: OPEN REDUCTION INTERNAL FIXATION (ORIF) ANKLE FRACTURE  FIBULA SYNDESMOSIS;  Surgeon: Loreta Ave, MD;  Location: [facility];  Service: Orthopedics;  Laterality:  Right;   RE-EXCISION OF BREAST CANCER,SUPERIOR MARGINS Right 06/01/2020   Procedure: RIGHT BREAST MARGIN REEXCISION;  Surgeon: Griselda Miner, MD;  Location: [facility] OR;  Service: General;  Laterality: Right;   TRIGGER FINGER RELEASE Right 01/30/2021    Family History  Problem Relation Age of Onset   Hypertension Mother    Infertility Mother    Deep vein thrombosis Mother    Anxiety disorder Mother    Depression Mother    Hepatitis C Mother        blood transfusion   Aneurysm Mother        Japanese   Cancer Father        Gallbladder; dx 50s   Hypertension Father    Anxiety disorder Father    Depression Father    Multiple births Sister    Thyroid disease Sister    Anxiety disorder Sister    Depression Sister    Breast cancer Sister 71   Thyroid cancer Sister 31       unknown type   Hypertension Brother    Anxiety disorder Brother    Depression Brother    Prostate cancer Paternal Uncle        dx 76s   Lung cancer Half-Sister 80       maternal half sister; no smoking hx    Social History:  reports that she has never smoked. She has never used smokeless tobacco. She reports that she does not drink alcohol and does not use drugs.  Allergies:  Allergies  Allergen Reactions   Other Anaphylaxis    Other reaction(s): Other (See Comments) Soy milk   Soy Allergy Anaphylaxis and Other (See Comments)    Soy milk Soy milk   Celery Oil Itching and Other (See Comments)    Causes itching in the mouth.   Corylus Swelling and Other (See Comments)    Hazelnuts, makes patients mouth swell Other reaction(s): Other (See Comments) Hazelnuts, makes patients mouth swell Hazelnuts, makes patients mouth swell   Egg-Derived Products Nausea Only   Tape Hives    Skin loss with the use of steri strips    Medications: Scheduled: Continuous:  sodium chloride      No results found for this or any previous visit (from the past 24 hour(s)).   No results found.  ROS:  As stated above in  the HPI otherwise negative.  Blood pressure 110/61, pulse 62, temperature (!) 97.3 F (36.3 C), temperature source Temporal, resp. rate 19, height 5\' 1"  (1.549 m), weight 79.8 kg, last menstrual period 12/31/2015, SpO2 100%.    PE: Gen: NAD, Alert and Oriented HEENT:  [place]/AT, EOMI Neck: Supple, no LAD Lungs: CTA Bilaterally CV: RRR without M/G/R ABD: Soft, NTND, +BS Ext: No C/C/E  Assessment/Plan: 1) Screening colonoscopy.  Mikko Lewellen D 11/29/2022, 9:14 AM

## 2022-11-30 ENCOUNTER — Other Ambulatory Visit (HOSPITAL_COMMUNITY): Payer: Self-pay

## 2022-12-02 ENCOUNTER — Encounter (HOSPITAL_COMMUNITY): Payer: Self-pay | Admitting: Gastroenterology

## 2022-12-05 LAB — SURGICAL PATHOLOGY

## 2022-12-20 ENCOUNTER — Other Ambulatory Visit (HOSPITAL_COMMUNITY): Payer: Self-pay

## 2023-01-06 ENCOUNTER — Other Ambulatory Visit (HOSPITAL_COMMUNITY): Payer: Self-pay

## 2023-01-14 ENCOUNTER — Other Ambulatory Visit (HOSPITAL_COMMUNITY): Payer: Self-pay

## 2023-02-10 ENCOUNTER — Ambulatory Visit: Payer: Managed Care, Other (non HMO) | Attending: Hematology and Oncology

## 2023-02-10 VITALS — Wt 182.4 lb

## 2023-02-10 DIAGNOSIS — Z483 Aftercare following surgery for neoplasm: Secondary | ICD-10-CM | POA: Insufficient documentation

## 2023-02-10 NOTE — Therapy (Signed)
OUTPATIENT PHYSICAL THERAPY SOZO SCREENING NOTE   Patient Name: Nicole Khan MRN: [id number] DOB:[birth year], 57 y.o., female Today's Date: 02/10/2023  PCP: Tollie Eth, NP REFERRING PROVIDER: Serena Croissant, MD   PT End of Session - 02/10/23 1104     Visit Number 4   # unchanged due to screen only   PT Start Time 1102    PT Stop Time 1106    PT Time Calculation (min) 4 min    Activity Tolerance Patient tolerated treatment well    Behavior During Therapy [facility] for tasks assessed/performed             Past Medical History:  Diagnosis Date   Abnormal mammogram of left breast 09/26/2017   Abnormal Pap smear of cervix    Anxiety    Breast cancer (HCC)    Cancer (HCC)    Depression    Diabetes mellitus without complication (HCC)    type 2   Family history of breast cancer 04/11/2020   Family history of breast cancer 04/11/2020   Family history of prostate cancer 04/11/2020   Family history of prostate cancer 04/11/2020   Family history of thyroid cancer 04/11/2020   Family history of thyroid cancer 04/11/2020   Fracture dislocation of right ankle joint 06/04/2013   Fracture of distal fibula 06/04/2013   H/O bilateral salpingectomy 05/2015   History of kidney stones    passed stones, no surgery required   Hypercholesteremia    Hypertension    Personal history of radiation therapy    Polycystic ovary disease    PONV (postoperative nausea and vomiting)    Sleep apnea 05/2017   uses cpap   SVD (spontaneous vaginal delivery)    x 1   Past Surgical History:  Procedure Laterality Date   BREAST BIOPSY Right 02/29/2020   BREAST EXCISIONAL BIOPSY Left 2019   Benign   BREAST LUMPECTOMY     BREAST LUMPECTOMY WITH RADIOACTIVE SEED AND SENTINEL LYMPH NODE BIOPSY Right 04/20/2020   Procedure: RIGHT BREAST LUMPECTOMY WITH RADIOACTIVE SEED AND SENTINEL LYMPH NODE BIOPSY;  Surgeon: Griselda Miner, MD;  Location: [facility] OR;  Service: General;  Laterality: Right;   BREAST  LUMPECTOMY WITH RADIOACTIVE SEED LOCALIZATION Left 09/26/2017   Procedure: LEFT BREAST LUMPECTOMY WITH RADIOACTIVE SEED LOCALIZATION ERAS PATHWAY;  Surgeon: Claud Kelp, MD;  Location: [facility];  Service: General;  Laterality: Left;   BREAST LUMPECTOMY WITH RADIOACTIVE SEED LOCALIZATION Right 03/29/2021   Procedure: RIGHT BREAST LUMPECTOMY WITH RADIOACTIVE SEED LOCALIZATION;  Surgeon: Griselda Miner, MD;  Location: [facility] OR;  Service: General;  Laterality: Right;   CARPAL TUNNEL RELEASE     bilateral   CERVICAL CERCLAGE  07/1991; 05/1999   x 2   CESAREAN SECTION     x 1   CHOLECYSTECTOMY, LAPAROSCOPIC  1996   COLONOSCOPY WITH PROPOFOL N/A 06/24/2017   Procedure: COLONOSCOPY WITH PROPOFOL;  Surgeon: Charna Elizabeth, MD;  Location: [facility];  Service: Endoscopy;  Laterality: N/A;   COLONOSCOPY WITH PROPOFOL N/A 11/29/2022   Procedure: COLONOSCOPY WITH PROPOFOL;  Surgeon: Jeani Hawking, MD;  Location: [facility];  Service: Gastroenterology;  Laterality: N/A;   CYSTOSCOPY N/A 06/07/2014   Procedure: CYSTOSCOPY;  Surgeon: Jerene Bears, MD;  Location: [facility];  Service: Gynecology;  Laterality: N/A;   LAPAROSCOPIC BILATERAL SALPINGO OOPHERECTOMY Right 06/07/2014   Procedure: LAPAROSCOPIC RIGHT SALPINGO OOPHORECTOMY/COLLECTION OF PELVIC WASHINGS;  Surgeon: Jerene Bears, MD;  Location: [facility];  Service: Gynecology;  Laterality: Right;  LAPAROSCOPIC UNILATERAL SALPINGECTOMY Left 06/07/2014   Procedure: LAPAROSCOPIC UNILATERAL SALPINGECTOMY;  Surgeon: Jerene Bears, MD;  Location: [facility];  Service: Gynecology;  Laterality: Left;   ORIF ANKLE FRACTURE Right 06/07/2013   Procedure: OPEN REDUCTION INTERNAL FIXATION (ORIF) ANKLE FRACTURE  FIBULA SYNDESMOSIS;  Surgeon: Loreta Ave, MD;  Location: [facility];  Service: Orthopedics;  Laterality: Right;   POLYPECTOMY  11/29/2022   Procedure: POLYPECTOMY;  Surgeon: Jeani Hawking, MD;  Location: [facility];  Service:  Gastroenterology;;   RE-EXCISION OF BREAST CANCER,SUPERIOR MARGINS Right 06/01/2020   Procedure: RIGHT BREAST MARGIN REEXCISION;  Surgeon: Griselda Miner, MD;  Location: [facility] OR;  Service: General;  Laterality: Right;   TRIGGER FINGER RELEASE Right 01/30/2021   Patient Active Problem List   Diagnosis Date Noted   Type 2 diabetes mellitus with hyperglycemia, without long-term current use of insulin (HCC) 08/09/2022   Hyperlipidemia associated with type 2 diabetes mellitus (HCC) 08/09/2022   Allergic rhinitis 11/08/2020   Exercise induced bronchospasm 11/08/2020   Encounter for annual physical exam 04/17/2020   Malignant neoplasm of upper-outer quadrant of right breast in female, estrogen receptor positive (HCC) 03/21/2020   Abnormal mammogram of left breast 09/26/2017   Bilateral ovarian cysts 05/23/2014   Fracture of distal fibula 06/04/2013   Hypertension associated with diabetes (HCC) 04/07/2013   PCOS (polycystic ovarian syndrome) 04/07/2013   Renal calculi 04/07/2013   Adjustment disorder with mixed anxiety and depressed mood 04/07/2013    REFERRING DIAG: right breast cancer at risk for lymphedema  THERAPY DIAG: Aftercare following surgery for neoplasm  PERTINENT HISTORY: right breast cancer diagnosed with bio Pt had reexcision on June 01, 2020.  biopsy on 02/29/20 showed invasive ductal carcinoma with DCIS, grade 1, HER-2 negative (1+), ER/PR >95%, Ki 67 5%. Surgery is scheduled for Jan 20 and she will have radiation.  Past history includes, DM, sleep apnea, bilaterl carpal tunnel surgery( 2006) and right trigger finger surgery ( 2011)   PRECAUTIONS: right UE Lymphedema risk, None  SUBJECTIVE: Pt returns for her 6 month L-Dex screen.   PAIN:  Are you having pain? No  SOZO SCREENING: Patient was assessed today using the SOZO machine to determine the lymphedema index score. This was compared to her baseline score. It was determined that she is within the recommended range when  compared to her baseline and no further action is needed at this time. She will continue SOZO screenings. These are done every 3 months for 2 years post operatively followed by every 6 months for 2 years, and then annually.   L-DEX FLOWSHEETS - 02/10/23 1100       L-DEX LYMPHEDEMA SCREENING   Measurement Type Unilateral    L-DEX MEASUREMENT EXTREMITY Upper Extremity    POSITION  Standing    DOMINANT SIDE Right    At Risk Side Right    BASELINE SCORE (UNILATERAL) 1.8    L-DEX SCORE (UNILATERAL) -4    VALUE CHANGE (UNILAT) -5.8              Hermenia Bers, PTA 02/10/2023, 11:05 AM

## 2023-02-11 ENCOUNTER — Ambulatory Visit: Payer: Managed Care, Other (non HMO) | Admitting: Nurse Practitioner

## 2023-02-11 ENCOUNTER — Encounter: Payer: Self-pay | Admitting: Nurse Practitioner

## 2023-02-11 ENCOUNTER — Other Ambulatory Visit (HOSPITAL_COMMUNITY): Payer: Self-pay

## 2023-02-11 VITALS — BP 124/82 | HR 88 | Wt 180.8 lb

## 2023-02-11 DIAGNOSIS — Z23 Encounter for immunization: Secondary | ICD-10-CM

## 2023-02-11 DIAGNOSIS — L03032 Cellulitis of left toe: Secondary | ICD-10-CM | POA: Diagnosis not present

## 2023-02-11 MED ORDER — FLUCONAZOLE 150 MG PO TABS
150.0000 mg | ORAL_TABLET | Freq: Once | ORAL | 2 refills | Status: AC
Start: 2023-02-11 — End: 2023-02-13
  Filled 2023-02-11: qty 2, 2d supply, fill #0

## 2023-02-11 MED ORDER — SULFAMETHOXAZOLE-TRIMETHOPRIM 800-160 MG PO TABS
1.0000 | ORAL_TABLET | Freq: Two times a day (BID) | ORAL | 0 refills | Status: DC
Start: 2023-02-11 — End: 2023-03-20
  Filled 2023-02-11: qty 14, 7d supply, fill #0

## 2023-02-11 MED ORDER — MUPIROCIN 2 % EX OINT
TOPICAL_OINTMENT | CUTANEOUS | 2 refills | Status: DC
Start: 2023-02-11 — End: 2023-03-20
  Filled 2023-02-11: qty 22, 10d supply, fill #0

## 2023-02-11 NOTE — Progress Notes (Signed)
Tollie Eth, DNP, AGNP-c [facility] [address] [place], [place] [address] [phone]   ACUTE VISIT- ESTABLISHED PATIENT  Blood pressure 124/82, pulse 88, weight 180 lb 12.8 oz (82 kg), last menstrual period 12/31/2015.  Subjective:  HPI Nicole Khan is a 57 y.o. female presents to day for evaluation of acute concern(s).   Nicole Khan presents with a chief complaint of a painful toe following a minor trauma. She reports falling off a step stool approximately one week ago, during which she believes one of the stool legs struck her toe, resulting in a blood blister. The blister ruptured the following day, and the patient has been cleaning and dressing the wound regularly since then. She noticed an improvement initially but then experienced a resurgence of pain a few days later, which has since become severe enough to affect her ability to walk.  The patient has been managing the wound at home, cleaning it and applying a new bandage daily. Despite these efforts, the patient reports that the toe has become increasingly painful, with a throbbing sensation and noticeable heat emanating from the area. The patient denies any known allergies to medications.  The patient has a history of yeast infections and is aware of the potential for such an infection to occur as a side effect of antibiotic treatment. She has previously used mupirocin ointment and is familiar with its application. The patient is due for a follow-up visit in a month's time.  ROS negative except for what is listed in HPI. History, Medications, Surgery, SDOH, and Family History reviewed and updated as appropriate.  Objective:  Physical Exam Musculoskeletal:        General: Swelling and tenderness present. Normal range of motion.  Skin:    General: Skin is warm.     Capillary Refill: Capillary refill takes less than 2 seconds.     Findings: Erythema and rash present. Rash is pustular.     Comments: Appx  1cm blister present on the left great toe with purulent drainage present. Erythema, warmth, tenderness, and edema present to the surrounding tissue.   Neurological:     Motor: No weakness.     Gait: Gait abnormal.  Psychiatric:        Mood and Affect: Mood normal.        Behavior: Behavior normal.          Assessment & Plan:   Problem List Items Addressed This Visit     Cellulitis of toe of left foot - Primary    Presents with an infected blood blister on the left toe following trauma from a fall one week ago. The blister ruptured the next day, initially improved with cleaning, but subsequently became painful and difficult to walk on. Examination reveals signs of infection and cellulitis. Minor incision performed to drain the blister and relieve pressure, followed by Silvadene application for its antibacterial properties. Informed consent obtained, discussing risks (pain, potential re-infection), benefits (pressure relief, infection control), and alternatives (conservative management). Advised on keeping the area clean, monitoring for re-infection, and using ice and elevation to reduce pain and swelling. No antibiotic allergies reported. Prescribed Bactrim (oral antibiotic), mupirocin ointment (topical), and Diflucan (potential yeast infection). - Perform minor incision to drain the blister - Apply Silvadene ointment - Prescribe Bactrim (oral antibiotic) - Prescribe mupirocin ointment - Provide Diflucan for potential yeast infection - Advise on the use of ice and elevation to reduce pain and swelling - Instruct to keep the area wrapped for the next  few days - Educate on how to re-drain the blister if it refills - Follow up in one month or sooner if symptoms worsen.      Relevant Medications   sulfamethoxazole-trimethoprim (BACTRIM DS) 800-160 MG tablet   mupirocin ointment (BACTROBAN) 2 %   Other Visit Diagnoses     Need for influenza vaccination       Relevant Orders   Flu  vaccine trivalent PF, 6mos and older(Flulaval,Afluria,Fluarix,Fluzone) (Completed)   Need for COVID-19 vaccine       Relevant Orders   Pfizer Comirnaty Covid -19 Vaccine 58yrs and older (Completed)         Tollie Eth, DNP, AGNP-c

## 2023-02-18 DIAGNOSIS — L03032 Cellulitis of left toe: Secondary | ICD-10-CM | POA: Insufficient documentation

## 2023-02-18 NOTE — Assessment & Plan Note (Signed)
Presents with an infected blood blister on the left toe following trauma from a fall one week ago. The blister ruptured the next day, initially improved with cleaning, but subsequently became painful and difficult to walk on. Examination reveals signs of infection and cellulitis. Minor incision performed to drain the blister and relieve pressure, followed by Silvadene application for its antibacterial properties. Informed consent obtained, discussing risks (pain, potential re-infection), benefits (pressure relief, infection control), and alternatives (conservative management). Advised on keeping the area clean, monitoring for re-infection, and using ice and elevation to reduce pain and swelling. No antibiotic allergies reported. Prescribed Bactrim (oral antibiotic), mupirocin ointment (topical), and Diflucan (potential yeast infection). - Perform minor incision to drain the blister - Apply Silvadene ointment - Prescribe Bactrim (oral antibiotic) - Prescribe mupirocin ointment - Provide Diflucan for potential yeast infection - Advise on the use of ice and elevation to reduce pain and swelling - Instruct to keep the area wrapped for the next few days - Educate on how to re-drain the blister if it refills - Follow up in one month or sooner if symptoms worsen.

## 2023-03-03 ENCOUNTER — Other Ambulatory Visit (HOSPITAL_COMMUNITY): Payer: Self-pay

## 2023-03-10 ENCOUNTER — Other Ambulatory Visit (HOSPITAL_COMMUNITY): Payer: Self-pay

## 2023-03-20 ENCOUNTER — Other Ambulatory Visit (HOSPITAL_COMMUNITY): Payer: Self-pay

## 2023-03-20 ENCOUNTER — Ambulatory Visit: Payer: Managed Care, Other (non HMO) | Admitting: Nurse Practitioner

## 2023-03-20 ENCOUNTER — Other Ambulatory Visit: Payer: Self-pay

## 2023-03-20 ENCOUNTER — Encounter: Payer: Self-pay | Admitting: Nurse Practitioner

## 2023-03-20 VITALS — BP 120/72 | HR 70 | Ht 62.5 in | Wt 180.2 lb

## 2023-03-20 DIAGNOSIS — R61 Generalized hyperhidrosis: Secondary | ICD-10-CM

## 2023-03-20 DIAGNOSIS — E785 Hyperlipidemia, unspecified: Secondary | ICD-10-CM

## 2023-03-20 DIAGNOSIS — F4323 Adjustment disorder with mixed anxiety and depressed mood: Secondary | ICD-10-CM

## 2023-03-20 DIAGNOSIS — E1159 Type 2 diabetes mellitus with other circulatory complications: Secondary | ICD-10-CM | POA: Diagnosis not present

## 2023-03-20 DIAGNOSIS — E282 Polycystic ovarian syndrome: Secondary | ICD-10-CM

## 2023-03-20 DIAGNOSIS — I152 Hypertension secondary to endocrine disorders: Secondary | ICD-10-CM

## 2023-03-20 DIAGNOSIS — E1169 Type 2 diabetes mellitus with other specified complication: Secondary | ICD-10-CM

## 2023-03-20 DIAGNOSIS — E1165 Type 2 diabetes mellitus with hyperglycemia: Secondary | ICD-10-CM

## 2023-03-20 DIAGNOSIS — I10 Essential (primary) hypertension: Secondary | ICD-10-CM

## 2023-03-20 DIAGNOSIS — G5620 Lesion of ulnar nerve, unspecified upper limb: Secondary | ICD-10-CM

## 2023-03-20 MED ORDER — BUPROPION HCL ER (XL) 150 MG PO TB24
150.0000 mg | ORAL_TABLET | Freq: Every day | ORAL | 3 refills | Status: DC
  Filled 2023-03-20 – 2023-06-23 (×2): qty 90, 90d supply, fill #0
  Filled 2023-09-28: qty 90, 90d supply, fill #1
  Filled 2023-12-29: qty 90, 90d supply, fill #2

## 2023-03-20 MED ORDER — LEVOCETIRIZINE DIHYDROCHLORIDE 5 MG PO TABS: 5.0000 mg | ORAL_TABLET | Freq: Every evening | ORAL | Status: DC

## 2023-03-20 MED ORDER — SEMAGLUTIDE (1 MG/DOSE) 4 MG/3ML ~~LOC~~ SOPN
1.0000 mg | PEN_INJECTOR | SUBCUTANEOUS | 1 refills | Status: DC
Start: 2023-03-20 — End: 2023-09-18
  Filled 2023-03-20: qty 9, 84d supply, fill #0
  Filled 2023-06-23: qty 9, 84d supply, fill #1

## 2023-03-20 MED ORDER — SPIRONOLACTONE 100 MG PO TABS
100.0000 mg | ORAL_TABLET | Freq: Two times a day (BID) | ORAL | 3 refills | Status: DC
  Filled 2023-03-20 – 2023-06-23 (×2): qty 180, 90d supply, fill #0
  Filled 2023-09-28: qty 180, 90d supply, fill #1
  Filled 2023-12-29: qty 180, 90d supply, fill #2

## 2023-03-20 MED ORDER — ATORVASTATIN CALCIUM 40 MG PO TABS
40.0000 mg | ORAL_TABLET | Freq: Every evening | ORAL | 3 refills | Status: DC
  Filled 2023-03-20 – 2023-06-23 (×2): qty 90, 90d supply, fill #0
  Filled 2023-09-28: qty 90, 90d supply, fill #1
  Filled 2023-12-29: qty 90, 90d supply, fill #2

## 2023-03-20 MED ORDER — METOPROLOL TARTRATE 100 MG PO TABS
100.0000 mg | ORAL_TABLET | Freq: Two times a day (BID) | ORAL | 3 refills | Status: DC
  Filled 2023-03-20 – 2023-05-06 (×2): qty 180, 90d supply, fill #0
  Filled 2023-08-04: qty 180, 90d supply, fill #1
  Filled 2023-11-19: qty 180, 90d supply, fill #2
  Filled 2024-02-16: qty 180, 90d supply, fill #3

## 2023-03-20 MED ORDER — EMPAGLIFLOZIN 10 MG PO TABS
10.0000 mg | ORAL_TABLET | Freq: Every day | ORAL | 3 refills | Status: DC
  Filled 2023-03-20 – 2023-05-06 (×2): qty 90, 90d supply, fill #0
  Filled 2023-08-04: qty 90, 90d supply, fill #1
  Filled 2023-11-19: qty 90, 90d supply, fill #2

## 2023-03-20 MED ORDER — CITALOPRAM HYDROBROMIDE 20 MG PO TABS
20.0000 mg | ORAL_TABLET | Freq: Every day | ORAL | 3 refills | Status: DC
  Filled 2023-03-20 – 2023-05-06 (×2): qty 90, 90d supply, fill #0
  Filled 2023-08-04: qty 90, 90d supply, fill #1
  Filled 2023-11-19: qty 90, 90d supply, fill #2
  Filled 2024-02-16: qty 90, 90d supply, fill #3

## 2023-03-20 MED ORDER — BUPROPION HCL ER (XL) 300 MG PO TB24
300.0000 mg | ORAL_TABLET | Freq: Every day | ORAL | 3 refills | Status: DC
  Filled 2023-03-20 – 2023-06-23 (×2): qty 90, 90d supply, fill #0
  Filled 2023-09-28: qty 90, 90d supply, fill #1
  Filled 2023-12-29: qty 90, 90d supply, fill #2

## 2023-03-20 NOTE — Progress Notes (Unsigned)
  Shawna Clamp, DNP, AGNP-c [facility]  [address] [place], [place] [address] [phone]  ESTABLISHED PATIENT- Chronic Health and/or Follow-Up Visit  Blood pressure 120/72, pulse 70, height 5' 2.5" (1.588 m), weight 180 lb 3.2 oz (81.7 kg), last menstrual period 12/31/2015, SpO2 97%.    Nicole Khan is a 57 y.o. year old female presenting today for evaluation and management of chronic conditions.     All ROS negative with exception of what is listed above.   PHYSICAL EXAM Physical Exam   PLAN Problem List Items Addressed This Visit   None   No follow-ups on file.  Shawna Clamp, DNP, AGNP-c

## 2023-03-20 NOTE — Patient Instructions (Addendum)
If you would like to try this I will be happy to send it in for you for the hot flashes.  Clonidine Patches What is this medication? CLONIDINE (KLOE ni deen) treats high blood pressure. It works by relaxing blood vessels, which decreases blood pressure and the amount of work the heart has to do. This medicine may be used for other purposes; ask your health care provider or pharmacist if you have questions. COMMON BRAND NAME(S): Catapres TTS-2, Catapres-TTS What should I tell my care team before I take this medication? They need to know if you have any of these conditions: Kidney disease An unusual or allergic reaction to clonidine, other medications, foods, dyes, or preservatives Pregnant or trying to get pregnant Breast-feeding How should I use this medication? This medication is for external use only. Use it as directed on the prescription label. Apply the patch to an area of the upper arm or part of the body that is clean, dry, and hairless. Avoid injured, irritated, calloused, or scarred areas. Use a different site each time to prevent skin irritation. Do not cut or trim the patch. One patch should last for 7 days. Keep using it unless your care team tells you to stop. This medication comes with INSTRUCTIONS FOR USE. Ask your pharmacist for directions on how to use this medication. Read the information carefully. Talk to your pharmacist or care team if you have questions. Talk to your care team about the use of this medication in children. Special care may be needed. Overdosage: If you think you have taken too much of this medicine contact a poison control center or emergency room at once. NOTE: This medicine is only for you. Do not share this medicine with others. What if I miss a dose? Replace each patch on the same day of each week, or if the patch falls off. If you do forget to change the patch for two or three days, check with your care team. What may interact with this medication? Do  not take this medication with any of the following: MAOIs, such as Carbex, Eldepryl, Marplan, Nardil, and Parnate This medication may also interact with the following: Barbiturate medications for inducing sleep or treating seizures, such as phenobarbital Certain medications for blood pressure, heart disease, or irregular heart beat Certain medications for mental health conditions Prescription pain medications This list may not describe all possible interactions. Give your health care provider a list of all the medicines, herbs, non-prescription drugs, or dietary supplements you use. Also tell them if you smoke, drink alcohol, or use illegal drugs. Some items may interact with your medicine. What should I watch for while using this medication? Visit your care team for regular checks on your progress. Check your heart rate and blood pressure as directed. Know what your heart rate and blood pressure should be, and when to contact your care team. You can shower or bathe with the skin patch in position. If the patch gets loose, cover it with the extra adhesive overlay provided. This medication may affect your coordination, reaction time, or judgment. Do not drive or operate machinery until you know how this medication affects you. Sit up or stand slowly to reduce the risk of dizzy or fainting spells. Drinking alcohol with this medication can increase the risk of these side effects. Your mouth may get dry. Chewing sugarless gum or sucking hard candy and drinking plenty of water may help. Contact your care team if the problem doesn't go away or is severe.  Do not treat yourself for coughs, colds, or pain while you are taking this medication without asking your care team for advice. Some medications may increase your blood pressure. If you are going to have surgery, tell your care team that you are using this medication. If you are going to have a magnetic resonance imaging (MRI) procedure, tell your MRI  technician if you have this patch on your body. It must be removed before a MRI. What side effects may I notice from receiving this medication? Side effects that you should report to your care team as soon as possible: Allergic reactions--skin rash, itching, hives, swelling of the face, lips, tongue, or throat Low blood pressure--dizziness, feeling faint or lightheaded, blurry vision Slow heartbeat--dizziness, feeling faint or lightheaded, confusion, trouble breathing, unusual weakness or fatigue Side effects that usually do not require medical attention (report to your care team if they continue or are bothersome): Constipation Dizziness Drowsiness Dry eyes Dry mouth Fatigue This list may not describe all possible side effects. Call your doctor for medical advice about side effects. You may report side effects to FDA at 1-800-FDA-1088. Where should I keep my medication? Keep out of the reach of children and pets. Store at room temperature between 20 and 25 degrees C (68 to 77 degrees F). Throw away any unused medication after the expiration date. NOTE: This sheet is a summary. It may not cover all possible information. If you have questions about this medicine, talk to your doctor, pharmacist, or health care provider.  2024 Elsevier/Gold Standard (2021-10-17 00:00:00)

## 2023-03-21 LAB — COMPREHENSIVE METABOLIC PANEL
ALT: 24 [IU]/L (ref 0–32)
AST: 23 [IU]/L (ref 0–40)
Albumin: 4.6 g/dL (ref 3.8–4.9)
Alkaline Phosphatase: 102 [IU]/L (ref 44–121)
BUN/Creatinine Ratio: 13 (ref 9–23)
BUN: 15 mg/dL (ref 6–24)
Bilirubin Total: 0.8 mg/dL (ref 0.0–1.2)
CO2: 18 mmol/L — ABNORMAL LOW (ref 20–29)
Calcium: 10.5 mg/dL — ABNORMAL HIGH (ref 8.7–10.2)
Chloride: 102 mmol/L (ref 96–106)
Creatinine, Ser: 1.17 mg/dL — ABNORMAL HIGH (ref 0.57–1.00)
Globulin, Total: 2.6 g/dL (ref 1.5–4.5)
Glucose: 98 mg/dL (ref 70–99)
Potassium: 4.7 mmol/L (ref 3.5–5.2)
Sodium: 141 mmol/L (ref 134–144)
Total Protein: 7.2 g/dL (ref 6.0–8.5)
eGFR: 55 mL/min/{1.73_m2} — ABNORMAL LOW (ref >59)

## 2023-03-21 LAB — PARATHYROID HORMONE, INTACT (NO CA): PTH: 15 pg/mL (ref 15–65)

## 2023-03-21 LAB — CBC WITH DIFFERENTIAL/PLATELET
Basophils Absolute: 0.1 10*3/uL (ref 0.0–0.2)
Basos: 1 %
EOS (ABSOLUTE): 0.1 10*3/uL (ref 0.0–0.4)
Eos: 1 %
Hematocrit: 52 % — ABNORMAL HIGH (ref 34.0–46.6)
Hemoglobin: 17.4 g/dL — ABNORMAL HIGH (ref 11.1–15.9)
Immature Grans (Abs): 0 10*3/uL (ref 0.0–0.1)
Immature Granulocytes: 0 %
Lymphocytes Absolute: 2 10*3/uL (ref 0.7–3.1)
Lymphs: 25 %
MCH: 32.6 pg (ref 26.6–33.0)
MCHC: 33.5 g/dL (ref 31.5–35.7)
MCV: 98 fL — ABNORMAL HIGH (ref 79–97)
Monocytes Absolute: 0.5 10*3/uL (ref 0.1–0.9)
Monocytes: 6 %
Neutrophils Absolute: 5.3 10*3/uL (ref 1.4–7.0)
Neutrophils: 67 %
Platelets: 326 10*3/uL (ref 150–450)
RBC: 5.33 x10E6/uL — ABNORMAL HIGH (ref 3.77–5.28)
RDW: 12.4 % (ref 11.7–15.4)
WBC: 7.9 10*3/uL (ref 3.4–10.8)

## 2023-03-21 LAB — TSH: TSH: 1.64 u[IU]/mL (ref 0.450–4.500)

## 2023-03-21 LAB — HEMOGLOBIN A1C
Est. average glucose Bld gHb Est-mCnc: 131 mg/dL
Hgb A1c MFr Bld: 6.2 % — ABNORMAL HIGH (ref 4.8–5.6)

## 2023-03-21 LAB — MICROALBUMIN / CREATININE URINE RATIO
Creatinine, Urine: 129.2 mg/dL
Microalb/Creat Ratio: 3 mg/g{creat} (ref 0–29)
Microalbumin, Urine: 4.4 ug/mL

## 2023-03-27 ENCOUNTER — Other Ambulatory Visit: Payer: Self-pay

## 2023-03-27 ENCOUNTER — Other Ambulatory Visit (HOSPITAL_COMMUNITY): Payer: Self-pay

## 2023-03-27 MED ORDER — GABAPENTIN 300 MG PO CAPS
300.0000 mg | ORAL_CAPSULE | Freq: Two times a day (BID) | ORAL | 1 refills | Status: DC | PRN
  Filled 2023-03-27 (×2): qty 60, 30d supply, fill #0
  Filled 2023-06-23: qty 60, 30d supply, fill #1

## 2023-03-27 MED ORDER — ASPIRIN 81 MG PO CHEW
81.0000 mg | CHEWABLE_TABLET | Freq: Two times a day (BID) | ORAL | 0 refills | Status: DC
  Filled 2023-03-27 (×2): qty 60, 30d supply, fill #0

## 2023-03-27 MED ORDER — MELOXICAM 15 MG PO TABS
15.0000 mg | ORAL_TABLET | Freq: Every day | ORAL | 2 refills | Status: DC | PRN
  Filled 2023-03-27 (×2): qty 30, 30d supply, fill #0

## 2023-03-27 MED ORDER — ONDANSETRON 4 MG PO TBDP
4.0000 mg | ORAL_TABLET | Freq: Three times a day (TID) | ORAL | 0 refills | Status: DC | PRN
  Filled 2023-03-27 (×2): qty 9, 3d supply, fill #0

## 2023-03-27 MED ORDER — HYDROCODONE-ACETAMINOPHEN 10-325 MG PO TABS
1.0000 | ORAL_TABLET | Freq: Four times a day (QID) | ORAL | 0 refills | Status: DC | PRN
  Filled 2023-03-27: qty 28, 7d supply, fill #0

## 2023-03-28 NOTE — Progress Notes (Signed)
 56 y.o. [id number] Married Caucasian female here for annual exam.    Having night sweats.   Started gabapentin  recently following surgery on her right arm.  She was dx with restless leg syndrome.   Dealing with elevated calcium .  Normal PTH.   Enjoying her grandson who lives in [place].  PCP: Oris Camie BRAVO, NP   Patient's last menstrual period was 12/31/2015 (within weeks).           Sexually active: Yes.    The current method of family planning is post menopausal status.    Menopausal hormone therapy:  n/a Exercising: No.   Smoker:  no  OB History  Gravida Para Term Preterm AB Living  4 2 1 1 2 2   SAB IAB Ectopic Multiple Live Births  2 0 0 0 2    # Outcome Date GA Lbr Len/2nd Weight Sex Type Anes PTL Lv  4 SAB 2005          3 Term 10/1999 [redacted]w[redacted]d  8 lb 1 oz (3.657 kg) F CS-Unspec   LIV  2 Preterm 03/1988 [redacted]w[redacted]d  5 lb (2.268 kg) F Vag-Spont   LIV  1 SAB 08/1982             HEALTH MAINTENANCE: Last 2 paps:  09/24/21 ASCUS: HR HPV neg, 09/21/20 neg: HR HPV neg History of abnormal Pap or positive HPV:  yes, 06-08-19 LGSIL:Neg HR HPV  Mammogram:   05/14/22 Breast Density Cat B, BI-RADS CAT 2 benign Colonoscopy:  11/29/22 - polyps - due in 2029 Bone Density:  10/26/21  Result  osteopenia   Immunization History  Administered Date(s) Administered   Influenza, High Dose Seasonal PF 01/06/2017, 01/05/2019   Influenza, Seasonal, Injecte, Preservative Fre 02/11/2023   Influenza,inj,Quad PF,6+ Mos 11/19/2012, 12/29/2015, 12/30/2016, 12/31/2017, 04/13/2021, 03/29/2022   Influenza-Unspecified 11/19/2012, 12/31/2015   PFIZER(Purple Top)SARS-COV-2 Vaccination 06/28/2019, 07/23/2019, 12/13/2019, 04/13/2020   PNEUMOCOCCAL CONJUGATE-20 04/13/2021   Pfizer(Comirnaty )Fall Seasonal Vaccine 12 years and older 03/29/2022, 02/11/2023   Tdap 05/14/2013      reports that she has never smoked. She has never used smokeless tobacco. She reports that she does not drink alcohol and does not use  drugs.  Past Medical History:  Diagnosis Date   Abnormal mammogram of left breast 09/26/2017   Abnormal Pap smear of cervix    Anxiety    Breast cancer (HCC)    Cancer (HCC)    Depression    Diabetes mellitus without complication (HCC)    type 2   Family history of breast cancer 04/11/2020   Family history of breast cancer 04/11/2020   Family history of prostate cancer 04/11/2020   Family history of prostate cancer 04/11/2020   Family history of thyroid  cancer 04/11/2020   Family history of thyroid  cancer 04/11/2020   Fracture dislocation of right ankle joint 06/04/2013   Fracture of distal fibula 06/04/2013   Fracture of distal fibula 06/04/2013   H/O bilateral salpingectomy 05/2015   History of kidney stones    passed stones, no surgery required   Hypercholesteremia    Hypertension    Personal history of radiation therapy    Polycystic ovary disease    PONV (postoperative nausea and vomiting)    Sleep apnea 05/2017   uses cpap   SVD (spontaneous vaginal delivery)    x 1    Past Surgical History:  Procedure Laterality Date   BREAST BIOPSY Right 02/29/2020   BREAST EXCISIONAL BIOPSY Left 2019   Benign   BREAST  LUMPECTOMY     BREAST LUMPECTOMY WITH RADIOACTIVE SEED AND SENTINEL LYMPH NODE BIOPSY Right 04/20/2020   Procedure: RIGHT BREAST LUMPECTOMY WITH RADIOACTIVE SEED AND SENTINEL LYMPH NODE BIOPSY;  Surgeon: Curvin Deward MOULD, MD;  Location: [facility] OR;  Service: General;  Laterality: Right;   BREAST LUMPECTOMY WITH RADIOACTIVE SEED LOCALIZATION Left 09/26/2017   Procedure: LEFT BREAST LUMPECTOMY WITH RADIOACTIVE SEED LOCALIZATION ERAS PATHWAY;  Surgeon: Gail Favorite, MD;  Location: [facility];  Service: General;  Laterality: Left;   BREAST LUMPECTOMY WITH RADIOACTIVE SEED LOCALIZATION Right 03/29/2021   Procedure: RIGHT BREAST LUMPECTOMY WITH RADIOACTIVE SEED LOCALIZATION;  Surgeon: Curvin Deward MOULD, MD;  Location: [facility] OR;  Service: General;  Laterality: Right;    CARPAL TUNNEL RELEASE     bilateral   CERVICAL CERCLAGE  07/1991; 05/1999   x 2   CESAREAN SECTION     x 1   CHOLECYSTECTOMY, LAPAROSCOPIC  1996   COLONOSCOPY WITH PROPOFOL  N/A 06/24/2017   Procedure: COLONOSCOPY WITH PROPOFOL ;  Surgeon: Kristie Lamprey, MD;  Location: [facility];  Service: Endoscopy;  Laterality: N/A;   COLONOSCOPY WITH PROPOFOL  N/A 11/29/2022   Procedure: COLONOSCOPY WITH PROPOFOL ;  Surgeon: Rollin Dover, MD;  Location: [facility];  Service: Gastroenterology;  Laterality: N/A;   CYSTOSCOPY N/A 06/07/2014   Procedure: CYSTOSCOPY;  Surgeon: Ronal GORMAN Pinal, MD;  Location: [facility];  Service: Gynecology;  Laterality: N/A;   LAPAROSCOPIC BILATERAL SALPINGO OOPHERECTOMY Right 06/07/2014   Procedure: LAPAROSCOPIC RIGHT SALPINGO OOPHORECTOMY/COLLECTION OF PELVIC WASHINGS;  Surgeon: Ronal GORMAN Pinal, MD;  Location: [facility];  Service: Gynecology;  Laterality: Right;   LAPAROSCOPIC UNILATERAL SALPINGECTOMY Left 06/07/2014   Procedure: LAPAROSCOPIC UNILATERAL SALPINGECTOMY;  Surgeon: Ronal GORMAN Pinal, MD;  Location: [facility];  Service: Gynecology;  Laterality: Left;   ORIF ANKLE FRACTURE Right 06/07/2013   Procedure: OPEN REDUCTION INTERNAL FIXATION (ORIF) ANKLE FRACTURE  FIBULA SYNDESMOSIS;  Surgeon: Toribio JULIANNA Chancy, MD;  Location: [facility];  Service: Orthopedics;  Laterality: Right;   POLYPECTOMY  11/29/2022   Procedure: POLYPECTOMY;  Surgeon: Rollin Dover, MD;  Location: [facility];  Service: Gastroenterology;;   RE-EXCISION OF BREAST CANCER,SUPERIOR MARGINS Right 06/01/2020   Procedure: RIGHT BREAST MARGIN REEXCISION;  Surgeon: Curvin Deward MOULD, MD;  Location[facility];  Service: General;  Laterality: Right;   TRIGGER FINGER RELEASE Right 01/30/2021    Current Outpatient Medications  Medication Sig Dispense Refill   anastrozole  (ARIMIDEX ) 1 MG tablet Take 1 tablet (1 mg total) by mouth daily. 90 tablet 3   aspirin  81 MG chewable tablet Chew 1 tablet (81 mg total) by mouth  2 (two) times daily to prevent blood clots after surgery 60 tablet 0   atorvastatin  (LIPITOR) 40 MG tablet Take 1 tablet (40 mg total) by mouth at bedtime. 90 tablet 3   buPROPion  (WELLBUTRIN  XL) 150 MG 24 hr tablet Take one 150mg  tablet daily with one 300mg  tablet daily for full dose of 450mg . 90 tablet 3   buPROPion  (WELLBUTRIN  XL) 300 MG 24 hr tablet Take one 300mg  tablet daily combined with one 150mg  tablet daily for full dose of  450mg . 90 tablet 3   citalopram  (CELEXA ) 20 MG tablet Take 1 tablet (20 mg total) by mouth daily. 90 tablet 3   empagliflozin  (JARDIANCE ) 10 MG TABS tablet Take 1 tablet (10 mg total) by mouth daily. 90 tablet 3   gabapentin  (NEURONTIN ) 300 MG capsule Take 1 capsule (300 mg total) by mouth 2 (two) times daily as needed for  pain, burning and numbness 60 capsule 1   HYDROcodone -acetaminophen  (NORCO) 10-325 MG tablet Take 1 tablet by mouth every 6-8 hours as needed for pain 28 tablet 0   levocetirizine (XYZAL ) 5 MG tablet Take 1 tablet (5 mg total) by mouth every evening.     meloxicam  (MOBIC ) 15 MG tablet Take 1 tablet (15 mg total) by mouth daily as needed for pain and swelling. DO NOT TAKE AT THE SAME TIME AS IBUPROFEN  OR CELEBREX  30 tablet 2   metoprolol  tartrate (LOPRESSOR ) 100 MG tablet Take 1 tablet (100 mg total) by mouth 2 (two) times daily. 180 tablet 3   Multiple Vitamin (MULTIVITAMIN ADULT) TABS Take 1 tablet by mouth daily. [place] [employer] Brand     ondansetron  (ZOFRAN -ODT) 4 MG disintegrating tablet Take 1 tablet (4 mg total) by mouth 3 (three) times daily as needed for nausea and vomiting 15 tablet 0   phenylephrine  (SUDAFED PE) 10 MG TABS tablet Take 10 mg by mouth every 6 (six) hours as needed (for congestion).     Semaglutide , 1 MG/DOSE, 4 MG/3ML SOPN Inject 1 mg into the skin once a week. 9 mL 1   spironolactone  (ALDACTONE ) 100 MG tablet Take 1 tablet (100 mg total) by mouth 2 (two) times daily. 180 tablet 3   No current facility-administered  medications for this visit.    ALLERGIES: Other, Soy allergy (do not select), Celery oil, Corylus, Egg-derived products, and Tape  Family History  Problem Relation Age of Onset   Hypertension Mother    Infertility Mother    Deep vein thrombosis Mother    Anxiety disorder Mother    Depression Mother    Hepatitis C Mother        blood transfusion   Aneurysm Mother        Japanese   Cancer Father        Gallbladder; dx 25s   Hypertension Father    Anxiety disorder Father    Depression Father    Multiple births Sister    Thyroid  disease Sister    Anxiety disorder Sister    Depression Sister    Breast cancer Sister 88   Thyroid  cancer Sister 75       unknown type   Hypertension Brother    Anxiety disorder Brother    Depression Brother    Prostate cancer Paternal Uncle        dx 56s   Lung cancer Half-Sister 62       maternal half sister; no smoking hx    Review of Systems  All other systems reviewed and are negative.   PHYSICAL EXAM:  BP 122/82 (BP Location: Left Arm, Patient Position: Sitting, Cuff Size: Small)   Ht 5' 1.5 (1.562 m)   Wt 184 lb (83.5 kg)   LMP 12/31/2015 (Within Weeks)   BMI 34.20 kg/m     General appearance: alert, cooperative and appears stated age Head: normocephalic, without obvious abnormality, atraumatic Neck: no adenopathy, supple, symmetrical, trachea midline and thyroid  normal to inspection and palpation Lungs: clear to auscultation bilaterally Breasts: bilateral breast scars, no masses, no nipple discharge, no axillary adenopathy Heart: regular rate and rhythm Abdomen: soft, non-tender; no masses, no organomegaly Extremities: extremities normal, atraumatic, no cyanosis or edema Skin: skin color, texture, turgor normal. No rashes or lesions Lymph nodes: cervical, supraclavicular, and axillary nodes normal. Neurologic: grossly normal  Pelvic: External genitalia:  no lesions              No abnormal  inguinal nodes palpated.               Urethra:  normal appearing urethra with no masses, tenderness or lesions              Bartholins and Skenes: normal                 Vagina: normal appearing vagina with normal color and discharge, no lesions              Cervix: no lesions              Pap taken: no Bimanual Exam:  Uterus:  normal size, contour, position, consistency, mobility, non-tender              Adnexa: no mass, fullness, tenderness              Rectal exam: yes.  Confirms.              Anus:  normal sphincter tone, no lesions  Chaperone was present for exam:  Damien FALCON, CMA  ASSESSMENT: Well woman visit with gynecologic exam Status post bilateral salpingectomy and right oophorectomy. Hx pap LGSIL, neg HR HPV.  Last pap 2023 ASCUS and negative HR HPV.  Hx right breast cancer.  Status post lumpectomy with re-excision, XRT and anastrozole .  Genetic testing negative.  Hirsutism and hair loss on head.  On Spironolactone . Elevated calcium .  Depression and anxiety. On Wellbutrin  and Celexa  through her PCP.    PLAN: Mammogram screening discussed. Self breast awareness reviewed. Pap and HRV collected:  no.  Due in 2026.  Guidelines for Calcium , Vitamin D , regular exercise program including cardiovascular and weight bearing exercise. Medication refills:  NA Bone density recommended for July 2025.  Dr. Gudena following. I recommend she discuss her use of Spironolactone  with her oncologist.  Follow up:  yearly and prn.

## 2023-03-31 ENCOUNTER — Other Ambulatory Visit (HOSPITAL_BASED_OUTPATIENT_CLINIC_OR_DEPARTMENT_OTHER): Payer: Self-pay

## 2023-03-31 ENCOUNTER — Telehealth: Payer: Self-pay | Admitting: Hematology and Oncology

## 2023-03-31 NOTE — Telephone Encounter (Signed)
Patient is aware of rescheduled appointment times/dates due to provider being out of office

## 2023-04-01 DIAGNOSIS — G5620 Lesion of ulnar nerve, unspecified upper limb: Secondary | ICD-10-CM | POA: Insufficient documentation

## 2023-04-01 DIAGNOSIS — R61 Generalized hyperhidrosis: Secondary | ICD-10-CM | POA: Insufficient documentation

## 2023-04-01 NOTE — Assessment & Plan Note (Signed)
Scheduled for surgery on March 27, 2023, for cubital tunnel syndrome. Symptoms include numbness in the pinky finger. Discussed stopping anastrozole the night before surgery. - Proceed with scheduled surgery - Stop anastrozole the night before surgery

## 2023-04-01 NOTE — Assessment & Plan Note (Signed)
Hyperlipidemia managed with atorvastatin. Recent cholesterol levels within acceptable range. - Continue atorvastatin

## 2023-04-01 NOTE — Assessment & Plan Note (Signed)
 Night sweats likely secondary to anastrozole . Discussed non-hormonal treatment options including Veozah and clonidine patches. Explained that Veozah targets the receptor involved in hot flashes without using hormones. Clonidine, typically a blood pressure medication, can also be used in low doses for this purpose. - Consider Veozah or clonidine patches post-surgery if symptoms persist - Review treatment options and decide post-surgery

## 2023-04-01 NOTE — Assessment & Plan Note (Signed)
Hypertension managed with spironolactone and metoprolol. Blood pressure well controlled. - Continue current antihypertensive regimen

## 2023-04-01 NOTE — Assessment & Plan Note (Signed)
 Diabetes managed with Jardiance  and previously Ozempic . Blood sugars well controlled. Stopped Ozempic  due to upcoming surgery. Discussed stopping Ozempic  one week before surgery and resuming post-surgery. - Resume Ozempic  1 mg post-surgery - Monitor blood sugars

## 2023-04-08 ENCOUNTER — Encounter: Payer: Self-pay | Admitting: Obstetrics and Gynecology

## 2023-04-08 ENCOUNTER — Ambulatory Visit (INDEPENDENT_AMBULATORY_CARE_PROVIDER_SITE_OTHER): Payer: Managed Care, Other (non HMO) | Admitting: Obstetrics and Gynecology

## 2023-04-08 VITALS — BP 122/82 | Ht 61.5 in | Wt 184.0 lb

## 2023-04-08 DIAGNOSIS — Z01419 Encounter for gynecological examination (general) (routine) without abnormal findings: Secondary | ICD-10-CM | POA: Diagnosis not present

## 2023-04-08 NOTE — Patient Instructions (Signed)

## 2023-05-07 ENCOUNTER — Other Ambulatory Visit (HOSPITAL_COMMUNITY): Payer: Self-pay

## 2023-05-19 ENCOUNTER — Ambulatory Visit: Payer: Commercial Managed Care - PPO | Admitting: Hematology and Oncology

## 2023-05-19 NOTE — Assessment & Plan Note (Signed)
 02/29/2020: Screening detected right breast cancer 0.6 cm at 10 o'clock position with multiple benign cysts.  Biopsy 02/29/2020: Grade 1 IDC with DCIS ER/PR greater than 95%, Ki-67: 5%, HER-2 negative T1BN0 stage Ia 04/20/2020: Right lumpectomy: Grade 1 IDC 0.9 cm, intermediate grade DCIS, margins negative, DCIS focally positive posterior margin and less than 1 mm from anterior margin.  0/2 lymph nodes negative, 06/01/2020: Margin excision: Benign   Treatment plan: 1.  Adjuvant radiation therapy 07/18/2020-08/08/2020 2. followed by adjuvant antiestrogen therapy with anastrozole 1 mg daily x5 years started May 2022    Anastrozole toxicities: Hot flashes: They can be quite severe for her.   Breast cancer surveillance: 1.  Breast exam 05/20/2023: Benign 2. mammogram 05/14/2022:  3. Bone Density: 10/26/21: T score -1.7   Right lumpectomy 03/29/2021: Residual CSL with calcifications Since there is no evidence of DCIS or invasive cancer, we can see her once a year.   Patient had a new grandbaby a month ago and she has been very busy taking care of her grandson.  Her daughter lives in [place].   Return to clinic in 1 year for follow-up

## 2023-05-20 ENCOUNTER — Inpatient Hospital Stay: Payer: Managed Care, Other (non HMO) | Attending: Hematology and Oncology | Admitting: Hematology and Oncology

## 2023-05-20 ENCOUNTER — Other Ambulatory Visit (HOSPITAL_COMMUNITY): Payer: Self-pay

## 2023-05-20 VITALS — BP 96/67 | HR 89 | Temp 98.0°F | Resp 16 | Ht 61.5 in | Wt 187.4 lb

## 2023-05-20 DIAGNOSIS — R232 Flushing: Secondary | ICD-10-CM | POA: Diagnosis not present

## 2023-05-20 DIAGNOSIS — G2581 Restless legs syndrome: Secondary | ICD-10-CM | POA: Insufficient documentation

## 2023-05-20 DIAGNOSIS — Z170 Estrogen receptor positive status [ER+]: Secondary | ICD-10-CM | POA: Insufficient documentation

## 2023-05-20 DIAGNOSIS — Z923 Personal history of irradiation: Secondary | ICD-10-CM | POA: Insufficient documentation

## 2023-05-20 DIAGNOSIS — Z79899 Other long term (current) drug therapy: Secondary | ICD-10-CM | POA: Diagnosis not present

## 2023-05-20 DIAGNOSIS — Z79811 Long term (current) use of aromatase inhibitors: Secondary | ICD-10-CM | POA: Insufficient documentation

## 2023-05-20 DIAGNOSIS — C50411 Malignant neoplasm of upper-outer quadrant of right female breast: Secondary | ICD-10-CM | POA: Diagnosis present

## 2023-05-20 MED ORDER — ANASTROZOLE 1 MG PO TABS
1.0000 mg | ORAL_TABLET | Freq: Every day | ORAL | 3 refills | Status: AC
  Filled 2023-05-20 – 2023-08-04 (×2): qty 90, 90d supply, fill #0
  Filled 2023-11-19: qty 90, 90d supply, fill #1
  Filled 2024-02-16: qty 90, 90d supply, fill #2

## 2023-05-20 NOTE — Progress Notes (Signed)
 Patient Care Team: Early, Sung Amabile, NP as PCP - General (Nurse Practitioner) Serena Croissant, MD as Consulting Physician (Hematology and Oncology) Dorothy Puffer, MD as Consulting Physician (Radiation Oncology) Griselda Miner, MD as Consulting Physician (General Surgery) Talmage Coin, MD as Consulting Physician (Endocrinology) Ardell Isaacs, Forrestine Him, MD as Consulting Physician (Obstetrics and Gynecology)  DIAGNOSIS:  Encounter Diagnosis  Name Primary?   Malignant neoplasm of upper-outer quadrant of right breast in female, estrogen receptor positive (HCC) Yes    SUMMARY OF ONCOLOGIC HISTORY: Oncology History  Malignant neoplasm of upper-outer quadrant of right breast in female, estrogen receptor positive (HCC)  02/29/2020 Initial Diagnosis   Screening mammogram detected right breast mass. 0.6cm mass at the 10 o'clock position with multiple benign cysts. Biopsy on 02/29/20 showed invasive ductal carcinoma with DCIS, grade 1, HER-2 negative (1+), ER/PR >95%, Ki67: 5%.    03/21/2020 Cancer Staging   Staging form: Breast, AJCC 8th Edition - Clinical stage from 03/21/2020: Stage IA (cT1b, cN0, cM0, G1, ER+, PR+, HER2-) - Signed by Serena Croissant, MD on 03/21/2020   04/16/2020 Genetic Testing   Negative genetic testing: no pathogenic variants detected in [facility] Multi-Cancer Panel.  The report date is April 16, 2020.    The Multi-Cancer Panel offered by [facility] includes sequencing and/or deletion duplication testing of the following 85 genes: AIP, ALK, APC, ATM, AXIN2,BAP1,  BARD1, BLM, BMPR1A, BRCA1, BRCA2, BRIP1, CASR, CDC73, CDH1, CDK4, CDKN1B, CDKN1C, CDKN2A (p14ARF), CDKN2A (p16INK4a), CEBPA, CHEK2, CTNNA1, DICER1, DIS3L2, EGFR (c.2369C>T, p.Thr790Met variant only), EPCAM (Deletion/duplication testing only), FH, FLCN, GATA2, GPC3, GREM1 (Promoter region deletion/duplication testing only), HOXB13 (c.251G>A, p.Gly84Glu), HRAS, KIT, MAX, MEN1, MET, MITF (c.952G>A, p.Glu318Lys variant only),  MLH1, MSH2, MSH3, MSH6, MUTYH, NBN, NF1, NF2, NTHL1, PALB2, PDGFRA, PHOX2B, PMS2, POLD1, POLE, POT1, PRKAR1A, PTCH1, PTEN, RAD50, RAD51C, RAD51D, RB1, RECQL4, RET, RNF43, RUNX1, SDHAF2, SDHA (sequence changes only), SDHB, SDHC, SDHD, SMAD4, SMARCA4, SMARCB1, SMARCE1, STK11, SUFU, TERC, TERT, TMEM127, TP53, TSC1, TSC2, VHL, WRN and WT1.    04/20/2020 Surgery   Right lumpectomy Carolynne Edouard): invasive and in situ carcinoma, grade 1, 0.9cm, DCIS focally present at margin, 2 right axillary lymph nodes negative for carcinoma.   04/20/2020 Cancer Staging   Staging form: Breast, AJCC 8th Edition - Pathologic stage from 04/20/2020: Stage IA (pT1b, pN0, cM0, G1, ER+, PR+, HER2-) - Signed by Loa Socks, NP on 11/06/2020 Stage prefix: Initial diagnosis Histologic grading system: 3 grade system   06/01/2020 Surgery   Re-excision of positive margin Carolynne Edouard): no malignancy identified    07/18/2020 -  Radiation Therapy   Adjuvant radiation   07/2020 -  Anti-estrogen oral therapy   Anastrozole daily     CHIEF COMPLIANT:   HISTORY OF PRESENT ILLNESS:  History of Present Illness   Nicole Khan "Nicole Khan" is a 58 year old female who presents for follow-up after surgery and ongoing medication management.  She is here for a follow-up visit, approximately three years after her surgery, with no current issues related to the procedure.  Her major issue last year was severe hot flashes, which have since improved. She was prescribed gabapentin, which has helped alleviate the hot flashes. She has been on this medication for almost three years.  No current chest pain or discomfort.         ALLERGIES:  is allergic to other, soy allergy (obsolete), celery oil, corylus, egg-derived products, and tape.  MEDICATIONS:  Current Outpatient Medications  Medication Sig Dispense Refill   anastrozole (ARIMIDEX) 1  MG tablet Take 1 tablet (1 mg total) by mouth daily. 90 tablet 3   aspirin 81 MG chewable tablet  Chew 1 tablet (81 mg total) by mouth 2 (two) times daily to prevent blood clots after surgery 60 tablet 0   atorvastatin (LIPITOR) 40 MG tablet Take 1 tablet (40 mg total) by mouth at bedtime. 90 tablet 3   buPROPion (WELLBUTRIN XL) 150 MG 24 hr tablet Take one 150mg  tablet daily with one 300mg  tablet daily for full dose of 450mg . 90 tablet 3   buPROPion (WELLBUTRIN XL) 300 MG 24 hr tablet Take one 300mg  tablet daily combined with one 150mg  tablet daily for full dose of  450mg . 90 tablet 3   citalopram (CELEXA) 20 MG tablet Take 1 tablet (20 mg total) by mouth daily. 90 tablet 3   empagliflozin (JARDIANCE) 10 MG TABS tablet Take 1 tablet (10 mg total) by mouth daily. 90 tablet 3   gabapentin (NEURONTIN) 300 MG capsule Take 1 capsule (300 mg total) by mouth 2 (two) times daily as needed for pain, burning and numbness 60 capsule 1   HYDROcodone-acetaminophen (NORCO) 10-325 MG tablet Take 1 tablet by mouth every 6-8 hours as needed for pain 28 tablet 0   levocetirizine (XYZAL) 5 MG tablet Take 1 tablet (5 mg total) by mouth every evening.     meloxicam (MOBIC) 15 MG tablet Take 1 tablet (15 mg total) by mouth daily as needed for pain and swelling. DO NOT TAKE AT THE SAME TIME AS IBUPROFEN OR CELEBREX 30 tablet 2   metoprolol tartrate (LOPRESSOR) 100 MG tablet Take 1 tablet (100 mg total) by mouth 2 (two) times daily. 180 tablet 3   Multiple Vitamin (MULTIVITAMIN ADULT) TABS Take 1 tablet by mouth daily. [employer]     ondansetron (ZOFRAN-ODT) 4 MG disintegrating tablet Take 1 tablet (4 mg total) by mouth 3 (three) times daily as needed for nausea and vomiting 15 tablet 0   phenylephrine (SUDAFED PE) 10 MG TABS tablet Take 10 mg by mouth every 6 (six) hours as needed (for congestion).     Semaglutide, 1 MG/DOSE, 4 MG/3ML SOPN Inject 1 mg into the skin once a week. 9 mL 1   spironolactone (ALDACTONE) 100 MG tablet Take 1 tablet (100 mg total) by mouth 2 (two) times daily. 180 tablet 3   No  current facility-administered medications for this visit.    PHYSICAL EXAMINATION: ECOG PERFORMANCE STATUS: 1 - Symptomatic but completely ambulatory  Vitals:   05/20/23 1149  BP: 96/67  Pulse: 89  Resp: 16  Temp: 98 F (36.7 C)  SpO2: 97%   Filed Weights   05/20/23 1149  Weight: 187 lb 6.4 oz (85 kg)      LABORATORY DATA:  I have reviewed the data as listed    Latest Ref Rng & Units 03/20/2023   12:14 PM 08/09/2022   12:33 PM 03/22/2021    3:16 PM  CMP  Glucose 70 - 99 mg/dL 98  86  073   BUN 6 - 24 mg/dL 15  14  14    Creatinine 0.57 - 1.00 mg/dL 7.10  6.26  9.48   Sodium 134 - 144 mmol/L 141  142  141   Potassium 3.5 - 5.2 mmol/L 4.7  4.8  4.3   Chloride 96 - 106 mmol/L 102  103  106   CO2 20 - 29 mmol/L 18  20  24    Calcium 8.7 - 10.2 mg/dL 54.6  27.0  9.8   Total Protein 6.0 - 8.5 g/dL 7.2  6.9    Total Bilirubin 0.0 - 1.2 mg/dL 0.8  0.9    Alkaline Phos 44 - 121 IU/L 102  109    AST 0 - 40 IU/L 23  19    ALT 0 - 32 IU/L 24  23      Lab Results  Component Value Date   WBC 7.9 03/20/2023   HGB 17.4 (H) 03/20/2023   HCT 52.0 (H) 03/20/2023   MCV 98 (H) 03/20/2023   PLT 326 03/20/2023   NEUTROABS 5.3 03/20/2023    ASSESSMENT & PLAN:  Malignant neoplasm of upper-outer quadrant of right breast in female, estrogen receptor positive (HCC) 02/29/2020: Screening detected right breast cancer 0.6 cm at 10 o'clock position with multiple benign cysts.  Biopsy 02/29/2020: Grade 1 IDC with DCIS ER/PR greater than 95%, Ki-67: 5%, HER-2 negative T1BN0 stage Ia 04/20/2020: Right lumpectomy: Grade 1 IDC 0.9 cm, intermediate grade DCIS, margins negative, DCIS focally positive posterior margin and less than 1 mm from anterior margin.  0/2 lymph nodes negative, 06/01/2020: Margin excision: Benign   Treatment plan: 1.  Adjuvant radiation therapy 07/18/2020-08/08/2020 2. followed by adjuvant antiestrogen therapy with anastrozole 1 mg daily x5 years started May 2022     Anastrozole toxicities: Hot flashes: Improved she started taking gabapentin for restless legs.   Breast cancer surveillance: 1.  Breast exam 05/20/2023: Benign 2. mammogram 05/14/2022:  3. Bone Density: 10/26/21: T score -1.7   Right lumpectomy 03/29/2021: Residual CSL with calcifications Since there is no evidence of DCIS or invasive cancer, we can see her once a year.   Teena Dunk is now 22-year-old.   Return to clinic in 1 year for follow-up ------------------------------------- Assessment and Plan    Hot Flashes Severe hot flashes for the past year, alleviated by gabapentin, which is an off-label use particularly beneficial if there are other indications such as restless legs syndrome. - Continue gabapentin as prescribed  Restless Legs Syndrome Gabapentin prescribed for restless legs syndrome has also alleviated hot flashes. Gabapentin is effective in reducing symptoms of restless legs syndrome. - Continue gabapentin as prescribed  Post-Surgical Follow-Up Approximately three years post-surgery, on medication for about two and a half years with no specific issues discussed, indicating a well-managed post-surgical status. - Continue current medication regimen  General Health Maintenance No chest pain or discomfort reported, indicating well-managed cardiovascular health. - Perform routine physical examination.          No orders of the defined types were placed in this encounter.  The patient has a good understanding of the overall plan. she agrees with it. she will call with any problems that may develop before the next visit here. Total time spent: 30 mins including face to face time and time spent for planning, charting and co-ordination of care   Tamsen Meek, MD 05/20/23

## 2023-06-11 ENCOUNTER — Other Ambulatory Visit: Payer: Self-pay | Admitting: Nurse Practitioner

## 2023-06-11 DIAGNOSIS — Z853 Personal history of malignant neoplasm of breast: Secondary | ICD-10-CM

## 2023-06-23 ENCOUNTER — Other Ambulatory Visit (HOSPITAL_COMMUNITY): Payer: Self-pay

## 2023-06-27 ENCOUNTER — Ambulatory Visit
Admission: RE | Admit: 2023-06-27 | Discharge: 2023-06-27 | Disposition: A | Discharge status: Home / Self Care | Source: Ambulatory Visit | Attending: Nurse Practitioner | Admitting: Nurse Practitioner

## 2023-06-27 DIAGNOSIS — Z853 Personal history of malignant neoplasm of breast: Secondary | ICD-10-CM

## 2023-06-29 ENCOUNTER — Encounter: Payer: Self-pay | Admitting: Nurse Practitioner

## 2023-08-04 ENCOUNTER — Other Ambulatory Visit (HOSPITAL_COMMUNITY): Payer: Self-pay

## 2023-08-11 ENCOUNTER — Ambulatory Visit: Payer: Managed Care, Other (non HMO) | Attending: Hematology and Oncology

## 2023-08-11 VITALS — Wt 187.4 lb

## 2023-08-11 DIAGNOSIS — Z483 Aftercare following surgery for neoplasm: Secondary | ICD-10-CM | POA: Insufficient documentation

## 2023-08-11 NOTE — Therapy (Signed)
 OUTPATIENT PHYSICAL THERAPY SOZO SCREENING NOTE   Patient Name: Nicole Khan MRN: [id number] DOB:08-06-1965, 58 y.o., female Today's Date: 08/11/2023  PCP: Annella Kief, NP REFERRING PROVIDER: Cameron Cea, MD   PT End of Session - 08/11/23 1100     Visit Number 4   # unchanged due to screen only   PT Start Time 1059    PT Stop Time 1103    PT Time Calculation (min) 4 min    Activity Tolerance Patient tolerated treatment well    Behavior During Therapy [facility] for tasks assessed/performed             Past Medical History:  Diagnosis Date   Abnormal mammogram of left breast 09/26/2017   Abnormal Pap smear of cervix    Anxiety    Breast cancer (HCC)    Cancer (HCC)    Depression    Diabetes mellitus without complication (HCC)    type 2   Family history of breast cancer 04/11/2020   Family history of breast cancer 04/11/2020   Family history of prostate cancer 04/11/2020   Family history of prostate cancer 04/11/2020   Family history of thyroid  cancer 04/11/2020   Family history of thyroid  cancer 04/11/2020   Fracture dislocation of right ankle joint 06/04/2013   Fracture of distal fibula 06/04/2013   Fracture of distal fibula 06/04/2013   H/O bilateral salpingectomy 05/2015   History of kidney stones    passed stones, no surgery required   Hypercholesteremia    Hypertension    Personal history of radiation therapy    Polycystic ovary disease    PONV (postoperative nausea and vomiting)    Sleep apnea 05/2017   uses cpap   SVD (spontaneous vaginal delivery)    x 1   Past Surgical History:  Procedure Laterality Date   BREAST BIOPSY Right 02/29/2020   BREAST EXCISIONAL BIOPSY Left 2019   Benign   BREAST LUMPECTOMY     BREAST LUMPECTOMY WITH RADIOACTIVE SEED AND SENTINEL LYMPH NODE BIOPSY Right 04/20/2020   Procedure: RIGHT BREAST LUMPECTOMY WITH RADIOACTIVE SEED AND SENTINEL LYMPH NODE BIOPSY;  Surgeon: Caralyn Chandler, MD;  Location: [facility] OR;  Service:  General;  Laterality: Right;   BREAST LUMPECTOMY WITH RADIOACTIVE SEED LOCALIZATION Left 09/26/2017   Procedure: LEFT BREAST LUMPECTOMY WITH RADIOACTIVE SEED LOCALIZATION ERAS PATHWAY;  Surgeon: Boyce Byes, MD;  Location: [facility];  Service: General;  Laterality: Left;   BREAST LUMPECTOMY WITH RADIOACTIVE SEED LOCALIZATION Right 03/29/2021   Procedure: RIGHT BREAST LUMPECTOMY WITH RADIOACTIVE SEED LOCALIZATION;  Surgeon: Caralyn Chandler, MD;  Location: [facility] OR;  Service: General;  Laterality: Right;   CARPAL TUNNEL RELEASE     bilateral   CERVICAL CERCLAGE  07/1991; 05/1999   x 2   CESAREAN SECTION     x 1   CHOLECYSTECTOMY, LAPAROSCOPIC  1996   COLONOSCOPY WITH PROPOFOL  N/A 06/24/2017   Procedure: COLONOSCOPY WITH PROPOFOL ;  Surgeon: Tami Falcon, MD;  Location: [facility];  Service: Endoscopy;  Laterality: N/A;   COLONOSCOPY WITH PROPOFOL  N/A 11/29/2022   Procedure: COLONOSCOPY WITH PROPOFOL ;  Surgeon: Alvis Jourdain, MD;  Location: [facility];  Service: Gastroenterology;  Laterality: N/A;   CYSTOSCOPY N/A 06/07/2014   Procedure: CYSTOSCOPY;  Surgeon: Lillian Rein, MD;  Location: [facility] ORS;  Service: Gynecology;  Laterality: N/A;   LAPAROSCOPIC BILATERAL SALPINGO OOPHERECTOMY Right 06/07/2014   Procedure: LAPAROSCOPIC RIGHT SALPINGO OOPHORECTOMY/COLLECTION OF PELVIC WASHINGS;  Surgeon: Lillian Rein, MD;  Location: [facility]  ORS;  Service: Gynecology;  Laterality: Right;   LAPAROSCOPIC UNILATERAL SALPINGECTOMY Left 06/07/2014   Procedure: LAPAROSCOPIC UNILATERAL SALPINGECTOMY;  Surgeon: Lillian Rein, MD;  Location: [facility] ORS;  Service: Gynecology;  Laterality: Left;   ORIF ANKLE FRACTURE Right 06/07/2013   Procedure: OPEN REDUCTION INTERNAL FIXATION (ORIF) ANKLE FRACTURE  FIBULA SYNDESMOSIS;  Surgeon: Ferd Householder, MD;  Location: [facility];  Service: Orthopedics;  Laterality: Right;   POLYPECTOMY  11/29/2022   Procedure: POLYPECTOMY;  Surgeon: Alvis Jourdain, MD;   Location: [facility];  Service: Gastroenterology;;   RE-EXCISION OF BREAST CANCER,SUPERIOR MARGINS Right 06/01/2020   Procedure: RIGHT BREAST MARGIN REEXCISION;  Surgeon: Caralyn Chandler, MD;  Location[facility] OR;  Service: General;  Laterality: Right;   TRIGGER FINGER RELEASE Right 01/30/2021   Patient Active Problem List   Diagnosis Date Noted   Cubital tunnel syndrome 04/01/2023   Night sweats 04/01/2023   Cellulitis of toe of left foot 02/18/2023   Type 2 diabetes mellitus with hyperglycemia, without long-term current use of insulin (HCC) 08/09/2022   Hyperlipidemia associated with type 2 diabetes mellitus (HCC) 08/09/2022   Allergic rhinitis 11/08/2020   Exercise induced bronchospasm 11/08/2020   Encounter for annual physical exam 04/17/2020   Malignant neoplasm of upper-outer quadrant of right breast in female, estrogen receptor positive (HCC) 03/21/2020   Abnormal mammogram of left breast 09/26/2017   Bilateral ovarian cysts 05/23/2014   Hypertension associated with diabetes (HCC) 04/07/2013   PCOS (polycystic ovarian syndrome) 04/07/2013   Adjustment disorder with mixed anxiety and depressed mood 04/07/2013    REFERRING DIAG: right breast cancer at risk for lymphedema  THERAPY DIAG: Aftercare following surgery for neoplasm  PERTINENT HISTORY: right breast cancer diagnosed with bio Pt had reexcision on June 01, 2020.  biopsy on 02/29/20 showed invasive ductal carcinoma with DCIS, grade 1, HER-2 negative (1+), ER/PR >95%, Ki 67 5%. Surgery is scheduled for Jan 20 and she will have radiation.  Past history includes, DM, sleep apnea, bilaterl carpal tunnel surgery( 2006) and right trigger finger surgery ( 2011)   PRECAUTIONS: right UE Lymphedema risk, None  SUBJECTIVE: Pt returns for her 6 month L-Dex screen.   PAIN:  Are you having pain? No  SOZO SCREENING: Patient was assessed today using the SOZO machine to determine the lymphedema index score. This was compared to her  baseline score. It was determined that she is within the recommended range when compared to her baseline and no further action is needed at this time. She will continue SOZO screenings. These are done every 3 months for 2 years post operatively followed by every 6 months for 2 years, and then annually.   L-DEX FLOWSHEETS - 08/11/23 1100       L-DEX LYMPHEDEMA SCREENING   Measurement Type Unilateral    L-DEX MEASUREMENT EXTREMITY Upper Extremity    POSITION  Standing    DOMINANT SIDE Right    At Risk Side Right    BASELINE SCORE (UNILATERAL) 1.8    L-DEX SCORE (UNILATERAL) -1.7    VALUE CHANGE (UNILAT) -3.5              Denyce Flank, PTA 08/11/2023, 11:01 AM

## 2023-09-18 ENCOUNTER — Other Ambulatory Visit (HOSPITAL_COMMUNITY): Payer: Self-pay

## 2023-09-18 ENCOUNTER — Ambulatory Visit: Payer: Managed Care, Other (non HMO) | Admitting: Nurse Practitioner

## 2023-09-18 ENCOUNTER — Encounter: Payer: Self-pay | Admitting: Nurse Practitioner

## 2023-09-18 VITALS — BP 128/78 | HR 72 | Wt 188.0 lb

## 2023-09-18 DIAGNOSIS — E1169 Type 2 diabetes mellitus with other specified complication: Secondary | ICD-10-CM | POA: Diagnosis not present

## 2023-09-18 DIAGNOSIS — E785 Hyperlipidemia, unspecified: Secondary | ICD-10-CM

## 2023-09-18 DIAGNOSIS — E282 Polycystic ovarian syndrome: Secondary | ICD-10-CM | POA: Diagnosis not present

## 2023-09-18 DIAGNOSIS — R238 Other skin changes: Secondary | ICD-10-CM

## 2023-09-18 DIAGNOSIS — E1165 Type 2 diabetes mellitus with hyperglycemia: Secondary | ICD-10-CM | POA: Diagnosis not present

## 2023-09-18 DIAGNOSIS — B379 Candidiasis, unspecified: Secondary | ICD-10-CM

## 2023-09-18 DIAGNOSIS — C50411 Malignant neoplasm of upper-outer quadrant of right female breast: Secondary | ICD-10-CM

## 2023-09-18 DIAGNOSIS — G2581 Restless legs syndrome: Secondary | ICD-10-CM

## 2023-09-18 DIAGNOSIS — Z23 Encounter for immunization: Secondary | ICD-10-CM | POA: Diagnosis not present

## 2023-09-18 DIAGNOSIS — I152 Hypertension secondary to endocrine disorders: Secondary | ICD-10-CM

## 2023-09-18 DIAGNOSIS — Z170 Estrogen receptor positive status [ER+]: Secondary | ICD-10-CM

## 2023-09-18 DIAGNOSIS — F4323 Adjustment disorder with mixed anxiety and depressed mood: Secondary | ICD-10-CM

## 2023-09-18 DIAGNOSIS — E1159 Type 2 diabetes mellitus with other circulatory complications: Secondary | ICD-10-CM

## 2023-09-18 MED ORDER — GABAPENTIN 300 MG PO CAPS
300.0000 mg | ORAL_CAPSULE | Freq: Two times a day (BID) | ORAL | 3 refills | Status: DC | PRN
  Filled 2023-09-18: qty 180, 90d supply, fill #0
  Filled 2024-01-05: qty 180, 90d supply, fill #1

## 2023-09-18 MED ORDER — FLUCONAZOLE 150 MG PO TABS
150.0000 mg | ORAL_TABLET | ORAL | 2 refills | Status: AC
  Filled 2023-09-18: qty 4, 12d supply, fill #0

## 2023-09-18 MED ORDER — SEMAGLUTIDE (2 MG/DOSE) 8 MG/3ML ~~LOC~~ SOPN
2.0000 mg | PEN_INJECTOR | SUBCUTANEOUS | 3 refills | Status: AC
  Filled 2023-09-18: qty 9, 84d supply, fill #0
  Filled 2023-12-29: qty 9, 84d supply, fill #1

## 2023-09-18 NOTE — Assessment & Plan Note (Addendum)
 Recurrent papule on the neck, previously removed twice, with rapid regrowth. Multiple small scattered skin tags on neck and chest.  Previous removal methods were ineffective. Dermatology evaluation is recommended. - Refer to dermatology for evaluation and possible removal of the recurrent papule and skin check.

## 2023-09-18 NOTE — Assessment & Plan Note (Signed)
 Restless legs syndrome managed with gabapentin , which she takes at bedtime. Prescription has run out, and she requests a refill. Gabapentin  may cause drowsiness if taken during the day. - Prescribe gabapentin  for restless legs syndrome, with the option to take an additional dose during the day if needed.

## 2023-09-18 NOTE — Patient Instructions (Addendum)
 Hold the Jardiance  for 2 weeks while you take fluconazole  to help get rid of any yeast overgrowth. Then restart the Jardiance  every other day.   I have increased the Ozempic  to 2mg  once a week.   I have sent in the gabapentin  for you. You can take this up to twice a day if you need it.

## 2023-09-18 NOTE — Progress Notes (Signed)
 Nicole Fennel, DNP, AGNP-c [facility]  [address] [place], [place] [address] [phone]  ESTABLISHED PATIENT- Chronic Health and/or Follow-Up Visit  Blood pressure 128/78, pulse 72, weight 188 lb (85.3 kg), last menstrual period 12/31/2015.   History of Present Illness Nicole Khan is a 58 year old female who presents for chronic management.   She has a recurrent skin tag on her neck that is itchy. It has been removed twice before, once 24 years ago during her pregnancy and again last year, but it has regrown quickly and has an abnormal shape. She also has several other skin tags and papules on her neck. The areas do not bleed and are not scaly.   She is experiencing recurrent yeast infections, which she associates with her use of Jardiance . She has a history of similar issues when taking Synjardy. She is currently taking Jardiance  and Ozempic  for blood sugar management but does not check her blood sugars at home. She has fluconazole  on hand for yeast infections. She would like to reduce her dose, if possible, to see if this helps reduce the yeast infection incidence.   She takes gabapentin  for restless legs syndrome, which were noted during a previous surgery. She takes it at bedtime and finds it helps with her symptoms, which include a sensation of anxiety and discomfort in her legs, especially when trying to sleep or during flights.  She is on atorvastatin  daily and reports no issues with it. She also takes spironolactone , which she tried to reduce but noticed increased hair growth and oilier skin, so she resumed her regular dose. She is on anastrozole  for breast cancer, which she tolerates well without side effects.  Her sister had breast cancer a year before her, and both had stage one cancer on the right side. Her mother is [place] from near [place], raising concerns about potential genetic or environmental factors contributing to the cancer.    No chest pain, shortness of breath, or dizziness. She mentions feeling a bit tired but attributes it to her current medication regimen. Her mood is stable.   All ROS negative with exception of what is listed above.   PHYSICAL EXAM Physical Exam Vitals and nursing note reviewed.  Constitutional:      General: She is not in acute distress.    Appearance: Normal appearance. She is obese. She is not ill-appearing.  HENT:     Head: Normocephalic.   Eyes:     Conjunctiva/sclera: Conjunctivae normal.     Pupils: Pupils are equal, round, and reactive to light.   Neck:     Vascular: No carotid bruit.   Cardiovascular:     Rate and Rhythm: Normal rate and regular rhythm.     Pulses: Normal pulses.     Heart sounds: Normal heart sounds.  Pulmonary:     Effort: Pulmonary effort is normal.     Breath sounds: Normal breath sounds.  Abdominal:     General: There is no distension.     Palpations: Abdomen is soft.     Tenderness: There is no abdominal tenderness. There is no guarding.   Musculoskeletal:        General: Normal range of motion.     Cervical back: Normal range of motion.     Right lower leg: No edema.     Left lower leg: No edema.  Lymphadenopathy:     Cervical: No cervical adenopathy.   Skin:    General: Skin is warm.  Capillary Refill: Capillary refill takes less than 2 seconds.       Neurological:     General: No focal deficit present.     Mental Status: She is alert and oriented to person, place, and time.   Psychiatric:        Mood and Affect: Mood normal.        Behavior: Behavior normal.      PLAN Problem List Items Addressed This Visit     Hypertension associated with diabetes (HCC)   Blood pressure is well controlled at this time with no alarm symptoms. Currently only on metoprolol  with no side effects. Will continue current measures.       Relevant Medications   Semaglutide , 2 MG/DOSE, 8 MG/3ML SOPN   Other Relevant Orders   CBC with  Differential/Platelet   CMP14+EGFR   PCOS (polycystic ovarian syndrome)   Managing with spironolactone  with no concerns. Recent attempt to taper unsuccessful, therefore, we will plan to continue at the current dose. She is now post-menopausal.       Adjustment disorder with mixed anxiety and depressed mood   Mood stable and well controlled on wellbutrin  and celexa . No alarm symptoms or concerning findings. Will continue to monitor.       Malignant neoplasm of upper-outer quadrant of right breast in female, estrogen receptor positive (HCC)   Managed with tamoxifen without side effects at this time. Discussion about possible genetic link given the timing of her and her sisters cancer occurring at the same age, on the same breast, and with the same make-up. At this time genetic screening available is negative for this. I have encouraged her to continue with her treatments and close monitoring.       Relevant Medications   fluconazole  (DIFLUCAN ) 150 MG tablet   Type 2 diabetes mellitus with hyperglycemia, without long-term current use of insulin (HCC) - Primary   Managed with ozempic  and jardiance . Currently experiencing recurrent yeast infections with jardiance  at 10mg  dose. Adjusting medication today to address the issue. Will monitor labs.  - Hold Jardiance  for two weeks to address yeast overgrowth with fluconazole  - Prescribe fluconazole  with instructions to take one tablet by mouth every three days for a total of four doses over twelve days, with refills available for future symptoms. - Restart Jardiance  every other day after two weeks if yeast infections resolve. - Increase Ozempic  to 2 mg once a week.       Relevant Medications   Semaglutide , 2 MG/DOSE, 8 MG/3ML SOPN   Other Relevant Orders   CBC with Differential/Platelet   CMP14+EGFR   Hemoglobin A1c   Hyperlipidemia associated with type 2 diabetes mellitus (HCC)   Managed with atorvastatin  daily without side effects. No concerns  at this time. Lipid goals LDL less than 70      Relevant Medications   Semaglutide , 2 MG/DOSE, 8 MG/3ML SOPN   Other Relevant Orders   CBC with Differential/Platelet   CMP14+EGFR   Lipid panel   Restless legs syndrome   Restless legs syndrome managed with gabapentin , which she takes at bedtime. Prescription has run out, and she requests a refill. Gabapentin  may cause drowsiness if taken during the day. - Prescribe gabapentin  for restless legs syndrome, with the option to take an additional dose during the day if needed.      Relevant Medications   gabapentin  (NEURONTIN ) 300 MG capsule   Papule of skin   Recurrent papule on the neck, previously removed twice, with rapid regrowth. Multiple  small scattered skin tags on neck and chest.  Previous removal methods were ineffective. Dermatology evaluation is recommended. - Refer to dermatology for evaluation and possible removal of the recurrent papule and skin check.       Other Visit Diagnoses       Need for shingles vaccine       Relevant Orders   Zoster Recombinant (Shingrix ) (Completed)     Need for Tdap vaccination       Relevant Orders   Tdap vaccine greater than or equal to 7yo IM (Completed)     Candida infection       Relevant Medications   fluconazole  (DIFLUCAN ) 150 MG tablet       Return in about 6 months (around 03/19/2024) for CPE.  Nicole Fennel, DNP, AGNP-c

## 2023-09-18 NOTE — Assessment & Plan Note (Signed)
 Managed with tamoxifen without side effects at this time. Discussion about possible genetic link given the timing of her and her sisters cancer occurring at the same age, on the same breast, and with the same make-up. At this time genetic screening available is negative for this. I have encouraged her to continue with her treatments and close monitoring.

## 2023-09-18 NOTE — Assessment & Plan Note (Signed)
 Managed with atorvastatin  daily without side effects. No concerns at this time. Lipid goals LDL less than 70

## 2023-09-18 NOTE — Assessment & Plan Note (Signed)
 Managing with spironolactone  with no concerns. Recent attempt to taper unsuccessful, therefore, we will plan to continue at the current dose. She is now post-menopausal.

## 2023-09-18 NOTE — Assessment & Plan Note (Signed)
 Managed with ozempic  and jardiance . Currently experiencing recurrent yeast infections with jardiance  at 10mg  dose. Adjusting medication today to address the issue. Will monitor labs.  - Hold Jardiance  for two weeks to address yeast overgrowth with fluconazole  - Prescribe fluconazole  with instructions to take one tablet by mouth every three days for a total of four doses over twelve days, with refills available for future symptoms. - Restart Jardiance  every other day after two weeks if yeast infections resolve. - Increase Ozempic  to 2 mg once a week.

## 2023-09-18 NOTE — Assessment & Plan Note (Signed)
 Blood pressure is well controlled at this time with no alarm symptoms. Currently only on metoprolol  with no side effects. Will continue current measures.

## 2023-09-18 NOTE — Assessment & Plan Note (Signed)
 Mood stable and well controlled on wellbutrin  and celexa . No alarm symptoms or concerning findings. Will continue to monitor.

## 2023-09-19 ENCOUNTER — Other Ambulatory Visit (HOSPITAL_COMMUNITY): Payer: Self-pay

## 2023-09-19 LAB — CBC WITH DIFFERENTIAL/PLATELET
Basophils Absolute: 0 10*3/uL (ref 0.0–0.2)
Basos: 1 %
EOS (ABSOLUTE): 0.1 10*3/uL (ref 0.0–0.4)
Eos: 1 %
Hematocrit: 53.4 % — ABNORMAL HIGH (ref 34.0–46.6)
Hemoglobin: 17.6 g/dL — ABNORMAL HIGH (ref 11.1–15.9)
Immature Grans (Abs): 0 10*3/uL (ref 0.0–0.1)
Immature Granulocytes: 0 %
Lymphocytes Absolute: 2.1 10*3/uL (ref 0.7–3.1)
Lymphs: 26 %
MCH: 32.1 pg (ref 26.6–33.0)
MCHC: 33 g/dL (ref 31.5–35.7)
MCV: 97 fL (ref 79–97)
Monocytes Absolute: 0.5 10*3/uL (ref 0.1–0.9)
Monocytes: 6 %
Neutrophils Absolute: 5.2 10*3/uL (ref 1.4–7.0)
Neutrophils: 66 %
Platelets: 269 10*3/uL (ref 150–450)
RBC: 5.49 x10E6/uL — ABNORMAL HIGH (ref 3.77–5.28)
RDW: 12.7 % (ref 11.7–15.4)
WBC: 7.9 10*3/uL (ref 3.4–10.8)

## 2023-09-19 LAB — CMP14+EGFR
ALT: 27 IU/L (ref 0–32)
AST: 19 IU/L (ref 0–40)
Albumin: 4.8 g/dL (ref 3.8–4.9)
Alkaline Phosphatase: 124 IU/L — ABNORMAL HIGH (ref 44–121)
BUN/Creatinine Ratio: 19 (ref 9–23)
BUN: 22 mg/dL (ref 6–24)
Bilirubin Total: 0.7 mg/dL (ref 0.0–1.2)
CO2: 15 mmol/L — ABNORMAL LOW (ref 20–29)
Calcium: 10.2 mg/dL (ref 8.7–10.2)
Chloride: 104 mmol/L (ref 96–106)
Creatinine, Ser: 1.18 mg/dL — ABNORMAL HIGH (ref 0.57–1.00)
Globulin, Total: 2.1 g/dL (ref 1.5–4.5)
Glucose: 158 mg/dL — ABNORMAL HIGH (ref 70–99)
Potassium: 4.7 mmol/L (ref 3.5–5.2)
Sodium: 140 mmol/L (ref 134–144)
Total Protein: 6.9 g/dL (ref 6.0–8.5)
eGFR: 54 mL/min/{1.73_m2} — ABNORMAL LOW (ref >59)

## 2023-09-19 LAB — HEMOGLOBIN A1C
Est. average glucose Bld gHb Est-mCnc: 143 mg/dL
Hgb A1c MFr Bld: 6.6 % — ABNORMAL HIGH (ref 4.8–5.6)

## 2023-09-20 LAB — LIPID PANEL
Chol/HDL Ratio: 3.5 ratio (ref 0.0–4.4)
Cholesterol, Total: 205 mg/dL — ABNORMAL HIGH (ref 100–199)
HDL: 59 mg/dL (ref >39)
LDL Chol Calc (NIH): 111 mg/dL — ABNORMAL HIGH (ref 0–99)
Triglycerides: 202 mg/dL — ABNORMAL HIGH (ref 0–149)
VLDL Cholesterol Cal: 35 mg/dL (ref 5–40)

## 2023-09-23 ENCOUNTER — Ambulatory Visit: Payer: Self-pay | Admitting: Nurse Practitioner

## 2023-09-29 ENCOUNTER — Other Ambulatory Visit (HOSPITAL_COMMUNITY): Payer: Self-pay

## 2023-10-01 ENCOUNTER — Other Ambulatory Visit (HOSPITAL_COMMUNITY): Payer: Self-pay

## 2023-11-05 ENCOUNTER — Other Ambulatory Visit (HOSPITAL_COMMUNITY): Payer: Self-pay

## 2023-11-19 ENCOUNTER — Other Ambulatory Visit (HOSPITAL_COMMUNITY): Payer: Self-pay

## 2023-12-29 ENCOUNTER — Other Ambulatory Visit (HOSPITAL_COMMUNITY): Payer: Self-pay

## 2023-12-30 ENCOUNTER — Other Ambulatory Visit (HOSPITAL_COMMUNITY): Payer: Self-pay

## 2023-12-30 ENCOUNTER — Other Ambulatory Visit: Payer: Self-pay

## 2024-01-08 ENCOUNTER — Ambulatory Visit: Admitting: Obstetrics and Gynecology

## 2024-02-09 ENCOUNTER — Ambulatory Visit: Attending: Hematology and Oncology

## 2024-02-09 VITALS — Wt 188.1 lb

## 2024-02-09 DIAGNOSIS — Z483 Aftercare following surgery for neoplasm: Secondary | ICD-10-CM | POA: Insufficient documentation

## 2024-02-09 NOTE — Therapy (Signed)
 OUTPATIENT PHYSICAL THERAPY SOZO SCREENING NOTE   Patient Name: Genisis Sonnier MRN: [id number] DOB:1965/06/05, 58 y.o., female Today's Date: 02/09/2024  PCP: Oris Camie BRAVO, NP REFERRING PROVIDER: Odean Potts, MD   PT End of Session - 02/09/24 1026     Visit Number 4   # unchanged due to screen only   PT Start Time 1025    PT Stop Time 1029    PT Time Calculation (min) 4 min    Activity Tolerance Patient tolerated treatment well    Behavior During Therapy [facility] for tasks assessed/performed          Past Medical History:  Diagnosis Date   Abnormal mammogram of left breast 09/26/2017   Abnormal mammogram of left breast 09/26/2017   Abnormal Pap smear of cervix    Anxiety    Bilateral ovarian cysts 05/23/2014   Breast cancer (HCC)    Cancer (HCC)    Depression    Diabetes mellitus without complication (HCC)    type 2   Family history of breast cancer 04/11/2020   Family history of breast cancer 04/11/2020   Family history of prostate cancer 04/11/2020   Family history of prostate cancer 04/11/2020   Family history of thyroid  cancer 04/11/2020   Family history of thyroid  cancer 04/11/2020   Fracture dislocation of right ankle joint 06/04/2013   Fracture of distal fibula 06/04/2013   Fracture of distal fibula 06/04/2013   H/O bilateral salpingectomy 05/2015   History of kidney stones    passed stones, no surgery required   Hypercholesteremia    Hypertension    Personal history of radiation therapy    Polycystic ovary disease    PONV (postoperative nausea and vomiting)    Sleep apnea 05/2017   uses cpap   SVD (spontaneous vaginal delivery)    x 1   Past Surgical History:  Procedure Laterality Date   BREAST BIOPSY Right 02/29/2020   BREAST EXCISIONAL BIOPSY Left 2019   Benign   BREAST LUMPECTOMY     BREAST LUMPECTOMY WITH RADIOACTIVE SEED AND SENTINEL LYMPH NODE BIOPSY Right 04/20/2020   Procedure: RIGHT BREAST LUMPECTOMY WITH RADIOACTIVE SEED AND  SENTINEL LYMPH NODE BIOPSY;  Surgeon: Curvin Deward MOULD, MD;  Location: [facility] OR;  Service: General;  Laterality: Right;   BREAST LUMPECTOMY WITH RADIOACTIVE SEED LOCALIZATION Left 09/26/2017   Procedure: LEFT BREAST LUMPECTOMY WITH RADIOACTIVE SEED LOCALIZATION ERAS PATHWAY;  Surgeon: Gail Favorite, MD;  Location: [facility];  Service: General;  Laterality: Left;   BREAST LUMPECTOMY WITH RADIOACTIVE SEED LOCALIZATION Right 03/29/2021   Procedure: RIGHT BREAST LUMPECTOMY WITH RADIOACTIVE SEED LOCALIZATION;  Surgeon: Curvin Deward MOULD, MD;  Location: [facility] OR;  Service: General;  Laterality: Right;   CARPAL TUNNEL RELEASE     bilateral   CERVICAL CERCLAGE  07/1991; 05/1999   x 2   CESAREAN SECTION     x 1   CHOLECYSTECTOMY, LAPAROSCOPIC  1996   COLONOSCOPY WITH PROPOFOL  N/A 06/24/2017   Procedure: COLONOSCOPY WITH PROPOFOL ;  Surgeon: Kristie Lamprey, MD;  Location: [facility];  Service: Endoscopy;  Laterality: N/A;   COLONOSCOPY WITH PROPOFOL  N/A 11/29/2022   Procedure: COLONOSCOPY WITH PROPOFOL ;  Surgeon: Rollin Dover, MD;  Location: [facility];  Service: Gastroenterology;  Laterality: N/A;   CYSTOSCOPY N/A 06/07/2014   Procedure: CYSTOSCOPY;  Surgeon: Ronal GORMAN Pinal, MD;  Location: [facility];  Service: Gynecology;  Laterality: N/A;   LAPAROSCOPIC BILATERAL SALPINGO OOPHERECTOMY Right 06/07/2014   Procedure: LAPAROSCOPIC RIGHT SALPINGO OOPHORECTOMY/COLLECTION OF  PELVIC WASHINGS;  Surgeon: Ronal GORMAN Pinal, MD;  Location: [facility];  Service: Gynecology;  Laterality: Right;   LAPAROSCOPIC UNILATERAL SALPINGECTOMY Left 06/07/2014   Procedure: LAPAROSCOPIC UNILATERAL SALPINGECTOMY;  Surgeon: Ronal GORMAN Pinal, MD;  Location: [facility];  Service: Gynecology;  Laterality: Left;   ORIF ANKLE FRACTURE Right 06/07/2013   Procedure: OPEN REDUCTION INTERNAL FIXATION (ORIF) ANKLE FRACTURE  FIBULA SYNDESMOSIS;  Surgeon: Toribio JULIANNA Chancy, MD;  Location: [facility];  Service: Orthopedics;  Laterality:  Right;   POLYPECTOMY  11/29/2022   Procedure: POLYPECTOMY;  Surgeon: Rollin Dover, MD;  Location: [facility];  Service: Gastroenterology;;   RE-EXCISION OF BREAST CANCER,SUPERIOR MARGINS Right 06/01/2020   Procedure: RIGHT BREAST MARGIN REEXCISION;  Surgeon: Curvin Deward MOULD, MD;  Location: [facility] OR;  Service: General;  Laterality: Right;   TRIGGER FINGER RELEASE Right 01/30/2021   Patient Active Problem List   Diagnosis Date Noted   Restless legs syndrome 09/18/2023   Papule of skin 09/18/2023   Cubital tunnel syndrome 04/01/2023   Night sweats 04/01/2023   Cellulitis of toe of left foot 02/18/2023   Type 2 diabetes mellitus with hyperglycemia, without long-term current use of insulin (HCC) 08/09/2022   Hyperlipidemia associated with type 2 diabetes mellitus (HCC) 08/09/2022   Allergic rhinitis 11/08/2020   Exercise induced bronchospasm 11/08/2020   Encounter for annual physical exam 04/17/2020   Malignant neoplasm of upper-outer quadrant of right breast in female, estrogen receptor positive (HCC) 03/21/2020   Hypertension associated with diabetes (HCC) 04/07/2013   PCOS (polycystic ovarian syndrome) 04/07/2013   Adjustment disorder with mixed anxiety and depressed mood 04/07/2013    REFERRING DIAG: right breast cancer at risk for lymphedema  THERAPY DIAG: Aftercare following surgery for neoplasm  PERTINENT HISTORY: right breast cancer diagnosed with bio Pt had reexcision on June 01, 2020.  biopsy on 02/29/20 showed invasive ductal carcinoma with DCIS, grade 1, HER-2 negative (1+), ER/PR >95%, Ki 67 5%. Surgery was 04/20/2020 and she will have radiation.  Past history includes, DM, sleep apnea, bilaterl carpal tunnel surgery( 2006) and right trigger finger surgery ( 2011) /  PRECAUTIONS: right UE Lymphedema risk, None  SUBJECTIVE: Pt returns for her last 6 month L-Dex screen.   PAIN:  Are you having pain? No  SOZO SCREENING: Patient was assessed today using the SOZO machine to  determine the lymphedema index score. This was compared to her baseline score. It was determined that she is within the recommended range when compared to her baseline and no further action is needed at this time. She will continue SOZO screenings. These are done every 3 months for 2 years post operatively followed by every 6 months for 2 years, and then annually.   L-DEX FLOWSHEETS - 02/09/24 1000       L-DEX LYMPHEDEMA SCREENING   Measurement Type Unilateral    L-DEX MEASUREMENT EXTREMITY Upper Extremity    POSITION  Standing    DOMINANT SIDE Right    At Risk Side Right    BASELINE SCORE (UNILATERAL) 1.8    L-DEX SCORE (UNILATERAL) 0.6    VALUE CHANGE (UNILAT) -1.2         P: Pt to transition to annual SOZO screens.  Aden Berwyn Caldron, PTA 02/09/2024, 10:27 AM

## 2024-03-22 ENCOUNTER — Ambulatory Visit: Payer: Self-pay | Admitting: Nurse Practitioner

## 2024-03-22 ENCOUNTER — Encounter: Payer: Self-pay | Admitting: Nurse Practitioner

## 2024-03-22 ENCOUNTER — Other Ambulatory Visit (HOSPITAL_COMMUNITY): Payer: Self-pay

## 2024-03-22 VITALS — BP 98/72 | HR 86 | Wt 185.9 lb

## 2024-03-22 DIAGNOSIS — R41840 Attention and concentration deficit: Secondary | ICD-10-CM | POA: Diagnosis not present

## 2024-03-22 DIAGNOSIS — F4323 Adjustment disorder with mixed anxiety and depressed mood: Secondary | ICD-10-CM | POA: Diagnosis not present

## 2024-03-22 DIAGNOSIS — E1169 Type 2 diabetes mellitus with other specified complication: Secondary | ICD-10-CM

## 2024-03-22 DIAGNOSIS — E785 Hyperlipidemia, unspecified: Secondary | ICD-10-CM | POA: Diagnosis not present

## 2024-03-22 DIAGNOSIS — G2581 Restless legs syndrome: Secondary | ICD-10-CM | POA: Diagnosis not present

## 2024-03-22 DIAGNOSIS — Z1159 Encounter for screening for other viral diseases: Secondary | ICD-10-CM

## 2024-03-22 DIAGNOSIS — E1159 Type 2 diabetes mellitus with other circulatory complications: Secondary | ICD-10-CM | POA: Diagnosis not present

## 2024-03-22 DIAGNOSIS — I152 Hypertension secondary to endocrine disorders: Secondary | ICD-10-CM | POA: Diagnosis not present

## 2024-03-22 DIAGNOSIS — E1165 Type 2 diabetes mellitus with hyperglycemia: Secondary | ICD-10-CM

## 2024-03-22 DIAGNOSIS — Z23 Encounter for immunization: Secondary | ICD-10-CM | POA: Diagnosis not present

## 2024-03-22 LAB — LIPID PANEL

## 2024-03-22 MED ORDER — TIRZEPATIDE 5 MG/0.5ML ~~LOC~~ SOAJ
5.0000 mg | SUBCUTANEOUS | 0 refills | Status: AC
  Filled 2024-03-22: qty 2, 28d supply, fill #0

## 2024-03-22 MED ORDER — TIRZEPATIDE 10 MG/0.5ML ~~LOC~~ SOAJ
10.0000 mg | SUBCUTANEOUS | 0 refills | Status: AC
  Filled 2024-03-22: qty 2, 28d supply, fill #0

## 2024-03-22 MED ORDER — TIRZEPATIDE 7.5 MG/0.5ML ~~LOC~~ SOAJ
7.5000 mg | SUBCUTANEOUS | 0 refills | Status: AC
  Filled 2024-03-22 – 2024-04-14 (×2): qty 2, 28d supply, fill #0

## 2024-03-22 MED ORDER — EMPAGLIFLOZIN 10 MG PO TABS
10.0000 mg | ORAL_TABLET | Freq: Every day | ORAL | 3 refills | Status: AC
  Filled 2024-03-22: qty 90, 90d supply, fill #0

## 2024-03-22 MED ORDER — CITALOPRAM HYDROBROMIDE 20 MG PO TABS
30.0000 mg | ORAL_TABLET | Freq: Every day | ORAL | 3 refills | Status: AC
  Filled 2024-03-22: qty 90, 60d supply, fill #0

## 2024-03-22 MED ORDER — GABAPENTIN 300 MG PO CAPS
300.0000 mg | ORAL_CAPSULE | Freq: Two times a day (BID) | ORAL | 3 refills | Status: AC | PRN
  Filled 2024-03-22 – 2024-04-14 (×2): qty 180, 90d supply, fill #0

## 2024-03-22 MED ORDER — METOPROLOL TARTRATE 100 MG PO TABS
100.0000 mg | ORAL_TABLET | Freq: Two times a day (BID) | ORAL | 3 refills | Status: AC
  Filled 2024-03-22: qty 180, 90d supply, fill #0

## 2024-03-22 MED ORDER — SPIRONOLACTONE 100 MG PO TABS
100.0000 mg | ORAL_TABLET | Freq: Two times a day (BID) | ORAL | 3 refills | Status: AC
  Filled 2024-03-22: qty 180, 90d supply, fill #0

## 2024-03-22 MED ORDER — ATORVASTATIN CALCIUM 40 MG PO TABS
40.0000 mg | ORAL_TABLET | Freq: Every evening | ORAL | 3 refills | Status: AC
  Filled 2024-03-22: qty 90, 90d supply, fill #0

## 2024-03-22 MED ORDER — BUPROPION HCL ER (XL) 150 MG PO TB24
150.0000 mg | ORAL_TABLET | Freq: Every day | ORAL | 3 refills | Status: AC
  Filled 2024-03-22: qty 90, 90d supply, fill #0

## 2024-03-22 MED ORDER — BUPROPION HCL ER (XL) 300 MG PO TB24
300.0000 mg | ORAL_TABLET | Freq: Every day | ORAL | 3 refills | Status: AC
  Filled 2024-03-22: qty 90, 90d supply, fill #0

## 2024-03-22 NOTE — Assessment & Plan Note (Addendum)
 Type 2 diabetes mellitus with hyperglycemia and associated hypertension and hyperlipidemia Blood sugars are likely under good control given that she is not experiencing UTI or yeast symptoms, but we will check this today. Blood pressure is well-controlled, a little on the lower side, but no symptoms. No weight loss with current Ozempic  regimen. We discussed the option to change to Mounjaro  to see if we can help with this factor and she is interested in that. Will start at 5mg  dose and plan to increase by 2.5mg  every 4 weeks as tolerated. Exercise recommended to aid in blood sugar, weight, lipids, and blood pressure.  - Switched from Ozempic  to Mounjaro  starting at 5 mg, with dose escalation every four weeks as tolerated. - Continue Jardiance  every other day. - Checked blood sugars today. - Continue spironolactone  100 mg twice a day. - Continue metoprolol  100 mg twice a day. Orders:   CBC with Differential/Platelet   Hemoglobin A1c   Comprehensive metabolic panel with GFR   Lipid panel   atorvastatin  (LIPITOR) 40 MG tablet; Take 1 tablet (40 mg total) by mouth at bedtime.   empagliflozin  (JARDIANCE ) 10 MG TABS tablet; Take 1 tablet (10 mg total) by mouth daily.   tirzepatide  (MOUNJARO ) 7.5 MG/0.5ML Pen; Inject 7.5 mg into the skin once a week. After 4 weeks increase to 10mg    tirzepatide  (MOUNJARO ) 5 MG/0.5ML Pen; Inject 5 mg into the skin once a week. After 4 weeks increase to 7.5mg    tirzepatide  (MOUNJARO ) 10 MG/0.5ML Pen; Inject 10 mg into the skin once a week. After 4 weeks increase to 12.5mg  if tolerating well.   spironolactone  (ALDACTONE ) 100 MG tablet; Take 1 tablet (100 mg total) by mouth 2 (two) times daily.   metoprolol  tartrate (LOPRESSOR ) 100 MG tablet; Take 1 tablet (100 mg total) by mouth 2 (two) times daily.

## 2024-03-22 NOTE — Assessment & Plan Note (Addendum)
 Type 2 diabetes mellitus with hyperglycemia and associated hypertension and hyperlipidemia Blood sugars are likely under good control given that she is not experiencing UTI or yeast symptoms, but we will check this today. Blood pressure is well-controlled, a little on the lower side, but no symptoms. No weight loss with current Ozempic  regimen. We discussed the option to change to Mounjaro  to see if we can help with this factor and she is interested in that. Will start at 5mg  dose and plan to increase by 2.5mg  every 4 weeks as tolerated. Exercise recommended to aid in blood sugar, weight, lipids, and blood pressure.  - Switched from Ozempic  to Mounjaro  starting at 5 mg, with dose escalation every four weeks as tolerated. - Continue Jardiance  every other day. - Checked blood sugars today. - Continue spironolactone  100 mg twice a day. - Continue metoprolol  100 mg twice a day. Orders:   CBC with Differential/Platelet   Hemoglobin A1c   Comprehensive metabolic panel with GFR   Lipid panel   Microalbumin/Creatinine Ratio, Urine   atorvastatin  (LIPITOR) 40 MG tablet; Take 1 tablet (40 mg total) by mouth at bedtime.   empagliflozin  (JARDIANCE ) 10 MG TABS tablet; Take 1 tablet (10 mg total) by mouth daily.   tirzepatide  (MOUNJARO ) 7.5 MG/0.5ML Pen; Inject 7.5 mg into the skin once a week. After 4 weeks increase to 10mg    tirzepatide  (MOUNJARO ) 5 MG/0.5ML Pen; Inject 5 mg into the skin once a week. After 4 weeks increase to 7.5mg    tirzepatide  (MOUNJARO ) 10 MG/0.5ML Pen; Inject 10 mg into the skin once a week. After 4 weeks increase to 12.5mg  if tolerating well.   spironolactone  (ALDACTONE ) 100 MG tablet; Take 1 tablet (100 mg total) by mouth 2 (two) times daily.   metoprolol  tartrate (LOPRESSOR ) 100 MG tablet; Take 1 tablet (100 mg total) by mouth 2 (two) times daily.

## 2024-03-22 NOTE — Assessment & Plan Note (Signed)
 Symptoms of difficulty with task management, focus, attention, concentration, and motivation. Recently her two daughters were diagnosed with ADHD and Autism spectrum D/O. Her symptoms do correlate with many symptoms associated with ADHD. We discussed that the loss of estrogen with menopause and anastrozole  may be a contributor that is causing these symptoms to be worse at this time. I do feel that formal evaluation is appropriate to ensure that this is the proper diagnosis and no alternative management is required. Her depression symptoms are also exacerbated at the current time, which we discussed could be due to her current stressors and the uncontrolled attention/focus. I am hopeful that psychiatry will be able to help give us  a clear picture on the best diagnosis and management.  - Referral for psychiatry for further evaluation.

## 2024-03-22 NOTE — Patient Instructions (Addendum)
 I do recommend the 2nd dose of the shingles vaccines in the next few weeks. I would give a couple of weeks time between the COVID and Flu vaccines so your body can mount an appropriate response.  Do this on a day that you can rest the following day, as sometimes the shingles vaccines can make people feel tired and achy the following day.   I have increased the citalopram  to 30mg  (1.5 tabs) a day.  I have sent the referral for ADHD screening and requested autism and depression screening, as well. If this will take a while to get in, we can try a low dose of Adderall to see if this helps with the symptoms you are experiencing, but I would like to see if we can get the testing completed first.   I refilled all of your current medications.   We have changed the Ozempic  to Mounjaro . We will see if this is working any better for you.

## 2024-03-22 NOTE — Assessment & Plan Note (Signed)
 Managed with gabapentin  300mg  BID. Symptoms are well controlled on the current dose with no concerns at this time.  Orders:   gabapentin  (NEURONTIN ) 300 MG capsule; Take 1 capsule (300 mg total) by mouth 2 (two) times daily as needed for pain, burning and numbness

## 2024-03-22 NOTE — Progress Notes (Signed)
 "  Catheline Doing, DNP, AGNP-c [facility]  [address] [place], [place] [address] [phone]   ESTABLISHED PATIENT- Chronic Health and/or Follow-Up Visit on 03/22/2024  Blood pressure 98/72, pulse 86, weight 185 lb 14.4 oz (84.3 kg), last menstrual period 12/31/2015.   Subjective:  other (6 month med check, fasting labs, Dr. [facility] exam [facility], due for DEXA but order by cancer doctor lats time, also due for 2nd shingles but rather get covid and flu today. )   History of Present Illness Nicole Khan Geanie Pacifico is a 58 year old female with diabetes, hypertension, and hyperlipidemia who presents for a six-month follow-up.  She is not regularly monitoring her blood glucose levels but continues to take Jardiance  every other day and Ozempic . Despite this, she has not experienced weight loss and continues to feel hungry. She is not experiencing any UTI or yeast symptoms on the every other day dosing of the Jardiance .   Her current medications also include spironolactone  100 mg twice daily and metoprolol  100 mg twice daily. She reports no chest pain, dizziness, or shortness of breath. Her BP is well controlled.   She is utilizing gabapentin  daily for hot flashes and restless leg, which has helped with these symptoms significantly.   She is experiencing increased difficulty with focus and memory, describing it as 'brain fog.'  She is concerned about potential ADHD, as both her daughters have been diagnosed with autism and ADHD and have told her that they feel she has shared similar symptoms. She is currently on Wellbutrin  450 mg and citalopram  20mg , which we discussed increasing.   She has a history of depression and anxiety, which she feels are exacerbated and notes difficulty getting out of bed, often staying in bed until noon. She has experienced significant life stressors, including the death of her best friend this summer.  She has also been helping her  daughters through their recent diagnoses of autism and ADHD. Her younger daughter also has Ehlers-Danlos syndrome, POTS, mast cell activation syndrome, and fibromyalgia. She is actively involved in her daughter's care, managing her medical appointments and treatments, which has added stressors.   She does not engage in regular exercise but plans to use a recumbent bike or rowing machine. She has received her influenza and COVID vaccines today and is due for her second shingles vaccine in a few weeks.  ROS negative except for what is listed in HPI. History, Medications, Surgery, SDOH, and Family History reviewed and updated as appropriate.  Objective:  Physical Exam Vitals and nursing note reviewed.  Constitutional:      Appearance: Normal appearance.  HENT:     Head: Normocephalic.  Eyes:     Pupils: Pupils are equal, round, and reactive to light.  Cardiovascular:     Rate and Rhythm: Normal rate and regular rhythm.     Pulses: Normal pulses.     Heart sounds: Normal heart sounds.  Pulmonary:     Effort: Pulmonary effort is normal.     Breath sounds: Normal breath sounds.  Musculoskeletal:        General: Normal range of motion.     Cervical back: Normal range of motion.  Skin:    General: Skin is warm.  Neurological:     General: No focal deficit present.     Mental Status: She is alert and oriented to person, place, and time.  Psychiatric:        Mood and Affect: Mood normal.  Assessment & Plan:   Assessment & Plan Type 2 diabetes mellitus with hyperglycemia, without long-term current use of insulin (HCC) Type 2 diabetes mellitus with hyperglycemia and associated hypertension and hyperlipidemia Blood sugars are likely under good control given that she is not experiencing UTI or yeast symptoms, but we will check this today. Blood pressure is well-controlled, a little on the lower side, but no symptoms. No weight loss with current Ozempic  regimen. We discussed the  option to change to Mounjaro  to see if we can help with this factor and she is interested in that. Will start at 5mg  dose and plan to increase by 2.5mg  every 4 weeks as tolerated. Exercise recommended to aid in blood sugar, weight, lipids, and blood pressure.  - Switched from Ozempic  to Mounjaro  starting at 5 mg, with dose escalation every four weeks as tolerated. - Continue Jardiance  every other day. - Checked blood sugars today. - Continue spironolactone  100 mg twice a day. - Continue metoprolol  100 mg twice a day. Orders:   CBC with Differential/Platelet   Hemoglobin A1c   Comprehensive metabolic panel with GFR   Lipid panel   Microalbumin/Creatinine Ratio, Urine   atorvastatin  (LIPITOR) 40 MG tablet; Take 1 tablet (40 mg total) by mouth at bedtime.   empagliflozin  (JARDIANCE ) 10 MG TABS tablet; Take 1 tablet (10 mg total) by mouth daily.   tirzepatide  (MOUNJARO ) 7.5 MG/0.5ML Pen; Inject 7.5 mg into the skin once a week. After 4 weeks increase to 10mg    tirzepatide  (MOUNJARO ) 5 MG/0.5ML Pen; Inject 5 mg into the skin once a week. After 4 weeks increase to 7.5mg    tirzepatide  (MOUNJARO ) 10 MG/0.5ML Pen; Inject 10 mg into the skin once a week. After 4 weeks increase to 12.5mg  if tolerating well.   spironolactone  (ALDACTONE ) 100 MG tablet; Take 1 tablet (100 mg total) by mouth 2 (two) times daily.   metoprolol  tartrate (LOPRESSOR ) 100 MG tablet; Take 1 tablet (100 mg total) by mouth 2 (two) times daily.  Hypertension associated with diabetes (HCC) Type 2 diabetes mellitus with hyperglycemia and associated hypertension and hyperlipidemia Blood sugars are likely under good control given that she is not experiencing UTI or yeast symptoms, but we will check this today. Blood pressure is well-controlled, a little on the lower side, but no symptoms. No weight loss with current Ozempic  regimen. We discussed the option to change to Mounjaro  to see if we can help with this factor and she is interested  in that. Will start at 5mg  dose and plan to increase by 2.5mg  every 4 weeks as tolerated. Exercise recommended to aid in blood sugar, weight, lipids, and blood pressure.  - Switched from Ozempic  to Mounjaro  starting at 5 mg, with dose escalation every four weeks as tolerated. - Continue Jardiance  every other day. - Checked blood sugars today. - Continue spironolactone  100 mg twice a day. - Continue metoprolol  100 mg twice a day. Orders:   CBC with Differential/Platelet   Hemoglobin A1c   Comprehensive metabolic panel with GFR   Lipid panel   atorvastatin  (LIPITOR) 40 MG tablet; Take 1 tablet (40 mg total) by mouth at bedtime.   empagliflozin  (JARDIANCE ) 10 MG TABS tablet; Take 1 tablet (10 mg total) by mouth daily.   tirzepatide  (MOUNJARO ) 7.5 MG/0.5ML Pen; Inject 7.5 mg into the skin once a week. After 4 weeks increase to 10mg    tirzepatide  (MOUNJARO ) 5 MG/0.5ML Pen; Inject 5 mg into the skin once a week. After 4 weeks increase to 7.5mg   tirzepatide  (MOUNJARO ) 10 MG/0.5ML Pen; Inject 10 mg into the skin once a week. After 4 weeks increase to 12.5mg  if tolerating well.   spironolactone  (ALDACTONE ) 100 MG tablet; Take 1 tablet (100 mg total) by mouth 2 (two) times daily.   metoprolol  tartrate (LOPRESSOR ) 100 MG tablet; Take 1 tablet (100 mg total) by mouth 2 (two) times daily.  Hyperlipidemia associated with type 2 diabetes mellitus (HCC) Type 2 diabetes mellitus with hyperglycemia and associated hypertension and hyperlipidemia Blood sugars are likely under good control given that she is not experiencing UTI or yeast symptoms, but we will check this today. Blood pressure is well-controlled, a little on the lower side, but no symptoms. No weight loss with current Ozempic  regimen. We discussed the option to change to Mounjaro  to see if we can help with this factor and she is interested in that. Will start at 5mg  dose and plan to increase by 2.5mg  every 4 weeks as tolerated. Exercise recommended to  aid in blood sugar, weight, lipids, and blood pressure.  - Switched from Ozempic  to Mounjaro  starting at 5 mg, with dose escalation every four weeks as tolerated. - Continue Jardiance  every other day. - Checked blood sugars today. - Continue spironolactone  100 mg twice a day. - Continue metoprolol  100 mg twice a day. Orders:   CBC with Differential/Platelet   Hemoglobin A1c   Comprehensive metabolic panel with GFR   Lipid panel   atorvastatin  (LIPITOR) 40 MG tablet; Take 1 tablet (40 mg total) by mouth at bedtime.   empagliflozin  (JARDIANCE ) 10 MG TABS tablet; Take 1 tablet (10 mg total) by mouth daily.   tirzepatide  (MOUNJARO ) 7.5 MG/0.5ML Pen; Inject 7.5 mg into the skin once a week. After 4 weeks increase to 10mg    tirzepatide  (MOUNJARO ) 5 MG/0.5ML Pen; Inject 5 mg into the skin once a week. After 4 weeks increase to 7.5mg    metoprolol  tartrate (LOPRESSOR ) 100 MG tablet; Take 1 tablet (100 mg total) by mouth 2 (two) times daily.  Attention and concentration deficit Symptoms of difficulty with task management, focus, attention, concentration, and motivation. Recently her two daughters were diagnosed with ADHD and Autism spectrum D/O. Her symptoms do correlate with many symptoms associated with ADHD. We discussed that the loss of estrogen with menopause and anastrozole  may be a contributor that is causing these symptoms to be worse at this time. I do feel that formal evaluation is appropriate to ensure that this is the proper diagnosis and no alternative management is required. Her depression symptoms are also exacerbated at the current time, which we discussed could be due to her current stressors and the uncontrolled attention/focus. I am hopeful that psychiatry will be able to help give us  a clear picture on the best diagnosis and management.  - Referral for psychiatry for further evaluation.     Adjustment disorder with mixed anxiety and depressed mood Mood is generally reported as  okay, but difficulty getting out of bed and dealing with life stressors are present. Current Wellbutrin  dose is 450 mg and Citalopram  20mg . We discussed increasing citalopram  to address mood symptoms, which she agrees to. Will also send a referral for evaluation for ADHD and Autism spectrum disorder for her to psychiatry. We discussed that often when one condition is not well controlled, others can fall out of alignment, as well, causing an exacerbation. Discussed potential trial of ADHD medication if specialist assessment is delayed. No alarm symptoms are present at this time.  - Increased citalopram  to 30 mg daily. -  Referred to attention specialists for ADHD and autism spectrum assessment. - Will consider trial of low-dose Adderall if specialist assessment is delayed. - Will schedule virtual follow-up in a couple of months to assess mood and medication efficacy. Orders:   Ambulatory referral to Psychiatry   citalopram  (CELEXA ) 20 MG tablet; Take 1 & 1/2 tablets (30 mg total) by mouth daily.   buPROPion  (WELLBUTRIN  XL) 300 MG 24 hr tablet; Take 1 tablet (300 mg total) by mouth daily with 1 tablet (150 mg) tablet daily for full dose of 450mg .   buPROPion  (WELLBUTRIN  XL) 150 MG 24 hr tablet; Take 1 tablet (150 mg total) by mouth daily with 1 tablet (300 mg) daily for full dose of 450mg .  Restless legs syndrome Managed with gabapentin  300mg  BID. Symptoms are well controlled on the current dose with no concerns at this time.  Orders:   gabapentin  (NEURONTIN ) 300 MG capsule; Take 1 capsule (300 mg total) by mouth 2 (two) times daily as needed for pain, burning and numbness  Encounter for hepatitis C screening test for low risk patient  Orders:   Hepatitis C antibody  Need for COVID-19 vaccine  Orders:   Pfizer Comirnaty  Covid-19 Vaccine 68yrs & older  Need for influenza vaccination  Orders:   Flu vaccine trivalent PF, 6mos and older(Flulaval,Afluria,Fluarix,Fluzone)    Camie FORBES Doing,  DNP, AGNP-c    "

## 2024-03-22 NOTE — Assessment & Plan Note (Signed)
 Mood is generally reported as okay, but difficulty getting out of bed and dealing with life stressors are present. Current Wellbutrin  dose is 450 mg and Citalopram  20mg . We discussed increasing citalopram  to address mood symptoms, which she agrees to. Will also send a referral for evaluation for ADHD and Autism spectrum disorder for her to psychiatry. We discussed that often when one condition is not well controlled, others can fall out of alignment, as well, causing an exacerbation. Discussed potential trial of ADHD medication if specialist assessment is delayed. No alarm symptoms are present at this time.  - Increased citalopram  to 30 mg daily. - Referred to attention specialists for ADHD and autism spectrum assessment. - Will consider trial of low-dose Adderall if specialist assessment is delayed. - Will schedule virtual follow-up in a couple of months to assess mood and medication efficacy. Orders:   Ambulatory referral to Psychiatry   citalopram  (CELEXA ) 20 MG tablet; Take 1 & 1/2 tablets (30 mg total) by mouth daily.   buPROPion  (WELLBUTRIN  XL) 300 MG 24 hr tablet; Take 1 tablet (300 mg total) by mouth daily with 1 tablet (150 mg) tablet daily for full dose of 450mg .   buPROPion  (WELLBUTRIN  XL) 150 MG 24 hr tablet; Take 1 tablet (150 mg total) by mouth daily with 1 tablet (300 mg) daily for full dose of 450mg .

## 2024-03-22 NOTE — Assessment & Plan Note (Addendum)
 Type 2 diabetes mellitus with hyperglycemia and associated hypertension and hyperlipidemia Blood sugars are likely under good control given that she is not experiencing UTI or yeast symptoms, but we will check this today. Blood pressure is well-controlled, a little on the lower side, but no symptoms. No weight loss with current Ozempic  regimen. We discussed the option to change to Mounjaro  to see if we can help with this factor and she is interested in that. Will start at 5mg  dose and plan to increase by 2.5mg  every 4 weeks as tolerated. Exercise recommended to aid in blood sugar, weight, lipids, and blood pressure.  - Switched from Ozempic  to Mounjaro  starting at 5 mg, with dose escalation every four weeks as tolerated. - Continue Jardiance  every other day. - Checked blood sugars today. - Continue spironolactone  100 mg twice a day. - Continue metoprolol  100 mg twice a day. Orders:   CBC with Differential/Platelet   Hemoglobin A1c   Comprehensive metabolic panel with GFR   Lipid panel   atorvastatin  (LIPITOR) 40 MG tablet; Take 1 tablet (40 mg total) by mouth at bedtime.   empagliflozin  (JARDIANCE ) 10 MG TABS tablet; Take 1 tablet (10 mg total) by mouth daily.   tirzepatide  (MOUNJARO ) 7.5 MG/0.5ML Pen; Inject 7.5 mg into the skin once a week. After 4 weeks increase to 10mg    tirzepatide  (MOUNJARO ) 5 MG/0.5ML Pen; Inject 5 mg into the skin once a week. After 4 weeks increase to 7.5mg    metoprolol  tartrate (LOPRESSOR ) 100 MG tablet; Take 1 tablet (100 mg total) by mouth 2 (two) times daily.

## 2024-03-23 LAB — LIPID PANEL
Cholesterol, Total: 148 mg/dL (ref 100–199)
HDL: 58 mg/dL
LDL CALC COMMENT:: 2.6 ratio (ref 0.0–4.4)
LDL Chol Calc (NIH): 69 mg/dL (ref 0–99)
Triglycerides: 120 mg/dL (ref 0–149)
VLDL Cholesterol Cal: 21 mg/dL (ref 5–40)

## 2024-03-23 LAB — CBC WITH DIFFERENTIAL/PLATELET
Basophils Absolute: 0 x10E3/uL (ref 0.0–0.2)
Basos: 1 %
EOS (ABSOLUTE): 0.1 x10E3/uL (ref 0.0–0.4)
Eos: 1 %
Hematocrit: 47.8 % — ABNORMAL HIGH (ref 34.0–46.6)
Hemoglobin: 15.8 g/dL (ref 11.1–15.9)
Immature Grans (Abs): 0 x10E3/uL (ref 0.0–0.1)
Immature Granulocytes: 0 %
Lymphocytes Absolute: 1.6 x10E3/uL (ref 0.7–3.1)
Lymphs: 25 %
MCH: 32.1 pg (ref 26.6–33.0)
MCHC: 33.1 g/dL (ref 31.5–35.7)
MCV: 97 fL (ref 79–97)
Monocytes Absolute: 0.5 x10E3/uL (ref 0.1–0.9)
Monocytes: 7 %
Neutrophils Absolute: 4.3 x10E3/uL (ref 1.4–7.0)
Neutrophils: 66 %
Platelets: 271 x10E3/uL (ref 150–450)
RBC: 4.92 x10E6/uL (ref 3.77–5.28)
RDW: 13 % (ref 11.7–15.4)
WBC: 6.5 x10E3/uL (ref 3.4–10.8)

## 2024-03-23 LAB — COMPREHENSIVE METABOLIC PANEL WITH GFR
ALT: 23 IU/L (ref 0–32)
AST: 16 IU/L (ref 0–40)
Albumin: 4.5 g/dL (ref 3.8–4.9)
Alkaline Phosphatase: 100 IU/L (ref 49–135)
BUN/Creatinine Ratio: 11 (ref 9–23)
BUN: 12 mg/dL (ref 6–24)
Bilirubin Total: 0.8 mg/dL (ref 0.0–1.2)
CO2: 19 mmol/L — AB (ref 20–29)
Calcium: 9.7 mg/dL (ref 8.7–10.2)
Chloride: 105 mmol/L (ref 96–106)
Creatinine, Ser: 1.13 mg/dL — AB (ref 0.57–1.00)
Globulin, Total: 2.1 g/dL (ref 1.5–4.5)
Glucose: 90 mg/dL (ref 70–99)
Potassium: 4.3 mmol/L (ref 3.5–5.2)
Sodium: 140 mmol/L (ref 134–144)
Total Protein: 6.6 g/dL (ref 6.0–8.5)
eGFR: 57 mL/min/1.73 — AB

## 2024-03-23 LAB — MICROALBUMIN / CREATININE URINE RATIO
Creatinine, Urine: 97.4 mg/dL
Microalb/Creat Ratio: 18 mg/g{creat} (ref 0–29)
Microalbumin, Urine: 17.9 ug/mL

## 2024-03-23 LAB — HEMOGLOBIN A1C
Est. average glucose Bld gHb Est-mCnc: 128 mg/dL
Hgb A1c MFr Bld: 6.1 % — AB (ref 4.8–5.6)

## 2024-03-23 LAB — HEPATITIS C ANTIBODY: Hep C Virus Ab: NONREACTIVE

## 2024-03-31 ENCOUNTER — Ambulatory Visit: Payer: Self-pay | Admitting: Nurse Practitioner

## 2024-03-31 DIAGNOSIS — N1831 Chronic kidney disease, stage 3a: Secondary | ICD-10-CM

## 2024-04-14 ENCOUNTER — Other Ambulatory Visit (HOSPITAL_COMMUNITY): Payer: Self-pay

## 2024-04-23 ENCOUNTER — Other Ambulatory Visit

## 2024-04-23 DIAGNOSIS — Z23 Encounter for immunization: Secondary | ICD-10-CM

## 2024-05-11 ENCOUNTER — Ambulatory Visit: Payer: Managed Care, Other (non HMO) | Admitting: Obstetrics and Gynecology

## 2024-05-19 ENCOUNTER — Inpatient Hospital Stay: Payer: Managed Care, Other (non HMO) | Admitting: Hematology and Oncology

## 2024-05-24 ENCOUNTER — Ambulatory Visit: Admitting: Nurse Practitioner

## 2024-05-28 ENCOUNTER — Ambulatory Visit: Admitting: Nurse Practitioner

## 2025-02-07 ENCOUNTER — Ambulatory Visit
